# Patient Record
Sex: Female | Born: 1945
Health system: Southern US, Community
[De-identification: ages and names within clinical notes are randomized; demographics above are authoritative.]

## PROBLEM LIST (undated history)

## (undated) DIAGNOSIS — E559 Vitamin D deficiency, unspecified: Secondary | ICD-10-CM

## (undated) DIAGNOSIS — E119 Type 2 diabetes mellitus without complications: Secondary | ICD-10-CM

## (undated) DIAGNOSIS — I2510 Atherosclerotic heart disease of native coronary artery without angina pectoris: Secondary | ICD-10-CM

## (undated) DIAGNOSIS — G4733 Obstructive sleep apnea (adult) (pediatric): Secondary | ICD-10-CM

## (undated) DIAGNOSIS — E278 Other specified disorders of adrenal gland: Secondary | ICD-10-CM

## (undated) DIAGNOSIS — F329 Major depressive disorder, single episode, unspecified: Secondary | ICD-10-CM

## (undated) DIAGNOSIS — R0982 Postnasal drip: Secondary | ICD-10-CM

## (undated) DIAGNOSIS — J209 Acute bronchitis, unspecified: Secondary | ICD-10-CM

## (undated) DIAGNOSIS — J309 Allergic rhinitis, unspecified: Secondary | ICD-10-CM

## (undated) DIAGNOSIS — F419 Anxiety disorder, unspecified: Secondary | ICD-10-CM

## (undated) DIAGNOSIS — B0229 Other postherpetic nervous system involvement: Secondary | ICD-10-CM

## (undated) DIAGNOSIS — I10 Essential (primary) hypertension: Secondary | ICD-10-CM

## (undated) DIAGNOSIS — L659 Nonscarring hair loss, unspecified: Secondary | ICD-10-CM

## (undated) DIAGNOSIS — J84112 Idiopathic pulmonary fibrosis: Secondary | ICD-10-CM

## (undated) DIAGNOSIS — M1990 Unspecified osteoarthritis, unspecified site: Secondary | ICD-10-CM

## (undated) DIAGNOSIS — T8859XA Other complications of anesthesia, initial encounter: Secondary | ICD-10-CM

## (undated) DIAGNOSIS — R0609 Other forms of dyspnea: Secondary | ICD-10-CM

## (undated) DIAGNOSIS — M129 Arthropathy, unspecified: Secondary | ICD-10-CM

## (undated) DIAGNOSIS — K219 Gastro-esophageal reflux disease without esophagitis: Secondary | ICD-10-CM

## (undated) DIAGNOSIS — E785 Hyperlipidemia, unspecified: Secondary | ICD-10-CM

## (undated) DIAGNOSIS — J849 Interstitial pulmonary disease, unspecified: Secondary | ICD-10-CM

## (undated) DIAGNOSIS — I2721 Secondary pulmonary arterial hypertension: Secondary | ICD-10-CM

## (undated) DIAGNOSIS — I152 Hypertension secondary to endocrine disorders: Secondary | ICD-10-CM

## (undated) DIAGNOSIS — I700 Atherosclerosis of aorta: Secondary | ICD-10-CM

## (undated) DIAGNOSIS — R0902 Hypoxemia: Secondary | ICD-10-CM

## (undated) DIAGNOSIS — F32A Depression, unspecified: Secondary | ICD-10-CM

## (undated) DIAGNOSIS — E1159 Type 2 diabetes mellitus with other circulatory complications: Secondary | ICD-10-CM

## (undated) HISTORY — DX: Anxiety disorder, unspecified: F41.9

## (undated) HISTORY — DX: Other specified disorders of adrenal gland: E27.8

## (undated) HISTORY — DX: Essential (primary) hypertension: I10

## (undated) HISTORY — DX: Hyperlipidemia, unspecified: E78.5

## (undated) HISTORY — DX: Depression, unspecified: F32.A

## (undated) HISTORY — DX: Obstructive sleep apnea (adult) (pediatric): G47.33

## (undated) HISTORY — DX: Acute bronchitis, unspecified: J20.9

## (undated) HISTORY — DX: Allergic rhinitis, unspecified: J30.9

## (undated) HISTORY — DX: Nonscarring hair loss, unspecified: L65.9

## (undated) HISTORY — DX: Major depressive disorder, single episode, unspecified: F32.9

## (undated) HISTORY — DX: Postnasal drip: R09.82

## (undated) HISTORY — DX: Atherosclerotic heart disease of native coronary artery without angina pectoris: I25.10

## (undated) HISTORY — DX: Vitamin D deficiency, unspecified: E55.9

## (undated) HISTORY — PX: REPLACEMENT TOTAL KNEE: SUR1224

## (undated) HISTORY — DX: Arthropathy, unspecified: M12.9

## (undated) HISTORY — DX: Atherosclerosis of aorta: I70.0

## (undated) HISTORY — DX: Secondary pulmonary arterial hypertension: I27.21

---

## 1966-12-25 HISTORY — PX: ABDOMINAL HYSTERECTOMY: SHX81

## 1975-12-26 HISTORY — PX: TYMPANOPLASTY: SHX33

## 2007-06-21 ENCOUNTER — Ambulatory Visit: Payer: Self-pay | Admitting: Specialist

## 2007-06-21 ENCOUNTER — Other Ambulatory Visit: Payer: Self-pay

## 2007-07-03 ENCOUNTER — Inpatient Hospital Stay: Payer: Self-pay | Admitting: Specialist

## 2008-10-05 ENCOUNTER — Ambulatory Visit: Payer: Self-pay | Admitting: Gastroenterology

## 2008-12-10 ENCOUNTER — Encounter: Payer: Self-pay | Admitting: Specialist

## 2008-12-25 ENCOUNTER — Encounter: Payer: Self-pay | Admitting: Specialist

## 2009-01-25 ENCOUNTER — Encounter: Payer: Self-pay | Admitting: Specialist

## 2009-11-11 ENCOUNTER — Ambulatory Visit: Payer: Self-pay | Admitting: Internal Medicine

## 2011-09-05 ENCOUNTER — Ambulatory Visit: Payer: Self-pay | Admitting: Family Medicine

## 2011-10-09 ENCOUNTER — Ambulatory Visit: Payer: Self-pay | Admitting: Family Medicine

## 2012-01-22 DIAGNOSIS — E785 Hyperlipidemia, unspecified: Secondary | ICD-10-CM | POA: Diagnosis not present

## 2012-01-22 DIAGNOSIS — I10 Essential (primary) hypertension: Secondary | ICD-10-CM | POA: Diagnosis not present

## 2012-01-22 DIAGNOSIS — F329 Major depressive disorder, single episode, unspecified: Secondary | ICD-10-CM | POA: Diagnosis not present

## 2012-03-28 DIAGNOSIS — I10 Essential (primary) hypertension: Secondary | ICD-10-CM | POA: Diagnosis not present

## 2012-03-28 DIAGNOSIS — E785 Hyperlipidemia, unspecified: Secondary | ICD-10-CM | POA: Diagnosis not present

## 2012-07-03 DIAGNOSIS — R7301 Impaired fasting glucose: Secondary | ICD-10-CM | POA: Diagnosis not present

## 2012-07-03 DIAGNOSIS — E785 Hyperlipidemia, unspecified: Secondary | ICD-10-CM | POA: Diagnosis not present

## 2012-07-03 DIAGNOSIS — I10 Essential (primary) hypertension: Secondary | ICD-10-CM | POA: Diagnosis not present

## 2012-07-04 DIAGNOSIS — I10 Essential (primary) hypertension: Secondary | ICD-10-CM | POA: Diagnosis not present

## 2012-07-04 DIAGNOSIS — R7301 Impaired fasting glucose: Secondary | ICD-10-CM | POA: Diagnosis not present

## 2012-08-29 DIAGNOSIS — R5381 Other malaise: Secondary | ICD-10-CM | POA: Diagnosis not present

## 2012-08-29 DIAGNOSIS — I10 Essential (primary) hypertension: Secondary | ICD-10-CM | POA: Diagnosis not present

## 2012-10-03 DIAGNOSIS — Z0000 Encounter for general adult medical examination without abnormal findings: Secondary | ICD-10-CM | POA: Diagnosis not present

## 2012-10-03 DIAGNOSIS — F319 Bipolar disorder, unspecified: Secondary | ICD-10-CM | POA: Diagnosis not present

## 2012-10-03 DIAGNOSIS — F988 Other specified behavioral and emotional disorders with onset usually occurring in childhood and adolescence: Secondary | ICD-10-CM | POA: Diagnosis not present

## 2012-10-03 DIAGNOSIS — M1990 Unspecified osteoarthritis, unspecified site: Secondary | ICD-10-CM | POA: Diagnosis not present

## 2012-10-03 DIAGNOSIS — E785 Hyperlipidemia, unspecified: Secondary | ICD-10-CM | POA: Diagnosis not present

## 2012-10-03 DIAGNOSIS — F329 Major depressive disorder, single episode, unspecified: Secondary | ICD-10-CM | POA: Diagnosis not present

## 2012-10-03 DIAGNOSIS — I10 Essential (primary) hypertension: Secondary | ICD-10-CM | POA: Diagnosis not present

## 2012-10-04 ENCOUNTER — Ambulatory Visit: Payer: Self-pay | Admitting: Family Medicine

## 2012-10-04 DIAGNOSIS — Z1231 Encounter for screening mammogram for malignant neoplasm of breast: Secondary | ICD-10-CM | POA: Diagnosis not present

## 2013-01-27 DIAGNOSIS — L820 Inflamed seborrheic keratosis: Secondary | ICD-10-CM | POA: Diagnosis not present

## 2013-01-27 DIAGNOSIS — L259 Unspecified contact dermatitis, unspecified cause: Secondary | ICD-10-CM | POA: Diagnosis not present

## 2013-01-27 DIAGNOSIS — L219 Seborrheic dermatitis, unspecified: Secondary | ICD-10-CM | POA: Diagnosis not present

## 2013-05-07 DIAGNOSIS — D649 Anemia, unspecified: Secondary | ICD-10-CM | POA: Diagnosis not present

## 2013-05-07 DIAGNOSIS — G2581 Restless legs syndrome: Secondary | ICD-10-CM | POA: Diagnosis not present

## 2013-05-07 DIAGNOSIS — E785 Hyperlipidemia, unspecified: Secondary | ICD-10-CM | POA: Diagnosis not present

## 2013-05-07 DIAGNOSIS — I10 Essential (primary) hypertension: Secondary | ICD-10-CM | POA: Diagnosis not present

## 2013-06-09 DIAGNOSIS — G2589 Other specified extrapyramidal and movement disorders: Secondary | ICD-10-CM | POA: Diagnosis not present

## 2013-10-15 ENCOUNTER — Ambulatory Visit: Payer: Self-pay | Admitting: Family Medicine

## 2013-10-15 DIAGNOSIS — Z1231 Encounter for screening mammogram for malignant neoplasm of breast: Secondary | ICD-10-CM | POA: Diagnosis not present

## 2013-10-15 DIAGNOSIS — M259 Joint disorder, unspecified: Secondary | ICD-10-CM | POA: Diagnosis not present

## 2013-12-31 DIAGNOSIS — Z131 Encounter for screening for diabetes mellitus: Secondary | ICD-10-CM | POA: Diagnosis not present

## 2013-12-31 DIAGNOSIS — I10 Essential (primary) hypertension: Secondary | ICD-10-CM | POA: Diagnosis not present

## 2013-12-31 DIAGNOSIS — Z01419 Encounter for gynecological examination (general) (routine) without abnormal findings: Secondary | ICD-10-CM | POA: Diagnosis not present

## 2013-12-31 DIAGNOSIS — E782 Mixed hyperlipidemia: Secondary | ICD-10-CM | POA: Diagnosis not present

## 2013-12-31 DIAGNOSIS — E559 Vitamin D deficiency, unspecified: Secondary | ICD-10-CM | POA: Diagnosis not present

## 2013-12-31 DIAGNOSIS — Z1329 Encounter for screening for other suspected endocrine disorder: Secondary | ICD-10-CM | POA: Diagnosis not present

## 2014-03-12 ENCOUNTER — Ambulatory Visit: Payer: Self-pay | Admitting: Family Medicine

## 2014-03-12 DIAGNOSIS — G4733 Obstructive sleep apnea (adult) (pediatric): Secondary | ICD-10-CM | POA: Diagnosis not present

## 2014-03-17 DIAGNOSIS — G4733 Obstructive sleep apnea (adult) (pediatric): Secondary | ICD-10-CM | POA: Diagnosis not present

## 2014-03-30 ENCOUNTER — Ambulatory Visit: Payer: Self-pay | Admitting: Family Medicine

## 2014-03-30 DIAGNOSIS — M1710 Unilateral primary osteoarthritis, unspecified knee: Secondary | ICD-10-CM | POA: Diagnosis not present

## 2014-03-30 DIAGNOSIS — G4733 Obstructive sleep apnea (adult) (pediatric): Secondary | ICD-10-CM | POA: Diagnosis not present

## 2014-03-31 DIAGNOSIS — G4733 Obstructive sleep apnea (adult) (pediatric): Secondary | ICD-10-CM | POA: Diagnosis not present

## 2014-04-07 DIAGNOSIS — M1710 Unilateral primary osteoarthritis, unspecified knee: Secondary | ICD-10-CM | POA: Diagnosis not present

## 2014-05-21 DIAGNOSIS — M129 Arthropathy, unspecified: Secondary | ICD-10-CM | POA: Diagnosis not present

## 2014-05-21 DIAGNOSIS — F329 Major depressive disorder, single episode, unspecified: Secondary | ICD-10-CM | POA: Diagnosis not present

## 2014-05-21 DIAGNOSIS — F3289 Other specified depressive episodes: Secondary | ICD-10-CM | POA: Diagnosis not present

## 2014-05-21 DIAGNOSIS — I10 Essential (primary) hypertension: Secondary | ICD-10-CM | POA: Diagnosis not present

## 2014-05-21 DIAGNOSIS — G4730 Sleep apnea, unspecified: Secondary | ICD-10-CM | POA: Diagnosis not present

## 2014-07-14 DIAGNOSIS — M1710 Unilateral primary osteoarthritis, unspecified knee: Secondary | ICD-10-CM | POA: Diagnosis not present

## 2014-07-14 DIAGNOSIS — IMO0002 Reserved for concepts with insufficient information to code with codable children: Secondary | ICD-10-CM | POA: Diagnosis not present

## 2014-07-14 DIAGNOSIS — M25569 Pain in unspecified knee: Secondary | ICD-10-CM | POA: Diagnosis not present

## 2014-07-24 DIAGNOSIS — M1710 Unilateral primary osteoarthritis, unspecified knee: Secondary | ICD-10-CM | POA: Insufficient documentation

## 2014-07-24 DIAGNOSIS — IMO0002 Reserved for concepts with insufficient information to code with codable children: Secondary | ICD-10-CM | POA: Diagnosis not present

## 2014-07-24 DIAGNOSIS — M179 Osteoarthritis of knee, unspecified: Secondary | ICD-10-CM | POA: Insufficient documentation

## 2014-07-28 DIAGNOSIS — G4730 Sleep apnea, unspecified: Secondary | ICD-10-CM | POA: Diagnosis not present

## 2014-07-28 DIAGNOSIS — M129 Arthropathy, unspecified: Secondary | ICD-10-CM | POA: Diagnosis not present

## 2014-07-28 DIAGNOSIS — I10 Essential (primary) hypertension: Secondary | ICD-10-CM | POA: Diagnosis not present

## 2014-07-28 DIAGNOSIS — F411 Generalized anxiety disorder: Secondary | ICD-10-CM | POA: Diagnosis not present

## 2014-09-09 ENCOUNTER — Ambulatory Visit: Payer: Self-pay | Admitting: General Practice

## 2014-09-09 DIAGNOSIS — Z9889 Other specified postprocedural states: Secondary | ICD-10-CM | POA: Diagnosis not present

## 2014-09-09 DIAGNOSIS — I10 Essential (primary) hypertension: Secondary | ICD-10-CM | POA: Diagnosis not present

## 2014-09-09 DIAGNOSIS — K449 Diaphragmatic hernia without obstruction or gangrene: Secondary | ICD-10-CM | POA: Diagnosis not present

## 2014-09-09 DIAGNOSIS — Z889 Allergy status to unspecified drugs, medicaments and biological substances status: Secondary | ICD-10-CM | POA: Diagnosis not present

## 2014-09-09 DIAGNOSIS — Z01810 Encounter for preprocedural cardiovascular examination: Secondary | ICD-10-CM | POA: Diagnosis not present

## 2014-09-09 DIAGNOSIS — F3289 Other specified depressive episodes: Secondary | ICD-10-CM | POA: Diagnosis not present

## 2014-09-09 DIAGNOSIS — Z01812 Encounter for preprocedural laboratory examination: Secondary | ICD-10-CM | POA: Diagnosis not present

## 2014-09-09 DIAGNOSIS — E785 Hyperlipidemia, unspecified: Secondary | ICD-10-CM | POA: Diagnosis not present

## 2014-09-09 DIAGNOSIS — Z79899 Other long term (current) drug therapy: Secondary | ICD-10-CM | POA: Diagnosis not present

## 2014-09-09 DIAGNOSIS — E669 Obesity, unspecified: Secondary | ICD-10-CM | POA: Diagnosis not present

## 2014-09-09 DIAGNOSIS — IMO0002 Reserved for concepts with insufficient information to code with codable children: Secondary | ICD-10-CM | POA: Diagnosis not present

## 2014-09-09 DIAGNOSIS — F329 Major depressive disorder, single episode, unspecified: Secondary | ICD-10-CM | POA: Diagnosis not present

## 2014-09-09 LAB — BASIC METABOLIC PANEL
Anion Gap: 8 (ref 7–16)
BUN: 14 mg/dL (ref 7–18)
CHLORIDE: 104 mmol/L (ref 98–107)
CREATININE: 1 mg/dL (ref 0.60–1.30)
Calcium, Total: 9.2 mg/dL (ref 8.5–10.1)
Co2: 26 mmol/L (ref 21–32)
EGFR (Non-African Amer.): 58 — ABNORMAL LOW
GLUCOSE: 106 mg/dL — AB (ref 65–99)
Osmolality: 277 (ref 275–301)
Potassium: 4.1 mmol/L (ref 3.5–5.1)
SODIUM: 138 mmol/L (ref 136–145)

## 2014-09-09 LAB — MRSA PCR SCREENING

## 2014-09-09 LAB — URINALYSIS, COMPLETE
BLOOD: NEGATIVE
Bacteria: NONE SEEN
Bilirubin,UR: NEGATIVE
GLUCOSE, UR: NEGATIVE mg/dL (ref 0–75)
KETONE: NEGATIVE
Leukocyte Esterase: NEGATIVE
Nitrite: NEGATIVE
PROTEIN: NEGATIVE
Ph: 5 (ref 4.5–8.0)
RBC,UR: <1 /HPF (ref 0–5)
Specific Gravity: 1.018 (ref 1.003–1.030)
Squamous Epithelial: NONE SEEN

## 2014-09-09 LAB — PROTIME-INR
INR: 1
Prothrombin Time: 12.8 secs (ref 11.5–14.7)

## 2014-09-09 LAB — CBC
HCT: 40.7 % (ref 35.0–47.0)
HGB: 13.3 g/dL (ref 12.0–16.0)
MCH: 31.3 pg (ref 26.0–34.0)
MCHC: 32.6 g/dL (ref 32.0–36.0)
MCV: 96 fL (ref 80–100)
PLATELETS: 276 10*3/uL (ref 150–440)
RBC: 4.25 10*6/uL (ref 3.80–5.20)
RDW: 13.2 % (ref 11.5–14.5)
WBC: 5.4 10*3/uL (ref 3.6–11.0)

## 2014-09-09 LAB — APTT: Activated PTT: 26 secs (ref 23.6–35.9)

## 2014-09-09 LAB — SEDIMENTATION RATE: ERYTHROCYTE SED RATE: 25 mm/h (ref 0–30)

## 2014-09-10 LAB — URINE CULTURE

## 2014-09-23 ENCOUNTER — Inpatient Hospital Stay: Payer: Self-pay | Admitting: General Practice

## 2014-09-23 DIAGNOSIS — G4733 Obstructive sleep apnea (adult) (pediatric): Secondary | ICD-10-CM | POA: Diagnosis not present

## 2014-09-23 DIAGNOSIS — M1710 Unilateral primary osteoarthritis, unspecified knee: Secondary | ICD-10-CM | POA: Diagnosis not present

## 2014-09-23 DIAGNOSIS — Z96651 Presence of right artificial knee joint: Secondary | ICD-10-CM | POA: Diagnosis present

## 2014-09-23 DIAGNOSIS — E669 Obesity, unspecified: Secondary | ICD-10-CM | POA: Diagnosis not present

## 2014-09-23 DIAGNOSIS — Z471 Aftercare following joint replacement surgery: Secondary | ICD-10-CM | POA: Diagnosis not present

## 2014-09-23 DIAGNOSIS — F329 Major depressive disorder, single episode, unspecified: Secondary | ICD-10-CM | POA: Diagnosis not present

## 2014-09-23 DIAGNOSIS — D62 Acute posthemorrhagic anemia: Secondary | ICD-10-CM | POA: Diagnosis not present

## 2014-09-23 DIAGNOSIS — R2689 Other abnormalities of gait and mobility: Secondary | ICD-10-CM | POA: Diagnosis not present

## 2014-09-23 DIAGNOSIS — M179 Osteoarthritis of knee, unspecified: Secondary | ICD-10-CM | POA: Diagnosis present

## 2014-09-23 DIAGNOSIS — Z6838 Body mass index (BMI) 38.0-38.9, adult: Secondary | ICD-10-CM | POA: Diagnosis not present

## 2014-09-23 DIAGNOSIS — E785 Hyperlipidemia, unspecified: Secondary | ICD-10-CM | POA: Diagnosis not present

## 2014-09-23 DIAGNOSIS — Z96652 Presence of left artificial knee joint: Secondary | ICD-10-CM | POA: Diagnosis not present

## 2014-09-23 DIAGNOSIS — Z96659 Presence of unspecified artificial knee joint: Secondary | ICD-10-CM | POA: Diagnosis not present

## 2014-09-23 DIAGNOSIS — IMO0002 Reserved for concepts with insufficient information to code with codable children: Secondary | ICD-10-CM | POA: Diagnosis not present

## 2014-09-23 DIAGNOSIS — M6281 Muscle weakness (generalized): Secondary | ICD-10-CM | POA: Diagnosis not present

## 2014-09-23 DIAGNOSIS — I10 Essential (primary) hypertension: Secondary | ICD-10-CM | POA: Diagnosis not present

## 2014-09-23 DIAGNOSIS — K449 Diaphragmatic hernia without obstruction or gangrene: Secondary | ICD-10-CM | POA: Diagnosis present

## 2014-09-23 DIAGNOSIS — F419 Anxiety disorder, unspecified: Secondary | ICD-10-CM | POA: Diagnosis not present

## 2014-09-23 DIAGNOSIS — Z9989 Dependence on other enabling machines and devices: Secondary | ICD-10-CM | POA: Diagnosis not present

## 2014-09-23 DIAGNOSIS — G4730 Sleep apnea, unspecified: Secondary | ICD-10-CM | POA: Diagnosis present

## 2014-09-24 LAB — BASIC METABOLIC PANEL
Anion Gap: 6 — ABNORMAL LOW (ref 7–16)
BUN: 14 mg/dL (ref 7–18)
CO2: 28 mmol/L (ref 21–32)
Calcium, Total: 8.5 mg/dL (ref 8.5–10.1)
Chloride: 103 mmol/L (ref 98–107)
Creatinine: 1.27 mg/dL (ref 0.60–1.30)
EGFR (Non-African Amer.): 44 — ABNORMAL LOW
GFR CALC AF AMER: 54 — AB
Glucose: 135 mg/dL — ABNORMAL HIGH (ref 65–99)
Osmolality: 276 (ref 275–301)
Potassium: 4.4 mmol/L (ref 3.5–5.1)
SODIUM: 137 mmol/L (ref 136–145)

## 2014-09-24 LAB — HEMOGLOBIN: HGB: 11.6 g/dL — ABNORMAL LOW (ref 12.0–16.0)

## 2014-09-24 LAB — PLATELET COUNT: Platelet: 248 10*3/uL (ref 150–440)

## 2014-09-25 LAB — BASIC METABOLIC PANEL
ANION GAP: 3 — AB (ref 7–16)
BUN: 8 mg/dL (ref 7–18)
CHLORIDE: 103 mmol/L (ref 98–107)
Calcium, Total: 8.2 mg/dL — ABNORMAL LOW (ref 8.5–10.1)
Co2: 32 mmol/L (ref 21–32)
Creatinine: 1.06 mg/dL (ref 0.60–1.30)
EGFR (African American): >60
GFR CALC NON AF AMER: 55 — AB
GLUCOSE: 124 mg/dL — AB (ref 65–99)
Osmolality: 275 (ref 275–301)
POTASSIUM: 4.3 mmol/L (ref 3.5–5.1)
Sodium: 138 mmol/L (ref 136–145)

## 2014-09-25 LAB — PLATELET COUNT: Platelet: 214 10*3/uL (ref 150–440)

## 2014-09-25 LAB — HEMOGLOBIN: HGB: 10.2 g/dL — ABNORMAL LOW (ref 12.0–16.0)

## 2014-09-26 DIAGNOSIS — E785 Hyperlipidemia, unspecified: Secondary | ICD-10-CM | POA: Diagnosis not present

## 2014-09-26 DIAGNOSIS — R2689 Other abnormalities of gait and mobility: Secondary | ICD-10-CM | POA: Diagnosis not present

## 2014-09-26 DIAGNOSIS — Z96652 Presence of left artificial knee joint: Secondary | ICD-10-CM | POA: Diagnosis not present

## 2014-09-26 DIAGNOSIS — M79662 Pain in left lower leg: Secondary | ICD-10-CM | POA: Diagnosis not present

## 2014-09-26 DIAGNOSIS — M7989 Other specified soft tissue disorders: Secondary | ICD-10-CM | POA: Diagnosis not present

## 2014-09-26 DIAGNOSIS — F419 Anxiety disorder, unspecified: Secondary | ICD-10-CM | POA: Diagnosis not present

## 2014-09-26 DIAGNOSIS — M1990 Unspecified osteoarthritis, unspecified site: Secondary | ICD-10-CM | POA: Diagnosis not present

## 2014-09-26 DIAGNOSIS — Z471 Aftercare following joint replacement surgery: Secondary | ICD-10-CM | POA: Diagnosis not present

## 2014-09-26 DIAGNOSIS — E669 Obesity, unspecified: Secondary | ICD-10-CM | POA: Diagnosis not present

## 2014-09-26 DIAGNOSIS — F329 Major depressive disorder, single episode, unspecified: Secondary | ICD-10-CM | POA: Diagnosis not present

## 2014-09-26 DIAGNOSIS — M6281 Muscle weakness (generalized): Secondary | ICD-10-CM | POA: Diagnosis not present

## 2014-09-26 DIAGNOSIS — M25562 Pain in left knee: Secondary | ICD-10-CM | POA: Diagnosis not present

## 2014-09-26 DIAGNOSIS — I10 Essential (primary) hypertension: Secondary | ICD-10-CM | POA: Diagnosis not present

## 2014-09-26 DIAGNOSIS — G4733 Obstructive sleep apnea (adult) (pediatric): Secondary | ICD-10-CM | POA: Diagnosis not present

## 2014-09-26 DIAGNOSIS — Z9989 Dependence on other enabling machines and devices: Secondary | ICD-10-CM | POA: Diagnosis not present

## 2014-09-27 ENCOUNTER — Encounter: Payer: Self-pay | Admitting: Internal Medicine

## 2014-09-28 DIAGNOSIS — M1990 Unspecified osteoarthritis, unspecified site: Secondary | ICD-10-CM | POA: Diagnosis not present

## 2014-09-28 DIAGNOSIS — I10 Essential (primary) hypertension: Secondary | ICD-10-CM | POA: Diagnosis not present

## 2014-09-28 DIAGNOSIS — G4733 Obstructive sleep apnea (adult) (pediatric): Secondary | ICD-10-CM | POA: Diagnosis not present

## 2014-10-07 ENCOUNTER — Ambulatory Visit: Payer: Self-pay | Admitting: Gerontology

## 2014-10-07 DIAGNOSIS — M7989 Other specified soft tissue disorders: Secondary | ICD-10-CM | POA: Diagnosis not present

## 2014-10-07 DIAGNOSIS — M25562 Pain in left knee: Secondary | ICD-10-CM | POA: Diagnosis not present

## 2014-10-08 DIAGNOSIS — Z96659 Presence of unspecified artificial knee joint: Secondary | ICD-10-CM | POA: Insufficient documentation

## 2014-10-12 DIAGNOSIS — Z96652 Presence of left artificial knee joint: Secondary | ICD-10-CM | POA: Diagnosis not present

## 2014-10-14 DIAGNOSIS — Z96652 Presence of left artificial knee joint: Secondary | ICD-10-CM | POA: Diagnosis not present

## 2014-10-16 DIAGNOSIS — Z96652 Presence of left artificial knee joint: Secondary | ICD-10-CM | POA: Diagnosis not present

## 2014-10-19 DIAGNOSIS — Z96652 Presence of left artificial knee joint: Secondary | ICD-10-CM | POA: Diagnosis not present

## 2014-10-21 DIAGNOSIS — Z96652 Presence of left artificial knee joint: Secondary | ICD-10-CM | POA: Diagnosis not present

## 2014-10-23 DIAGNOSIS — Z96652 Presence of left artificial knee joint: Secondary | ICD-10-CM | POA: Diagnosis not present

## 2014-10-25 ENCOUNTER — Encounter: Payer: Self-pay | Admitting: Internal Medicine

## 2014-10-26 DIAGNOSIS — Z96652 Presence of left artificial knee joint: Secondary | ICD-10-CM | POA: Diagnosis not present

## 2014-10-28 DIAGNOSIS — Z96652 Presence of left artificial knee joint: Secondary | ICD-10-CM | POA: Diagnosis not present

## 2014-10-30 DIAGNOSIS — Z96652 Presence of left artificial knee joint: Secondary | ICD-10-CM | POA: Diagnosis not present

## 2014-11-02 DIAGNOSIS — Z96652 Presence of left artificial knee joint: Secondary | ICD-10-CM | POA: Diagnosis not present

## 2014-11-04 DIAGNOSIS — Z96652 Presence of left artificial knee joint: Secondary | ICD-10-CM | POA: Diagnosis not present

## 2014-11-05 DIAGNOSIS — Z96652 Presence of left artificial knee joint: Secondary | ICD-10-CM | POA: Diagnosis not present

## 2014-11-09 DIAGNOSIS — Z96652 Presence of left artificial knee joint: Secondary | ICD-10-CM | POA: Diagnosis not present

## 2014-11-11 DIAGNOSIS — Z96652 Presence of left artificial knee joint: Secondary | ICD-10-CM | POA: Diagnosis not present

## 2014-11-16 DIAGNOSIS — Z96652 Presence of left artificial knee joint: Secondary | ICD-10-CM | POA: Diagnosis not present

## 2014-11-18 DIAGNOSIS — Z96652 Presence of left artificial knee joint: Secondary | ICD-10-CM | POA: Diagnosis not present

## 2014-11-23 DIAGNOSIS — Z96652 Presence of left artificial knee joint: Secondary | ICD-10-CM | POA: Diagnosis not present

## 2014-11-24 ENCOUNTER — Encounter: Payer: Self-pay | Admitting: Internal Medicine

## 2014-11-27 DIAGNOSIS — Z96652 Presence of left artificial knee joint: Secondary | ICD-10-CM | POA: Diagnosis not present

## 2014-11-30 DIAGNOSIS — Z96652 Presence of left artificial knee joint: Secondary | ICD-10-CM | POA: Diagnosis not present

## 2014-12-07 DIAGNOSIS — Z96652 Presence of left artificial knee joint: Secondary | ICD-10-CM | POA: Diagnosis not present

## 2014-12-14 DIAGNOSIS — Z96652 Presence of left artificial knee joint: Secondary | ICD-10-CM | POA: Diagnosis not present

## 2014-12-16 DIAGNOSIS — Z96652 Presence of left artificial knee joint: Secondary | ICD-10-CM | POA: Diagnosis not present

## 2014-12-25 ENCOUNTER — Encounter: Payer: Self-pay | Admitting: Internal Medicine

## 2015-01-08 ENCOUNTER — Emergency Department: Payer: Self-pay | Admitting: Emergency Medicine

## 2015-01-08 DIAGNOSIS — R0789 Other chest pain: Secondary | ICD-10-CM | POA: Diagnosis not present

## 2015-01-08 DIAGNOSIS — J029 Acute pharyngitis, unspecified: Secondary | ICD-10-CM | POA: Diagnosis not present

## 2015-01-08 DIAGNOSIS — R0602 Shortness of breath: Secondary | ICD-10-CM | POA: Diagnosis not present

## 2015-01-08 DIAGNOSIS — J8410 Pulmonary fibrosis, unspecified: Secondary | ICD-10-CM | POA: Diagnosis not present

## 2015-01-08 DIAGNOSIS — H6691 Otitis media, unspecified, right ear: Secondary | ICD-10-CM | POA: Diagnosis not present

## 2015-01-08 DIAGNOSIS — R079 Chest pain, unspecified: Secondary | ICD-10-CM | POA: Diagnosis not present

## 2015-01-08 DIAGNOSIS — J358 Other chronic diseases of tonsils and adenoids: Secondary | ICD-10-CM | POA: Diagnosis not present

## 2015-01-08 DIAGNOSIS — I10 Essential (primary) hypertension: Secondary | ICD-10-CM | POA: Diagnosis not present

## 2015-01-08 DIAGNOSIS — J9811 Atelectasis: Secondary | ICD-10-CM | POA: Diagnosis not present

## 2015-01-08 LAB — BASIC METABOLIC PANEL
ANION GAP: 8 (ref 7–16)
BUN: 13 mg/dL (ref 7–18)
Calcium, Total: 8.9 mg/dL (ref 8.5–10.1)
Chloride: 103 mmol/L (ref 98–107)
Co2: 26 mmol/L (ref 21–32)
Creatinine: 1.26 mg/dL (ref 0.60–1.30)
EGFR (Non-African Amer.): 45 — ABNORMAL LOW
GFR CALC AF AMER: 54 — AB
Glucose: 121 mg/dL — ABNORMAL HIGH (ref 65–99)
Osmolality: 275 (ref 275–301)
Potassium: 4.2 mmol/L (ref 3.5–5.1)
SODIUM: 137 mmol/L (ref 136–145)

## 2015-01-08 LAB — CBC
HCT: 39.1 % (ref 35.0–47.0)
HGB: 12.5 g/dL (ref 12.0–16.0)
MCH: 29.5 pg (ref 26.0–34.0)
MCHC: 32.1 g/dL (ref 32.0–36.0)
MCV: 92 fL (ref 80–100)
Platelet: 280 10*3/uL (ref 150–440)
RBC: 4.26 10*6/uL (ref 3.80–5.20)
RDW: 13.8 % (ref 11.5–14.5)
WBC: 11.5 10*3/uL — ABNORMAL HIGH (ref 3.6–11.0)

## 2015-01-08 LAB — TROPONIN I

## 2015-01-09 LAB — TROPONIN I

## 2015-02-11 DIAGNOSIS — F419 Anxiety disorder, unspecified: Secondary | ICD-10-CM | POA: Diagnosis not present

## 2015-02-11 DIAGNOSIS — J309 Allergic rhinitis, unspecified: Secondary | ICD-10-CM | POA: Diagnosis not present

## 2015-02-11 DIAGNOSIS — E279 Disorder of adrenal gland, unspecified: Secondary | ICD-10-CM | POA: Diagnosis not present

## 2015-02-11 DIAGNOSIS — F329 Major depressive disorder, single episode, unspecified: Secondary | ICD-10-CM | POA: Diagnosis not present

## 2015-02-11 DIAGNOSIS — R0982 Postnasal drip: Secondary | ICD-10-CM | POA: Diagnosis not present

## 2015-02-11 DIAGNOSIS — L659 Nonscarring hair loss, unspecified: Secondary | ICD-10-CM | POA: Diagnosis not present

## 2015-02-11 DIAGNOSIS — Z1211 Encounter for screening for malignant neoplasm of colon: Secondary | ICD-10-CM | POA: Diagnosis not present

## 2015-02-11 DIAGNOSIS — Z1239 Encounter for other screening for malignant neoplasm of breast: Secondary | ICD-10-CM | POA: Diagnosis not present

## 2015-02-11 DIAGNOSIS — I10 Essential (primary) hypertension: Secondary | ICD-10-CM | POA: Diagnosis not present

## 2015-02-11 DIAGNOSIS — E785 Hyperlipidemia, unspecified: Secondary | ICD-10-CM | POA: Diagnosis not present

## 2015-02-25 ENCOUNTER — Ambulatory Visit: Payer: Self-pay | Admitting: Family Medicine

## 2015-02-25 DIAGNOSIS — Z1231 Encounter for screening mammogram for malignant neoplasm of breast: Secondary | ICD-10-CM | POA: Diagnosis not present

## 2015-03-05 ENCOUNTER — Ambulatory Visit: Payer: Self-pay | Admitting: Gastroenterology

## 2015-03-05 DIAGNOSIS — K5790 Diverticulosis of intestine, part unspecified, without perforation or abscess without bleeding: Secondary | ICD-10-CM | POA: Diagnosis not present

## 2015-03-05 DIAGNOSIS — K648 Other hemorrhoids: Secondary | ICD-10-CM | POA: Diagnosis not present

## 2015-03-05 DIAGNOSIS — F418 Other specified anxiety disorders: Secondary | ICD-10-CM | POA: Diagnosis not present

## 2015-03-05 DIAGNOSIS — E785 Hyperlipidemia, unspecified: Secondary | ICD-10-CM | POA: Diagnosis not present

## 2015-03-05 DIAGNOSIS — Z888 Allergy status to other drugs, medicaments and biological substances status: Secondary | ICD-10-CM | POA: Diagnosis not present

## 2015-03-05 DIAGNOSIS — Z885 Allergy status to narcotic agent status: Secondary | ICD-10-CM | POA: Diagnosis not present

## 2015-03-05 DIAGNOSIS — D126 Benign neoplasm of colon, unspecified: Secondary | ICD-10-CM | POA: Diagnosis not present

## 2015-03-05 DIAGNOSIS — Z1211 Encounter for screening for malignant neoplasm of colon: Secondary | ICD-10-CM | POA: Diagnosis not present

## 2015-03-05 DIAGNOSIS — L659 Nonscarring hair loss, unspecified: Secondary | ICD-10-CM | POA: Diagnosis not present

## 2015-03-05 DIAGNOSIS — I10 Essential (primary) hypertension: Secondary | ICD-10-CM | POA: Diagnosis not present

## 2015-03-05 DIAGNOSIS — J309 Allergic rhinitis, unspecified: Secondary | ICD-10-CM | POA: Diagnosis not present

## 2015-03-05 DIAGNOSIS — Z86010 Personal history of colonic polyps: Secondary | ICD-10-CM | POA: Diagnosis not present

## 2015-03-05 DIAGNOSIS — D122 Benign neoplasm of ascending colon: Secondary | ICD-10-CM | POA: Diagnosis not present

## 2015-04-06 ENCOUNTER — Ambulatory Visit
Admit: 2015-04-06 | Disposition: A | Discharge status: Home / Self Care | Payer: Self-pay | Attending: Family Medicine | Admitting: Family Medicine

## 2015-04-06 DIAGNOSIS — J209 Acute bronchitis, unspecified: Secondary | ICD-10-CM | POA: Diagnosis not present

## 2015-04-06 DIAGNOSIS — R05 Cough: Secondary | ICD-10-CM | POA: Diagnosis not present

## 2015-04-17 NOTE — Discharge Summary (Signed)
PATIENT NAME:  Taylor Barry, BRENNER MR#:  [id number] DATE OF BIRTH:  07-Sep-1946  DATE OF ADMISSION:  09/23/2014 DATE OF DISCHARGE:  09/26/2014  ADMITTING DIAGNOSIS: Degenerative arthrosis of the left knee.   DISCHARGE DIAGNOSIS: Degenerative arthrosis of the right knee.   OPERATION: On 09/23/2014, the patient had a left total knee arthroplasty using computer-aided navigation.   SURGEON: Laurice Record. Holley Bouche, MD.   ASSISTANT: Vance Peper, PA.   ANESTHESIA: General.   ESTIMATED BLOOD LOSS: 50 mL.   IMPLANTS USED: DePuy PFC Sigma size 3 posterior stabilized femoral component that was cemented, size 3 MBT tibial component (cemented), 35-mm 3-peg oval dome patella that was cemented, and a 10-mm stabilized rotating platform polyethylene insert.   The patient was stabilized, brought to the recovery room, and then brought down to the orthopedic floor.   HISTORY: The patient is a 69 year old female who presented for an upcoming right total knee replacement. The patient has been refractory to conservative treatment with activity modification, cortisone injections with no relief.   PHYSICAL EXAMINATION:  GENERAL: Alert female with an antalgic gait and varus thrust to the left knee.  CARDIOVASCULAR: Regular rate and rhythm.  LUNGS: Clear to auscultation.  MUSCULOSKELETAL: In regard to the left knee the patient has a mild swelling. The patient has a varus abnormality. The patient has full extension to 113 degrees of flexion.  NEUROLOGIC: Essentially intact.   HOSPITAL COURSE: After initial admission on 09/23/2014, the patient had labs drawn with a hemoglobin of 11.6 on postoperative day 1 and down to 10.2 on postoperative day 2 with no transfusion given. The patient did receive over 400 mL of Autovac transfusion. The patient worked with physical therapy, initially bed to chair and progressed around the room. The patient was ready to go rehabilitation on 09/26/2014.   CONDITION AT DISCHARGE: Stable.    DISPOSITION: The patient was sent to rehabilitation.  DISCHARGE INSTRUCTIONS:  The patient will follow up at [facility] in 2 weeks. The patient will work with physical therapy and occupational therapy per protocol. The patient will do weight-bear as tolerated and elevate her leg with 1-2 pillows using thigh-high TED hose on both legs. The patient will do incentive spirometer and be encouraged to do cough and deep breathing.  The patient's diet is regular. The patient will do physical therapy and occupational therapy per protocol. The rehabilitation center will call if there is any bleeding or bowel or bladder difficulty.  The patient will use Polar Care to decrease swelling. The patient's diet is regular. The patient will have dressing changes on a p.r.n. basis and if it gets wet or dirty will have it changed, as well.   DISCHARGE MEDICATIONS:  Hydrochlorothiazide 1 tablet daily, simvastatin 10 mg 1 tablet daily, citalopram 10 mg 1 tablet daily, Ambien 10 mg 1 tablet at bedtime as needed, Tylenol 500, 1 tablet q. 4 hours as needed for pain or fever, Tramadol 50 mg 1 tablet q. 4 hours as needed for moderate pain, Nucynta 50 mg 1 tablet q. 4 hours as needed for moderate to severe pain, milk of magnesia 30 mL b.i.d. Lovenox 40 mg subcutaneous once a day, discontinue and begin aspirin 81 mg once a day. Senokot-S 1 tablet p.o. b.i.d.    ____________________________ J. Reche Dixon, [place] DICTATED FOR:  Laurice Record. Holley Bouche., MD jtm:lt D: 09/25/2014 08:58:00 ET T: 09/25/2014 09:26:16 ET JOB#: [id number] J Brailynn Breth PA ELECTRONICALLY SIGNED 10/05/2014 8:27

## 2015-04-17 NOTE — Op Note (Signed)
PATIENT NAME:  Taylor Barry, Taylor Barry MR#:  [id number] DATE OF BIRTH:  13-Nov-1946  DATE OF PROCEDURE:  09/23/2014  PREOPERATIVE DIAGNOSIS: Degenerative arthrosis of the left knee.   POSTOPERATIVE DIAGNOSIS: Degenerative arthrosis of the left knee.   PROCEDURE PERFORMED: Left total knee arthroplasty using computer-assisted navigation.   SURGEON: Laurice Record. Holley Bouche., MD.   ASSISTANTMarland Kitchen Vance Peper, PA (required to maintain retraction throughout the procedure).   ANESTHESIA: General.   ESTIMATED BLOOD LOSS: 50 mL.   FLUIDS REPLACED: 1400 mL of crystalloid.   TOURNIQUET TIME: 70 minutes.   DRAINS: Two medium drains to reinfusion system.   SOFT TISSUE RELEASES: Anterior cruciate ligament, posterior cruciate ligament, deep medial collateral ligament and patellofemoral ligament.   IMPLANTS UTILIZED: DePuy PFC Sigma size 3 posterior stabilized femoral component (cemented), size 3 MBT tibial component (cemented), 35 mm 3 peg oval dome patella (cemented) and a 10 mm stabilized rotating platform polyethylene insert.   INDICATIONS FOR SURGERY: The patient is a 69 year old female who has been seen for complaints of progressive left knee pain. X-rays demonstrated degenerative changes in a tricompartmental fashion with relative varus deformity. After a discussion of the risks and benefits of surgical intervention, the patient expressed understanding of the risks and benefits and agreed with plans for surgical intervention.   PROCEDURE IN DETAIL: The patient was brought into the operating room and after adequate general anesthesia was achieved after a failed attempt at spinal anesthesia, a tourniquet was placed on the patient's upper left thigh. The patient's left knee and leg were cleaned and prepped with alcohol and DuraPrep and draped in the usual sterile fashion. A "timeout" was performed as per usual protocol. The left lower extremity was exsanguinated using an Esmarch and the tourniquet was inflated to 300  mmHg. An anterior longitudinal incision was made followed by a standard mid vastus approach. A moderate effusion was evacuated. The deep fibers of the medial collateral ligament were elevated in a subperiosteal fashion off the medial flare of the tibia so as to maintain a continuous soft tissue sleeve. The patella was subluxed laterally and the patellofemoral ligament was incised.   Inspection of the knee demonstrated severe degenerative changes in tricompartmental fashion with prominent osteophytes and significant full-thickness loss of articular cartilage to the medial compartment. Prominent osteophytes were debrided. Anterior and posterior cruciate ligaments were excised. Two 4.0 mm Schanz pins were inserted into the femur and into the tibia for attachment of the array of trackers used for computer-assisted navigation. Hip center was identified using a circumduction technique. Distal landmarks were mapped using the computer. The distal femur and proximal tibia were mapped using the computer. Distal femoral cutting guide was positioned using computer-assisted navigation so as to achieve a 5 degree distal valgus cut. Cut was performed and verified using the computer. Distal femur was sized and it was felt that a size 3 femoral component was appropriate.   A size 3 cutting guide was positioned and an anterior cut was performed and verified using the computer. This was followed by completion of the posterior and chamfer cuts. Femoral cutting guide for the central box was then positioned and the central box cut was performed. Attention was then directed to the proximal tibia. Medial and lateral menisci were excised. The extramedullary tibial cutting guide was positioned using computer-assisted navigation so as to achieve 0 degree varus valgus alignment and 0 degrees posterior slope. Cut was performed and verified using the computer. The proximal tibia was sized and it was felt that  a size 3 tibial tray was  appropriate. Tibial and femoral trials were inserted, followed by insertion of a 10 mm polyethylene trial. Good medial and lateral soft tissue balancing was appreciated both in full extension and in flexion.   Finally, the patella was cut and prepared so as to accommodate a 35 mm 3 peg oval dome patella. Patellar trial was placed and the knee was placed through a range of motion with excellent patellar tracking noted. The femoral trial was removed. Central post hole for the tibial component was reamed followed by insertion of a keel punch. Tibial trials were then removed. The cut surfaces of bone were irrigated with copious amounts of normal saline with antibiotic solution using pulsatile lavage and then suctioned dry. Polymethylmethacrylate cement was prepared in usual fashion using a vacuum mixer. Cement was applied to the cut surface of the proximal tibia as well as along the undersurface of a size 3 MBT tibial component. The tibial component was positioned and impacted into place. Excess cement was removed using [employer] fast streamer.   Cement was then applied to the cut surface of the femur as well as on the posterior flanges of the size 3 posterior stabilized femoral component. The femoral component was positioned and impacted into place. Excess cement was removed using [employer] fast streamer. A 10 mm polyethylene trial was inserted and the knee was brought into full extension with steady axial compression applied. Finally, cement was applied to the backside of a 35 mm 3 peg oval dome patella and the patella component was positioned and patellar clamp applied. Excess cement was removed using [employer] fast streamer. After adequate curing of cement, the tourniquet was deflated after a total tourniquet time of 70 minutes. Hemostasis was achieved using electrocautery. The knee was irrigated with copious amounts of normal saline with antibiotic solution using pulsatile lavage and then suctioned dry.   The knee was inspected for  any residual cement debris and 20 mL of 1.3% Exparel in 40 mL of normal saline was injected along the posterior capsule, medial and lateral recesses and along the arthrotomy site. A 10 mm stabilized rotating platform polyethylene insert was inserted and the knee was placed through a range of motion with excellent mediolateral soft tissue balancing both in flexion and in extension. Excellent patellar tracking was appreciated. Two medium drains were placed in the wound bed and brought out through a separate stab incision to be attached to a reinfusion system. The medial parapatellar portion of the incision was reapproximated using interrupted sutures of #1 Vicryl and 30 mL of 0.25% Marcaine with epinephrine was injected into the subcutaneous tissue in line with the surgical incision. Subcutaneous tissue was then approximated in layers using first #0 Vicryl followed by 2-0 Vicryl. The skin was closed with skin staples. Sterile dressing was applied. The patient tolerated the procedure well. She was transported to the recovery room in stable condition.     ____________________________ Laurice Record. Holley Bouche., MD jph:TT D: 09/23/2014 18:48:13 ET T: 09/23/2014 19:34:42 ET JOB#: [id number]  cc: Laurice Record. Holley Bouche., MD, <Dictator> JAMES P Holley Bouche MD ELECTRONICALLY SIGNED 09/24/2014 23:14

## 2015-05-25 DIAGNOSIS — Z96652 Presence of left artificial knee joint: Secondary | ICD-10-CM | POA: Diagnosis not present

## 2015-07-28 ENCOUNTER — Ambulatory Visit (INDEPENDENT_AMBULATORY_CARE_PROVIDER_SITE_OTHER): Payer: Medicare Other | Admitting: Family Medicine

## 2015-07-28 ENCOUNTER — Encounter: Payer: Self-pay | Admitting: Family Medicine

## 2015-07-28 ENCOUNTER — Other Ambulatory Visit: Payer: Self-pay | Admitting: Family Medicine

## 2015-07-28 VITALS — BP 116/70 | HR 92 | Temp 98.1°F | Resp 16 | Ht 66.0 in | Wt 248.2 lb

## 2015-07-28 DIAGNOSIS — R0982 Postnasal drip: Secondary | ICD-10-CM | POA: Diagnosis not present

## 2015-07-28 DIAGNOSIS — R7309 Other abnormal glucose: Secondary | ICD-10-CM

## 2015-07-28 DIAGNOSIS — E785 Hyperlipidemia, unspecified: Secondary | ICD-10-CM | POA: Insufficient documentation

## 2015-07-28 DIAGNOSIS — J309 Allergic rhinitis, unspecified: Secondary | ICD-10-CM | POA: Insufficient documentation

## 2015-07-28 DIAGNOSIS — R7303 Prediabetes: Secondary | ICD-10-CM | POA: Insufficient documentation

## 2015-07-28 DIAGNOSIS — F418 Other specified anxiety disorders: Secondary | ICD-10-CM | POA: Diagnosis not present

## 2015-07-28 DIAGNOSIS — F419 Anxiety disorder, unspecified: Secondary | ICD-10-CM | POA: Insufficient documentation

## 2015-07-28 DIAGNOSIS — I10 Essential (primary) hypertension: Secondary | ICD-10-CM | POA: Diagnosis not present

## 2015-07-28 DIAGNOSIS — E559 Vitamin D deficiency, unspecified: Secondary | ICD-10-CM

## 2015-07-28 DIAGNOSIS — J45909 Unspecified asthma, uncomplicated: Secondary | ICD-10-CM | POA: Diagnosis not present

## 2015-07-28 DIAGNOSIS — D126 Benign neoplasm of colon, unspecified: Secondary | ICD-10-CM | POA: Insufficient documentation

## 2015-07-28 DIAGNOSIS — F329 Major depressive disorder, single episode, unspecified: Secondary | ICD-10-CM | POA: Insufficient documentation

## 2015-07-28 DIAGNOSIS — R5383 Other fatigue: Secondary | ICD-10-CM | POA: Diagnosis not present

## 2015-07-28 DIAGNOSIS — B349 Viral infection, unspecified: Secondary | ICD-10-CM | POA: Diagnosis not present

## 2015-07-28 DIAGNOSIS — L659 Nonscarring hair loss, unspecified: Secondary | ICD-10-CM | POA: Insufficient documentation

## 2015-07-28 DIAGNOSIS — E1159 Type 2 diabetes mellitus with other circulatory complications: Secondary | ICD-10-CM | POA: Insufficient documentation

## 2015-07-28 DIAGNOSIS — M159 Polyosteoarthritis, unspecified: Secondary | ICD-10-CM | POA: Insufficient documentation

## 2015-07-28 DIAGNOSIS — E278 Other specified disorders of adrenal gland: Secondary | ICD-10-CM | POA: Insufficient documentation

## 2015-07-28 DIAGNOSIS — E782 Mixed hyperlipidemia: Secondary | ICD-10-CM | POA: Insufficient documentation

## 2015-07-28 DIAGNOSIS — G4733 Obstructive sleep apnea (adult) (pediatric): Secondary | ICD-10-CM | POA: Insufficient documentation

## 2015-07-28 MED ORDER — LOSARTAN POTASSIUM-HCTZ 100-12.5 MG PO TABS: 1.0000 | ORAL_TABLET | Freq: Every day | ORAL | Status: DC

## 2015-07-28 MED ORDER — SIMVASTATIN 10 MG PO TABS: 10.0000 mg | ORAL_TABLET | Freq: Every day | ORAL | Status: DC

## 2015-07-28 MED ORDER — CITALOPRAM HYDROBROMIDE 40 MG PO TABS: 40.0000 mg | ORAL_TABLET | Freq: Every day | ORAL | Status: DC

## 2015-07-28 NOTE — Patient Instructions (Signed)
Sinus Headache °A sinus headache is when your sinuses become clogged or swollen. Sinus headaches can range from mild to severe.  °CAUSES °A sinus headache can have different causes, such as: °· Colds. °· Sinus infections. °· Allergies. °SYMPTOMS  °Symptoms of a sinus headache may vary and can include: °· Headache. °· Pain or pressure in the face. °· Congested or runny nose. °· Fever. °· Inability to smell. °· Pain in upper teeth. °Weather changes can make symptoms worse. °TREATMENT  °The treatment of a sinus headache depends on the cause. °· Sinus pain caused by a sinus infection may be treated with antibiotic medicine. °· Sinus pain caused by allergies may be helped by allergy medicines (antihistamines) and medicated nasal sprays. °· Sinus pain caused by congestion may be helped by flushing the nose and sinuses with saline solution. °HOME CARE INSTRUCTIONS  °· If antibiotics are prescribed, take them as directed. Finish them even if you start to feel better. °· Only take over-the-counter or prescription medicines for pain, discomfort, or fever as directed by your caregiver. °· If you have congestion, use a nasal spray to help reduce pressure. °SEEK IMMEDIATE MEDICAL CARE IF: °· You have a fever. °· You have headaches more than once a week. °· You have sensitivity to light or sound. °· You have repeated nausea and vomiting. °· You have vision problems. °· You have sudden, severe pain in your face or head. °· You have a seizure. °· You are confused. °· Your sinus headaches do not get better after treatment. Many people think they have a sinus headache when they actually have migraines or tension headaches. °MAKE SURE YOU:  °· Understand these instructions. °· Will watch your condition. °· Will get help right away if you are not doing well or get worse. °Document Released: 01/18/2005 Document Revised: 03/04/2012 Document Reviewed: 03/11/2011 °ExitCare® Patient Information ©2015 [employer]. This information is not  intended to replace advice given to you by your health care provider. Make sure you discuss any questions you have with your health care provider. ° °

## 2015-07-28 NOTE — Addendum Note (Signed)
Addended by: Bobetta Lime on: 07/28/2015 10:58 AM   Modules accepted: Orders, Medications, SmartSet

## 2015-07-28 NOTE — Progress Notes (Addendum)
Name: Taylor Barry   MRN: [id number]    DOB: [birth year]   Date:07/28/2015       Progress Note  Subjective  Chief Complaint  Chief Complaint  Patient presents with  . Headache    patient states this has been going on for about 2 months or so. She thought it was her new glasses but it was not.  . Fatigue    patient has been dragging for about 2 weeks now  . URI    patient has been coughing up mucus since the weekend.  . Medication Refill  . Labs Only    patient is unsure, but thinks she is suppose to have labs drawn today. Patient has only had black coffee.    HPI  Taylor Barry is a pleasant 69 year old female who is here today with concerns regarding the following symptoms sore throat, productive cough and achiness that started weeks ago.  Sputum is scant and clear. Associated with headache, fatigue, malaise, light headed with foggy concentration and anorexia. Not associated with shortness of breath, chest pain, rash, nausea vomiting, changes in bowel movements, recent travel or sick contacts. Has tried some OTC cold therapies with minimal relief. She is concerned about her weight and is interested in going to a [employer].    Active Ambulatory Problems    Diagnosis Date Noted  . Adrenal mass 07/28/2015  . Allergic rhinitis with postnasal drip 07/28/2015  . Anxiety and depression 07/28/2015  . Osteoarthritis of multiple joints 07/28/2015  . Alopecia 07/28/2015  . Hyperlipidemia LDL goal <100 07/28/2015  . Hypertension goal BP (blood pressure) < 150/90 07/28/2015  . Obstructive sleep apnea of adult 07/28/2015  . Borderline diabetes 07/28/2015  . Avitaminosis D 07/28/2015  . Adenomatous colon polyp 07/28/2015  . Arthritis of knee, degenerative 07/24/2014  . H/O total knee replacement 10/08/2014  . Other fatigue 07/28/2015  . Viral syndrome 07/28/2015   Resolved Ambulatory Problems    Diagnosis Date Noted  . No Resolved Ambulatory Problems   Past Medical History   Diagnosis Date  . Right adrenal mass   . HTN, goal below 150/90   . Hair loss   . Vitamin D deficiency   . Obstructive sleep apnea, adult   . Bronchitis, acute, with bronchospasm   . Arthritis, multiple joint involvement      History  Substance Use Topics  . Smoking status: Former Research scientist (life sciences)  . Smokeless tobacco: Not on file  . Alcohol Use: No     Current outpatient prescriptions:  .  aspirin 81 MG chewable tablet, Chew 1 tablet by mouth daily., Disp: , Rfl:  .  citalopram (CELEXA) 20 MG tablet, Take 2 tablets by mouth daily., Disp: , Rfl:  .  diazepam (VALIUM) 10 MG tablet, Take 1 tablet by mouth 2 (two) times daily as needed., Disp: , Rfl:  .  fluticasone (FLONASE) 50 MCG/ACT nasal spray, Place 2 sprays into the nose daily., Disp: , Rfl:  .  loratadine (CLARITIN) 10 MG tablet, Take 1 tablet by mouth daily., Disp: , Rfl:  .  losartan-hydrochlorothiazide (HYZAAR) 100-12.5 MG per tablet, Take 1 tablet by mouth daily., Disp: , Rfl:  .  simvastatin (ZOCOR) 10 MG tablet, Take 1 tablet by mouth daily., Disp: , Rfl:   Allergies  Allergen Reactions  . Codeine Nausea Only  . Oxycodone-Acetaminophen     Hallucination  . Prednisolone     Hallucination    ROS  CONSTITUTIONAL: No significant weight changes, fever,  chills. Yes weakness and fatigue.  HEENT:  - Eyes: No visual changes.  - Ears: No auditory changes. No pain.  - Nose: No sneezing, congestion, runny nose. - Throat: No sore throat. No changes in swallowing. SKIN: No rash or itching.  CARDIOVASCULAR: No chest pain, chest pressure or chest discomfort. No palpitations or edema.  RESPIRATORY: No shortness of breath. Yes cough and sputum.  GASTROINTESTINAL: Yes anorexia. No nausea, vomiting. No changes in bowel habits. No abdominal pain or blood.  GENITOURINARY: No dysuria. No frequency. No discharge. NEUROLOGICAL: Yes headache. No dizziness, syncope, paralysis, ataxia, numbness or tingling in the extremities. Yes memory  changes. No change in bowel or bladder control.  MUSCULOSKELETAL: No joint pain. No muscle pain. HEMATOLOGIC: No anemia, bleeding or bruising.  LYMPHATICS: No enlarged lymph nodes.  PSYCHIATRIC: Yes change in mood. No change in sleep pattern.  ENDOCRINOLOGIC: No reports of sweating, cold or heat intolerance. No polyuria or polydipsia.     Objective  Filed Vitals:   07/28/15 1014  BP: 116/70  Pulse: 92  Temp: 98.1 F (36.7 C)  TempSrc: Oral  Resp: 16  Height: 5\' 6"  (1.676 m)  Weight: 248 lb 3.2 oz (112.583 kg)  SpO2: 96%   Body mass index is 40.08 kg/(m^2).   Physical Exam  Constitutional: Patient is obese and well-nourished. In no acute distress but does appear to be fatigued from acute illness. HEENT:  - Head: Normocephalic and atraumatic.  - Ears: RIGHT TM status post surgery in 1970s grey and flat, LEFT TM bulging with minimal clear exudate.  - Nose: Nasal mucosa boggy and congested.  - Mouth/Throat: Oropharynx is moist with slight erythema of bilateral tonsils without hypertrophy or exudates. Post nasal drainage present.  - Eyes: Conjunctivae clear, EOM movements normal. PERRLA. No scleral icterus.  Neck: Normal range of motion. Neck supple. No JVD present. No thyromegaly present. No local lymphadenopathy. Cardiovascular: Regular rate, regular rhythm with no murmurs heard.  Pulmonary/Chest: Effort normal and breath sounds clear in all lung fields.  Musculoskeletal: Normal range of motion bilateral UE and LE, no joint effusions. Skin: Skin is warm and dry. No rash noted. Psychiatric: Patient has a normal mood and affect. Behavior is normal in office today. Judgment and thought content normal in office today.   Assessment & Plan 1. Viral syndrome Etiologies unclear as her symptoms are vague and clinically she is stable. Etiologies considered include allergic rhinitis with viral infection. Instructed patient on increasing hydration, nasal saline spray, steam inhalation,  NSAID if tolerated and not contraindicated. If not already doing so start taking daily anti-histamine and use a steroid nasal spray. If symptoms persist/worsen may consider antibiotic therapy but will await blood work results.   - CBC with Differential/Platelet - Comprehensive metabolic panel  2. Borderline diabetes Needs repeat testing as her vague symptoms may be due to hyperglycemia.  - Hemoglobin A1c  3. Hypertension goal BP (blood pressure) < 150/90 Well controled.  - CBC with Differential/Platelet - Comprehensive metabolic panel - TSH - Losartan-Hctz 100-12.5mg  one a day, #90, 3 refills  4. Hyperlipidemia LDL goal <100 Recheck.  - Lipid panel - Simvastatin 10mg  one at bedtime, #90, refill 3  5. Avitaminosis D Insurance would not pay for VitD blood work.  6. Other fatigue Likely due to combination of acute illness along with chronic medical issues.  - CBC with Differential/Platelet - Comprehensive metabolic panel - Hemoglobin A1c - TSH  7. Allergic rhinitis with postnasal drip Encouraged patient to restart her Zyrtec and  Flonase she has at home.   8. Anxiety and Depression  Refilled current medicatin  - Citalopram 40mg  one a day, #90, 3 refills

## 2015-07-29 ENCOUNTER — Encounter: Payer: Self-pay | Admitting: Family Medicine

## 2015-07-29 ENCOUNTER — Other Ambulatory Visit: Payer: Self-pay | Admitting: Family Medicine

## 2015-07-29 DIAGNOSIS — I129 Hypertensive chronic kidney disease with stage 1 through stage 4 chronic kidney disease, or unspecified chronic kidney disease: Secondary | ICD-10-CM | POA: Insufficient documentation

## 2015-07-29 DIAGNOSIS — E785 Hyperlipidemia, unspecified: Secondary | ICD-10-CM

## 2015-07-29 DIAGNOSIS — N182 Chronic kidney disease, stage 2 (mild): Secondary | ICD-10-CM

## 2015-07-29 LAB — LIPID PANEL
CHOL/HDL RATIO: 4.5 ratio — AB (ref 0.0–4.4)
Cholesterol, Total: 207 mg/dL — ABNORMAL HIGH (ref 100–199)
HDL: 46 mg/dL (ref >39)
LDL Calculated: 131 mg/dL — ABNORMAL HIGH (ref 0–99)
Triglycerides: 150 mg/dL — ABNORMAL HIGH (ref 0–149)
VLDL Cholesterol Cal: 30 mg/dL (ref 5–40)

## 2015-07-29 LAB — COMPREHENSIVE METABOLIC PANEL
ALK PHOS: 95 IU/L (ref 39–117)
ALT: 19 IU/L (ref 0–32)
AST: 21 IU/L (ref 0–40)
Albumin/Globulin Ratio: 2 (ref 1.1–2.5)
Albumin: 4.9 g/dL — ABNORMAL HIGH (ref 3.6–4.8)
BUN / CREAT RATIO: 15 (ref 11–26)
BUN: 15 mg/dL (ref 8–27)
Bilirubin Total: 0.4 mg/dL (ref 0.0–1.2)
CO2: 24 mmol/L (ref 18–29)
Calcium: 10.1 mg/dL (ref 8.7–10.3)
Chloride: 101 mmol/L (ref 97–108)
Creatinine, Ser: 1.03 mg/dL — ABNORMAL HIGH (ref 0.57–1.00)
GFR calc Af Amer: 65 mL/min/{1.73_m2} (ref >59)
GFR calc non Af Amer: 56 mL/min/{1.73_m2} — ABNORMAL LOW (ref >59)
GLOBULIN, TOTAL: 2.5 g/dL (ref 1.5–4.5)
GLUCOSE: 114 mg/dL — AB (ref 65–99)
Potassium: 4.9 mmol/L (ref 3.5–5.2)
Sodium: 142 mmol/L (ref 134–144)
Total Protein: 7.4 g/dL (ref 6.0–8.5)

## 2015-07-29 LAB — HEMOGLOBIN A1C
Est. average glucose Bld gHb Est-mCnc: 137 mg/dL
Hgb A1c MFr Bld: 6.4 % — ABNORMAL HIGH (ref 4.8–5.6)

## 2015-07-29 LAB — CBC WITH DIFFERENTIAL/PLATELET
BASOS ABS: 0.1 10*3/uL (ref 0.0–0.2)
Basos: 1 %
EOS (ABSOLUTE): 0.2 10*3/uL (ref 0.0–0.4)
EOS: 3 %
HEMOGLOBIN: 13.6 g/dL (ref 11.1–15.9)
Hematocrit: 40 % (ref 34.0–46.6)
IMMATURE GRANS (ABS): 0 10*3/uL (ref 0.0–0.1)
Immature Granulocytes: 0 %
LYMPHS ABS: 2.3 10*3/uL (ref 0.7–3.1)
Lymphs: 42 %
MCH: 30.6 pg (ref 26.6–33.0)
MCHC: 34 g/dL (ref 31.5–35.7)
MCV: 90 fL (ref 79–97)
Monocytes Absolute: 0.4 10*3/uL (ref 0.1–0.9)
Monocytes: 8 %
NEUTROS PCT: 46 %
Neutrophils Absolute: 2.5 10*3/uL (ref 1.4–7.0)
PLATELETS: 300 10*3/uL (ref 150–379)
RBC: 4.44 x10E6/uL (ref 3.77–5.28)
RDW: 13.8 % (ref 12.3–15.4)
WBC: 5.3 10*3/uL (ref 3.4–10.8)

## 2015-07-29 LAB — TSH: TSH: 1.37 u[IU]/mL (ref 0.450–4.500)

## 2015-07-29 MED ORDER — SIMVASTATIN 20 MG PO TABS: 20.0000 mg | ORAL_TABLET | Freq: Every day | ORAL | Status: DC

## 2015-07-29 NOTE — Progress Notes (Signed)
Simvastatin 20mg  dose sent to pharmacy.

## 2015-08-04 DIAGNOSIS — J0191 Acute recurrent sinusitis, unspecified: Secondary | ICD-10-CM | POA: Diagnosis not present

## 2015-08-04 DIAGNOSIS — G4733 Obstructive sleep apnea (adult) (pediatric): Secondary | ICD-10-CM | POA: Diagnosis not present

## 2015-08-04 DIAGNOSIS — J301 Allergic rhinitis due to pollen: Secondary | ICD-10-CM | POA: Diagnosis not present

## 2015-09-16 ENCOUNTER — Telehealth: Payer: Self-pay

## 2015-09-16 ENCOUNTER — Other Ambulatory Visit: Payer: Self-pay | Admitting: Family Medicine

## 2015-09-16 MED ORDER — DIAZEPAM 10 MG PO TABS: 10.0000 mg | ORAL_TABLET | Freq: Every day | ORAL | Status: DC | PRN

## 2015-09-16 NOTE — Telephone Encounter (Signed)
Refill request was sent to Dr. Ashany Sundaram for approval and submission.  

## 2015-09-16 NOTE — Telephone Encounter (Signed)
Tried to contact patient to inform her that Dr. Nadine Counts will not be able to excuse her from [employer] due to her not having a medical illness nor condition that physical or mentally prohibits her from serving on [employer].

## 2015-09-16 NOTE — Telephone Encounter (Signed)
Pt needs refill on Diazapam. To be sent to [facility] in [place].

## 2015-09-22 ENCOUNTER — Other Ambulatory Visit: Payer: Self-pay | Admitting: Family Medicine

## 2015-09-22 ENCOUNTER — Ambulatory Visit (INDEPENDENT_AMBULATORY_CARE_PROVIDER_SITE_OTHER): Payer: Medicare Other | Admitting: Family Medicine

## 2015-09-22 ENCOUNTER — Telehealth: Payer: Self-pay | Admitting: Family Medicine

## 2015-09-22 ENCOUNTER — Encounter: Payer: Self-pay | Admitting: Family Medicine

## 2015-09-22 VITALS — BP 114/72 | HR 79 | Temp 98.8°F | Resp 16 | Wt 247.5 lb

## 2015-09-22 DIAGNOSIS — Z0000 Encounter for general adult medical examination without abnormal findings: Secondary | ICD-10-CM | POA: Insufficient documentation

## 2015-09-22 DIAGNOSIS — F418 Other specified anxiety disorders: Secondary | ICD-10-CM

## 2015-09-22 DIAGNOSIS — Z23 Encounter for immunization: Secondary | ICD-10-CM | POA: Diagnosis not present

## 2015-09-22 DIAGNOSIS — F3341 Major depressive disorder, recurrent, in partial remission: Secondary | ICD-10-CM

## 2015-09-22 DIAGNOSIS — G4700 Insomnia, unspecified: Secondary | ICD-10-CM

## 2015-09-22 DIAGNOSIS — R5383 Other fatigue: Secondary | ICD-10-CM

## 2015-09-22 MED ORDER — BUPROPION HCL ER (XL) 150 MG PO TB24: 150.0000 mg | ORAL_TABLET | Freq: Every day | ORAL | Status: DC

## 2015-09-22 NOTE — Telephone Encounter (Signed)
Pt called and was notified about jury duty letter.

## 2015-09-22 NOTE — Progress Notes (Signed)
Name: Taylor Barry   MRN: [id number]    DOB: 1946-03-11   Date:09/22/2015       Progress Note  Subjective  Chief Complaint  Chief Complaint  Patient presents with  . Medicare Wellness    HPI  Patient is here today for a Female Medicare Wellness Visit:  The patient has been in otherwise good general health and voices concerns regarding her moods. She reports feeling low motivation, snapping at people, poor memory, poor sleep, fatigue. Onset about 1 year ago. She is taking Celexa 40 mg a day for many years. Happy family life and good support system. Denies suicidal or homicidal thoughts or crying excessively. Previous history of depression onset after issues with partner. No recent triggers. She reports significant weight gain since her left knee replacement last year as she is not motivated to maintain her diet and exercise. She is also limited by bilateral hip pain.  Initially refused flu shot but now willing to get regular dose (not high) flu shot. Has a card at home with all other immunization records that she will bring in.   Past Medical History  Diagnosis Date  . Right adrenal mass   . Hyperlipidemia LDL goal <100   . Anxiety and depression   . HTN, goal below 150/90   . Allergic rhinitis with postnasal drip   . Hair loss   . Vitamin D deficiency   . Obstructive sleep apnea, adult   . Bronchitis, acute, with bronchospasm   . Arthritis, multiple joint involvement     Past Surgical History  Procedure Laterality Date  . Tympanoplasty Right 1977  . Abdominal hysterectomy  1968    total  . Replacement total knee Bilateral     Right knee first, Left knee more recent    Family History  Problem Relation Age of Onset  . Heart disease Mother   . Alcohol abuse Father   . Diabetes Daughter     Social History   Social History  . Marital Status: Divorced    Spouse Name: N/A  . Number of Children: N/A  . Years of Education: N/A   Occupational History  . Not on file.    Social History Main Topics  . Smoking status: Former Research scientist (life sciences)  . Smokeless tobacco: Not on file  . Alcohol Use: No  . Drug Use: No  . Sexual Activity: No   Other Topics Concern  . Not on file   Social History Narrative     Current outpatient prescriptions:  .  aspirin 81 MG chewable tablet, Chew 1 tablet by mouth daily., Disp: , Rfl:  .  citalopram (CELEXA) 40 MG tablet, Take 1 tablet (40 mg total) by mouth daily., Disp: 90 tablet, Rfl: 3 .  diazepam (VALIUM) 10 MG tablet, Take 1 tablet (10 mg total) by mouth daily as needed., Disp: 90 tablet, Rfl: 1 .  fluticasone (FLONASE) 50 MCG/ACT nasal spray, Place 2 sprays into the nose daily., Disp: , Rfl:  .  losartan-hydrochlorothiazide (HYZAAR) 100-12.5 MG per tablet, Take 1 tablet by mouth daily., Disp: 90 tablet, Rfl: 3 .  simvastatin (ZOCOR) 20 MG tablet, Take 1 tablet (20 mg total) by mouth at bedtime., Disp: 90 tablet, Rfl: 3 .  buPROPion (WELLBUTRIN XL) 150 MG 24 hr tablet, Take 1 tablet (150 mg total) by mouth daily., Disp: 30 tablet, Rfl: 1  Allergies  Allergen Reactions  . Codeine Nausea Only  . Oxycodone-Acetaminophen     Hallucination  . Prednisolone  Hallucination    Fall Risk: Fall Risk  09/22/2015  Falls in the past year? No    Depression screen [facility] 2/9 09/22/2015 07/28/2015  Decreased Interest 0 0  Down, Depressed, Hopeless 1 0  PHQ - 2 Score 1 0    Functional Status Survey: Is the patient deaf or have difficulty hearing?: No Does the patient have difficulty seeing, even when wearing glasses/contacts?: No Does the patient have difficulty concentrating, remembering, or making decisions?: Yes (recently some memory issues) Does the patient have difficulty walking or climbing stairs?: No Does the patient have difficulty dressing or bathing?: No Does the patient have difficulty doing errands alone such as visiting a doctor's office or shopping?: No  ROS  CONSTITUTIONAL: No significant weight changes, fever,  chills, weakness or fatigue.  HEENT:  - Eyes: No visual changes.  - Ears: No auditory changes. No pain.  - Nose: No sneezing, congestion, runny nose. - Throat: No sore throat. No changes in swallowing. SKIN: No rash or itching.  CARDIOVASCULAR: No chest pain, chest pressure or chest discomfort. No palpitations or edema.  RESPIRATORY: No shortness of breath, cough or sputum.  GASTROINTESTINAL: No anorexia, nausea, vomiting. No changes in bowel habits. No abdominal pain or blood.  GENITOURINARY: No dysuria. No frequency. No discharge.  NEUROLOGICAL: No headache, dizziness, syncope, paralysis, ataxia, numbness or tingling in the extremities. No memory changes. No change in bowel or bladder control.  MUSCULOSKELETAL: Yes hip joint pain. No muscle pain. HEMATOLOGIC: No anemia, bleeding or bruising.  LYMPHATICS: No enlarged lymph nodes.  PSYCHIATRIC: Yes change in mood. Yes change in sleep pattern.  ENDOCRINOLOGIC: No reports of sweating, cold or heat intolerance. No polyuria or polydipsia.   Objective  Filed Vitals:   09/22/15 0830  BP: 114/72  Pulse: 79  Temp: 98.8 F (37.1 C)  TempSrc: Oral  Resp: 16  Weight: 247 lb 8 oz (112.265 kg)  SpO2: 96%   Body mass index is 39.97 kg/(m^2).  Physical Exam  Constitutional: Patient is obese and well-nourished. In no distress.  Neck: Normal range of motion. Neck supple. No JVD present. No thyromegaly present.  Cardiovascular: Normal rate, regular rhythm and normal heart sounds.  No murmur heard.  Pulmonary/Chest: Effort normal and breath sounds normal. No respiratory distress. Musculoskeletal: Normal range of motion bilateral UE and LE, no joint effusions. Bilateral knee scars well healed from replacement surgeries.  Peripheral vascular: Bilateral LE no edema. Some varicose and spider veins. Neurological: CN II-XII grossly intact with no focal deficits. Alert and oriented to person, place, and time. Coordination, balance, strength, speech  and gait are normal.  Skin: Skin is warm and dry. No rash noted. No erythema.  Psychiatric: Patient has a sad mood and affect. Behavior is normal in office today. Judgment and thought content normal in office today.   Assessment & Plan 1. Medicare annual wellness visit, subsequent Functional ability/safety issues: No Issues Hearing issues: Addressed  Activities of daily living: Discussed Home safety issues: No Issues  End Of Life Planning: Offered verbal information regarding advanced directives, healthcare power of attorney.  Preventative care, Health maintenance, Preventative health measures discussed.  Preventative screenings discussed today: lab work, colonoscopy, PAP, mammogram, DEXA.  Low Dose CT Chest recommended if Age 37-80 years, 30 pack-year currently smoking OR have quit w/in 15years.   Lifestyle risk factor issued reviewed: Diet, exercise, weight management, advised patient smoking is not healthy, nutrition/diet.  Preventative health measures discussed (5-10 year plan).  Reviewed and recommended vaccinations: - Pneumovax  -  Prevnar  - Annual Influenza - Zostavax - Tdap   Depression screening: Done Fall risk screening: Done Discuss ADLs/IADLs: Done  Current medical providers: See HPI  Other health risk factors identified this visit: No other issues Cognitive impairment issues: None identified  All above discussed with patient. Appropriate education, counseling and referral will be made based upon the above.   2. Major depressive disorder, recurrent episode, in partial remission with anxious distress Not well controlled. Discussed treatment options and decided on adding Wellbutrin to Celexa. The patient has been counseled on the proper use, side effects and potential interactions of the new medication. Patient encouraged to review the side effects and safety profile pamphlet provided with the prescription from the pharmacy as well as request counseling from the  pharmacy team as needed.   - buPROPion (WELLBUTRIN XL) 150 MG 24 hr tablet; Take 1 tablet (150 mg total) by mouth daily.  Dispense: 30 tablet; Refill: 1  3. Other fatigue Likely related to mood disorder. Encouraged patient to continue with her plans to exercise and seek nutrition counseling.  4. Insomnia Likely related to mood disorder and is using. Diazepam refill printed out last week and it was handed to her today.

## 2015-09-22 NOTE — Telephone Encounter (Signed)
Per Dr. Nadine Counts, there is nothing in her file (actual diagnosis) that prevents her from serving.

## 2015-09-22 NOTE — Telephone Encounter (Signed)
Was just seen and left the office before she remembered about jury duty. Checking status on her paperwork to release her from jury duty. She has to have this turned in as soon as possible. I checked up front but did not see anything.

## 2015-09-22 NOTE — Patient Instructions (Signed)
PLEASE PROVIDE A COPY OF YOUR PREVIOUS IMMUNIZATIONS  PNEUMONIA SHOT SHINGLES SHOT TETANUS SHOT

## 2015-11-23 ENCOUNTER — Other Ambulatory Visit: Payer: Self-pay | Admitting: Family Medicine

## 2015-12-07 ENCOUNTER — Encounter: Payer: Medicare Other | Admitting: Family Medicine

## 2016-01-19 DIAGNOSIS — I10 Essential (primary) hypertension: Secondary | ICD-10-CM | POA: Diagnosis not present

## 2016-01-19 DIAGNOSIS — E782 Mixed hyperlipidemia: Secondary | ICD-10-CM | POA: Diagnosis not present

## 2016-01-19 DIAGNOSIS — I739 Peripheral vascular disease, unspecified: Secondary | ICD-10-CM | POA: Diagnosis not present

## 2016-01-19 DIAGNOSIS — K219 Gastro-esophageal reflux disease without esophagitis: Secondary | ICD-10-CM | POA: Diagnosis not present

## 2016-01-19 DIAGNOSIS — R002 Palpitations: Secondary | ICD-10-CM | POA: Diagnosis not present

## 2016-01-19 DIAGNOSIS — R079 Chest pain, unspecified: Secondary | ICD-10-CM | POA: Diagnosis not present

## 2016-01-24 DIAGNOSIS — R079 Chest pain, unspecified: Secondary | ICD-10-CM | POA: Diagnosis not present

## 2016-01-28 DIAGNOSIS — I340 Nonrheumatic mitral (valve) insufficiency: Secondary | ICD-10-CM | POA: Diagnosis not present

## 2016-01-28 DIAGNOSIS — I739 Peripheral vascular disease, unspecified: Secondary | ICD-10-CM | POA: Diagnosis not present

## 2016-01-28 DIAGNOSIS — R079 Chest pain, unspecified: Secondary | ICD-10-CM | POA: Diagnosis not present

## 2016-02-14 DIAGNOSIS — K219 Gastro-esophageal reflux disease without esophagitis: Secondary | ICD-10-CM | POA: Diagnosis not present

## 2016-02-14 DIAGNOSIS — R079 Chest pain, unspecified: Secondary | ICD-10-CM | POA: Diagnosis not present

## 2016-02-16 ENCOUNTER — Telehealth: Payer: Self-pay | Admitting: Family Medicine

## 2016-02-16 DIAGNOSIS — F329 Major depressive disorder, single episode, unspecified: Secondary | ICD-10-CM

## 2016-02-16 DIAGNOSIS — F419 Anxiety disorder, unspecified: Principal | ICD-10-CM

## 2016-02-16 DIAGNOSIS — E785 Hyperlipidemia, unspecified: Secondary | ICD-10-CM

## 2016-02-16 DIAGNOSIS — R0982 Postnasal drip: Secondary | ICD-10-CM

## 2016-02-16 DIAGNOSIS — J309 Allergic rhinitis, unspecified: Secondary | ICD-10-CM

## 2016-02-16 DIAGNOSIS — I10 Essential (primary) hypertension: Secondary | ICD-10-CM

## 2016-02-16 MED ORDER — CITALOPRAM HYDROBROMIDE 40 MG PO TABS: 40.0000 mg | ORAL_TABLET | Freq: Every day | ORAL | Status: DC

## 2016-02-16 MED ORDER — BUPROPION HCL ER (XL) 150 MG PO TB24: 150.0000 mg | ORAL_TABLET | Freq: Every day | ORAL | Status: DC

## 2016-02-16 MED ORDER — FLUTICASONE PROPIONATE 50 MCG/ACT NA SUSP: 2.0000 | Freq: Every day | NASAL | Status: DC

## 2016-02-16 MED ORDER — SIMVASTATIN 20 MG PO TABS: 20.0000 mg | ORAL_TABLET | Freq: Every day | ORAL | Status: DC

## 2016-02-16 MED ORDER — LOSARTAN POTASSIUM-HCTZ 100-12.5 MG PO TABS: 1.0000 | ORAL_TABLET | Freq: Every day | ORAL | Status: DC

## 2016-02-16 NOTE — Telephone Encounter (Signed)
Patient is requesting refill on all her medications. Please send to [email or web address]

## 2016-02-16 NOTE — Telephone Encounter (Signed)
Refilled everything for 3 months, except Valium, she will need to f/u with new provider to discuss further refills.

## 2016-03-21 ENCOUNTER — Ambulatory Visit: Payer: Self-pay | Admitting: Family Medicine

## 2016-06-08 DIAGNOSIS — E559 Vitamin D deficiency, unspecified: Secondary | ICD-10-CM | POA: Diagnosis not present

## 2016-06-08 DIAGNOSIS — E538 Deficiency of other specified B group vitamins: Secondary | ICD-10-CM | POA: Diagnosis not present

## 2016-06-08 DIAGNOSIS — G4730 Sleep apnea, unspecified: Secondary | ICD-10-CM | POA: Diagnosis not present

## 2016-06-08 DIAGNOSIS — D509 Iron deficiency anemia, unspecified: Secondary | ICD-10-CM | POA: Diagnosis not present

## 2016-06-08 DIAGNOSIS — I10 Essential (primary) hypertension: Secondary | ICD-10-CM | POA: Diagnosis not present

## 2016-06-08 DIAGNOSIS — D519 Vitamin B12 deficiency anemia, unspecified: Secondary | ICD-10-CM | POA: Diagnosis not present

## 2016-06-08 DIAGNOSIS — F331 Major depressive disorder, recurrent, moderate: Secondary | ICD-10-CM | POA: Diagnosis not present

## 2016-06-08 DIAGNOSIS — Z0001 Encounter for general adult medical examination with abnormal findings: Secondary | ICD-10-CM | POA: Diagnosis not present

## 2016-06-08 DIAGNOSIS — K219 Gastro-esophageal reflux disease without esophagitis: Secondary | ICD-10-CM | POA: Diagnosis not present

## 2016-06-08 DIAGNOSIS — R7301 Impaired fasting glucose: Secondary | ICD-10-CM | POA: Diagnosis not present

## 2016-06-08 DIAGNOSIS — D529 Folate deficiency anemia, unspecified: Secondary | ICD-10-CM | POA: Diagnosis not present

## 2016-06-08 DIAGNOSIS — F411 Generalized anxiety disorder: Secondary | ICD-10-CM | POA: Diagnosis not present

## 2016-06-08 DIAGNOSIS — E782 Mixed hyperlipidemia: Secondary | ICD-10-CM | POA: Diagnosis not present

## 2016-07-03 DIAGNOSIS — G4710 Hypersomnia, unspecified: Secondary | ICD-10-CM | POA: Diagnosis not present

## 2016-07-03 DIAGNOSIS — K209 Esophagitis, unspecified: Secondary | ICD-10-CM | POA: Diagnosis not present

## 2016-07-03 DIAGNOSIS — G4733 Obstructive sleep apnea (adult) (pediatric): Secondary | ICD-10-CM | POA: Diagnosis not present

## 2016-07-12 DIAGNOSIS — G4733 Obstructive sleep apnea (adult) (pediatric): Secondary | ICD-10-CM | POA: Diagnosis not present

## 2016-07-19 DIAGNOSIS — R0602 Shortness of breath: Secondary | ICD-10-CM | POA: Diagnosis not present

## 2016-07-27 DIAGNOSIS — F331 Major depressive disorder, recurrent, moderate: Secondary | ICD-10-CM | POA: Diagnosis not present

## 2016-07-27 DIAGNOSIS — R0602 Shortness of breath: Secondary | ICD-10-CM | POA: Diagnosis not present

## 2016-07-27 DIAGNOSIS — G4710 Hypersomnia, unspecified: Secondary | ICD-10-CM | POA: Diagnosis not present

## 2016-07-27 DIAGNOSIS — G4733 Obstructive sleep apnea (adult) (pediatric): Secondary | ICD-10-CM | POA: Diagnosis not present

## 2016-08-29 ENCOUNTER — Other Ambulatory Visit: Payer: Self-pay | Admitting: Nurse Practitioner

## 2016-08-29 ENCOUNTER — Ambulatory Visit
Admission: RE | Admit: 2016-08-29 | Discharge: 2016-08-29 | Disposition: A | Discharge status: Home / Self Care | Payer: Medicare Other | Source: Ambulatory Visit | Attending: Nurse Practitioner | Admitting: Nurse Practitioner

## 2016-08-29 DIAGNOSIS — R52 Pain, unspecified: Secondary | ICD-10-CM

## 2016-08-29 DIAGNOSIS — M25511 Pain in right shoulder: Secondary | ICD-10-CM | POA: Insufficient documentation

## 2016-08-29 DIAGNOSIS — S4991XA Unspecified injury of right shoulder and upper arm, initial encounter: Secondary | ICD-10-CM | POA: Diagnosis not present

## 2016-08-29 DIAGNOSIS — I10 Essential (primary) hypertension: Secondary | ICD-10-CM | POA: Diagnosis not present

## 2016-08-29 DIAGNOSIS — G4733 Obstructive sleep apnea (adult) (pediatric): Secondary | ICD-10-CM | POA: Diagnosis not present

## 2016-08-29 DIAGNOSIS — F411 Generalized anxiety disorder: Secondary | ICD-10-CM | POA: Diagnosis not present

## 2016-08-29 DIAGNOSIS — B354 Tinea corporis: Secondary | ICD-10-CM | POA: Diagnosis not present

## 2016-08-29 DIAGNOSIS — Z0001 Encounter for general adult medical examination with abnormal findings: Secondary | ICD-10-CM | POA: Diagnosis not present

## 2016-08-29 DIAGNOSIS — Z124 Encounter for screening for malignant neoplasm of cervix: Secondary | ICD-10-CM | POA: Diagnosis not present

## 2016-08-29 DIAGNOSIS — K219 Gastro-esophageal reflux disease without esophagitis: Secondary | ICD-10-CM | POA: Diagnosis not present

## 2016-08-29 DIAGNOSIS — D519 Vitamin B12 deficiency anemia, unspecified: Secondary | ICD-10-CM | POA: Diagnosis not present

## 2016-08-29 DIAGNOSIS — F331 Major depressive disorder, recurrent, moderate: Secondary | ICD-10-CM | POA: Diagnosis not present

## 2017-01-16 ENCOUNTER — Other Ambulatory Visit: Payer: Self-pay | Admitting: Internal Medicine

## 2017-01-16 DIAGNOSIS — G4733 Obstructive sleep apnea (adult) (pediatric): Secondary | ICD-10-CM | POA: Diagnosis not present

## 2017-01-16 DIAGNOSIS — Z1231 Encounter for screening mammogram for malignant neoplasm of breast: Secondary | ICD-10-CM

## 2017-01-16 DIAGNOSIS — E2839 Other primary ovarian failure: Secondary | ICD-10-CM | POA: Diagnosis not present

## 2017-01-16 DIAGNOSIS — Z13820 Encounter for screening for osteoporosis: Secondary | ICD-10-CM

## 2017-01-16 DIAGNOSIS — I10 Essential (primary) hypertension: Secondary | ICD-10-CM | POA: Diagnosis not present

## 2017-01-16 DIAGNOSIS — F331 Major depressive disorder, recurrent, moderate: Secondary | ICD-10-CM | POA: Diagnosis not present

## 2017-01-23 ENCOUNTER — Other Ambulatory Visit: Payer: Self-pay

## 2017-01-23 ENCOUNTER — Ambulatory Visit: Admission: RE | Admit: 2017-01-23 | Payer: Medicare Other | Source: Ambulatory Visit

## 2017-01-25 ENCOUNTER — Other Ambulatory Visit: Payer: Self-pay

## 2017-01-25 ENCOUNTER — Ambulatory Visit
Admission: RE | Admit: 2017-01-25 | Discharge: 2017-01-25 | Disposition: A | Discharge status: Home / Self Care | Payer: Medicare Other | Source: Ambulatory Visit | Attending: Internal Medicine | Admitting: Internal Medicine

## 2017-01-25 ENCOUNTER — Ambulatory Visit: Payer: Medicare Other

## 2017-01-25 DIAGNOSIS — Z1231 Encounter for screening mammogram for malignant neoplasm of breast: Secondary | ICD-10-CM | POA: Insufficient documentation

## 2017-01-25 DIAGNOSIS — M8580 Other specified disorders of bone density and structure, unspecified site: Secondary | ICD-10-CM | POA: Insufficient documentation

## 2017-01-25 DIAGNOSIS — Z13820 Encounter for screening for osteoporosis: Secondary | ICD-10-CM

## 2017-01-25 DIAGNOSIS — M85851 Other specified disorders of bone density and structure, right thigh: Secondary | ICD-10-CM | POA: Diagnosis not present

## 2017-01-25 DIAGNOSIS — Z780 Asymptomatic menopausal state: Secondary | ICD-10-CM | POA: Insufficient documentation

## 2017-02-22 DIAGNOSIS — K219 Gastro-esophageal reflux disease without esophagitis: Secondary | ICD-10-CM | POA: Diagnosis not present

## 2017-02-22 DIAGNOSIS — R0602 Shortness of breath: Secondary | ICD-10-CM | POA: Diagnosis not present

## 2017-02-22 DIAGNOSIS — G4733 Obstructive sleep apnea (adult) (pediatric): Secondary | ICD-10-CM | POA: Diagnosis not present

## 2017-03-09 DIAGNOSIS — G8929 Other chronic pain: Secondary | ICD-10-CM | POA: Diagnosis not present

## 2017-03-09 DIAGNOSIS — M5440 Lumbago with sciatica, unspecified side: Secondary | ICD-10-CM | POA: Diagnosis not present

## 2017-03-09 DIAGNOSIS — M25562 Pain in left knee: Secondary | ICD-10-CM | POA: Diagnosis not present

## 2017-03-09 DIAGNOSIS — M25561 Pain in right knee: Secondary | ICD-10-CM | POA: Diagnosis not present

## 2017-03-15 DIAGNOSIS — F331 Major depressive disorder, recurrent, moderate: Secondary | ICD-10-CM | POA: Diagnosis not present

## 2017-03-15 DIAGNOSIS — I10 Essential (primary) hypertension: Secondary | ICD-10-CM | POA: Diagnosis not present

## 2017-03-15 DIAGNOSIS — R0602 Shortness of breath: Secondary | ICD-10-CM | POA: Diagnosis not present

## 2017-03-15 DIAGNOSIS — G4710 Hypersomnia, unspecified: Secondary | ICD-10-CM | POA: Diagnosis not present

## 2017-03-15 DIAGNOSIS — F411 Generalized anxiety disorder: Secondary | ICD-10-CM | POA: Diagnosis not present

## 2017-03-15 DIAGNOSIS — K219 Gastro-esophageal reflux disease without esophagitis: Secondary | ICD-10-CM | POA: Diagnosis not present

## 2017-04-26 DIAGNOSIS — R0982 Postnasal drip: Secondary | ICD-10-CM | POA: Diagnosis not present

## 2017-04-26 DIAGNOSIS — I10 Essential (primary) hypertension: Secondary | ICD-10-CM | POA: Diagnosis not present

## 2017-04-26 DIAGNOSIS — F331 Major depressive disorder, recurrent, moderate: Secondary | ICD-10-CM | POA: Diagnosis not present

## 2017-04-26 DIAGNOSIS — D519 Vitamin B12 deficiency anemia, unspecified: Secondary | ICD-10-CM | POA: Diagnosis not present

## 2017-08-06 DIAGNOSIS — J3089 Other allergic rhinitis: Secondary | ICD-10-CM | POA: Diagnosis not present

## 2017-08-06 DIAGNOSIS — J019 Acute sinusitis, unspecified: Secondary | ICD-10-CM | POA: Diagnosis not present

## 2017-08-17 DIAGNOSIS — I739 Peripheral vascular disease, unspecified: Secondary | ICD-10-CM | POA: Diagnosis not present

## 2017-08-17 DIAGNOSIS — R0602 Shortness of breath: Secondary | ICD-10-CM | POA: Diagnosis not present

## 2017-08-22 DIAGNOSIS — R55 Syncope and collapse: Secondary | ICD-10-CM | POA: Diagnosis not present

## 2017-08-22 DIAGNOSIS — I739 Peripheral vascular disease, unspecified: Secondary | ICD-10-CM | POA: Diagnosis not present

## 2017-08-22 DIAGNOSIS — R0602 Shortness of breath: Secondary | ICD-10-CM | POA: Diagnosis not present

## 2017-08-28 DIAGNOSIS — R42 Dizziness and giddiness: Secondary | ICD-10-CM | POA: Diagnosis not present

## 2017-08-28 DIAGNOSIS — E782 Mixed hyperlipidemia: Secondary | ICD-10-CM | POA: Diagnosis not present

## 2017-08-28 DIAGNOSIS — D510 Vitamin B12 deficiency anemia due to intrinsic factor deficiency: Secondary | ICD-10-CM | POA: Diagnosis not present

## 2017-08-28 DIAGNOSIS — R0602 Shortness of breath: Secondary | ICD-10-CM | POA: Diagnosis not present

## 2017-08-28 DIAGNOSIS — R601 Generalized edema: Secondary | ICD-10-CM | POA: Diagnosis not present

## 2017-08-28 DIAGNOSIS — I10 Essential (primary) hypertension: Secondary | ICD-10-CM | POA: Diagnosis not present

## 2017-08-28 DIAGNOSIS — E559 Vitamin D deficiency, unspecified: Secondary | ICD-10-CM | POA: Diagnosis not present

## 2017-08-28 DIAGNOSIS — E669 Obesity, unspecified: Secondary | ICD-10-CM | POA: Diagnosis not present

## 2017-08-28 DIAGNOSIS — R5383 Other fatigue: Secondary | ICD-10-CM | POA: Diagnosis not present

## 2017-08-28 DIAGNOSIS — G4733 Obstructive sleep apnea (adult) (pediatric): Secondary | ICD-10-CM | POA: Diagnosis not present

## 2017-08-28 DIAGNOSIS — E785 Hyperlipidemia, unspecified: Secondary | ICD-10-CM | POA: Diagnosis not present

## 2017-09-10 DIAGNOSIS — I10 Essential (primary) hypertension: Secondary | ICD-10-CM | POA: Diagnosis not present

## 2017-09-10 DIAGNOSIS — R42 Dizziness and giddiness: Secondary | ICD-10-CM | POA: Diagnosis not present

## 2017-09-10 DIAGNOSIS — F411 Generalized anxiety disorder: Secondary | ICD-10-CM | POA: Diagnosis not present

## 2017-09-10 DIAGNOSIS — E668 Other obesity: Secondary | ICD-10-CM | POA: Diagnosis not present

## 2017-09-10 DIAGNOSIS — G4733 Obstructive sleep apnea (adult) (pediatric): Secondary | ICD-10-CM | POA: Diagnosis not present

## 2017-09-10 DIAGNOSIS — E782 Mixed hyperlipidemia: Secondary | ICD-10-CM | POA: Diagnosis not present

## 2017-09-11 DIAGNOSIS — Z8249 Family history of ischemic heart disease and other diseases of the circulatory system: Secondary | ICD-10-CM | POA: Diagnosis not present

## 2017-09-11 DIAGNOSIS — G4733 Obstructive sleep apnea (adult) (pediatric): Secondary | ICD-10-CM | POA: Diagnosis not present

## 2017-09-11 DIAGNOSIS — R42 Dizziness and giddiness: Secondary | ICD-10-CM | POA: Diagnosis not present

## 2017-09-11 DIAGNOSIS — K219 Gastro-esophageal reflux disease without esophagitis: Secondary | ICD-10-CM | POA: Diagnosis not present

## 2017-09-11 DIAGNOSIS — R079 Chest pain, unspecified: Secondary | ICD-10-CM | POA: Diagnosis not present

## 2017-09-20 DIAGNOSIS — Z23 Encounter for immunization: Secondary | ICD-10-CM | POA: Diagnosis not present

## 2017-09-20 DIAGNOSIS — E782 Mixed hyperlipidemia: Secondary | ICD-10-CM | POA: Diagnosis not present

## 2017-09-20 DIAGNOSIS — I10 Essential (primary) hypertension: Secondary | ICD-10-CM | POA: Diagnosis not present

## 2017-09-20 DIAGNOSIS — E663 Overweight: Secondary | ICD-10-CM | POA: Diagnosis not present

## 2017-09-20 DIAGNOSIS — G4733 Obstructive sleep apnea (adult) (pediatric): Secondary | ICD-10-CM | POA: Diagnosis not present

## 2017-09-20 DIAGNOSIS — F411 Generalized anxiety disorder: Secondary | ICD-10-CM | POA: Diagnosis not present

## 2017-10-15 DIAGNOSIS — G4733 Obstructive sleep apnea (adult) (pediatric): Secondary | ICD-10-CM | POA: Diagnosis not present

## 2017-10-19 DIAGNOSIS — I10 Essential (primary) hypertension: Secondary | ICD-10-CM | POA: Diagnosis not present

## 2017-10-19 DIAGNOSIS — F411 Generalized anxiety disorder: Secondary | ICD-10-CM | POA: Diagnosis not present

## 2017-10-19 DIAGNOSIS — E782 Mixed hyperlipidemia: Secondary | ICD-10-CM | POA: Diagnosis not present

## 2017-10-19 DIAGNOSIS — R079 Chest pain, unspecified: Secondary | ICD-10-CM | POA: Diagnosis not present

## 2017-10-19 DIAGNOSIS — G4733 Obstructive sleep apnea (adult) (pediatric): Secondary | ICD-10-CM | POA: Diagnosis not present

## 2017-10-19 DIAGNOSIS — E668 Other obesity: Secondary | ICD-10-CM | POA: Diagnosis not present

## 2017-12-10 DIAGNOSIS — I10 Essential (primary) hypertension: Secondary | ICD-10-CM | POA: Diagnosis not present

## 2017-12-10 DIAGNOSIS — E668 Other obesity: Secondary | ICD-10-CM | POA: Diagnosis not present

## 2017-12-10 DIAGNOSIS — E782 Mixed hyperlipidemia: Secondary | ICD-10-CM | POA: Diagnosis not present

## 2017-12-10 DIAGNOSIS — M25571 Pain in right ankle and joints of right foot: Secondary | ICD-10-CM | POA: Diagnosis not present

## 2017-12-26 ENCOUNTER — Other Ambulatory Visit: Payer: Self-pay | Admitting: Internal Medicine

## 2017-12-26 DIAGNOSIS — Z1231 Encounter for screening mammogram for malignant neoplasm of breast: Secondary | ICD-10-CM

## 2018-01-08 DIAGNOSIS — R7302 Impaired glucose tolerance (oral): Secondary | ICD-10-CM | POA: Diagnosis not present

## 2018-01-08 DIAGNOSIS — E782 Mixed hyperlipidemia: Secondary | ICD-10-CM | POA: Diagnosis not present

## 2018-01-08 DIAGNOSIS — I10 Essential (primary) hypertension: Secondary | ICD-10-CM | POA: Diagnosis not present

## 2018-01-11 DIAGNOSIS — E782 Mixed hyperlipidemia: Secondary | ICD-10-CM | POA: Diagnosis not present

## 2018-01-11 DIAGNOSIS — M25571 Pain in right ankle and joints of right foot: Secondary | ICD-10-CM | POA: Diagnosis not present

## 2018-01-11 DIAGNOSIS — R740 Nonspecific elevation of levels of transaminase and lactic acid dehydrogenase [LDH]: Secondary | ICD-10-CM | POA: Diagnosis not present

## 2018-01-11 DIAGNOSIS — E669 Obesity, unspecified: Secondary | ICD-10-CM | POA: Diagnosis not present

## 2018-01-11 DIAGNOSIS — R7302 Impaired glucose tolerance (oral): Secondary | ICD-10-CM | POA: Diagnosis not present

## 2018-01-11 DIAGNOSIS — I10 Essential (primary) hypertension: Secondary | ICD-10-CM | POA: Diagnosis not present

## 2018-01-15 DIAGNOSIS — M79671 Pain in right foot: Secondary | ICD-10-CM | POA: Diagnosis not present

## 2018-01-15 DIAGNOSIS — M25571 Pain in right ankle and joints of right foot: Secondary | ICD-10-CM | POA: Diagnosis not present

## 2018-01-16 DIAGNOSIS — M25571 Pain in right ankle and joints of right foot: Secondary | ICD-10-CM | POA: Diagnosis not present

## 2018-01-16 DIAGNOSIS — R109 Unspecified abdominal pain: Secondary | ICD-10-CM | POA: Diagnosis not present

## 2018-01-16 DIAGNOSIS — R945 Abnormal results of liver function studies: Secondary | ICD-10-CM | POA: Diagnosis not present

## 2018-01-25 DIAGNOSIS — E782 Mixed hyperlipidemia: Secondary | ICD-10-CM | POA: Diagnosis not present

## 2018-01-25 DIAGNOSIS — I10 Essential (primary) hypertension: Secondary | ICD-10-CM | POA: Diagnosis not present

## 2018-01-25 DIAGNOSIS — F411 Generalized anxiety disorder: Secondary | ICD-10-CM | POA: Diagnosis not present

## 2018-01-25 DIAGNOSIS — R109 Unspecified abdominal pain: Secondary | ICD-10-CM | POA: Diagnosis not present

## 2018-01-25 DIAGNOSIS — E668 Other obesity: Secondary | ICD-10-CM | POA: Diagnosis not present

## 2018-01-29 ENCOUNTER — Inpatient Hospital Stay: Admission: RE | Admit: 2018-01-29 | Payer: Self-pay | Source: Ambulatory Visit

## 2018-01-29 DIAGNOSIS — K5730 Diverticulosis of large intestine without perforation or abscess without bleeding: Secondary | ICD-10-CM | POA: Insufficient documentation

## 2018-01-29 DIAGNOSIS — IMO0002 Reserved for concepts with insufficient information to code with codable children: Secondary | ICD-10-CM | POA: Insufficient documentation

## 2018-01-29 DIAGNOSIS — Z794 Long term (current) use of insulin: Secondary | ICD-10-CM | POA: Insufficient documentation

## 2018-01-29 DIAGNOSIS — E279 Disorder of adrenal gland, unspecified: Secondary | ICD-10-CM | POA: Insufficient documentation

## 2018-01-29 DIAGNOSIS — K429 Umbilical hernia without obstruction or gangrene: Secondary | ICD-10-CM | POA: Diagnosis not present

## 2018-01-29 DIAGNOSIS — E1165 Type 2 diabetes mellitus with hyperglycemia: Secondary | ICD-10-CM | POA: Insufficient documentation

## 2018-01-29 DIAGNOSIS — E1142 Type 2 diabetes mellitus with diabetic polyneuropathy: Secondary | ICD-10-CM | POA: Insufficient documentation

## 2018-01-29 DIAGNOSIS — E663 Overweight: Secondary | ICD-10-CM | POA: Diagnosis not present

## 2018-02-07 ENCOUNTER — Other Ambulatory Visit: Payer: Self-pay | Admitting: Hematology and Oncology

## 2018-02-07 ENCOUNTER — Inpatient Hospital Stay: Payer: Medicare Other

## 2018-02-07 ENCOUNTER — Ambulatory Visit
Admission: RE | Admit: 2018-02-07 | Discharge: 2018-02-07 | Disposition: A | Discharge status: Home / Self Care | Payer: Self-pay | Source: Ambulatory Visit | Attending: Hematology and Oncology | Admitting: Hematology and Oncology

## 2018-02-07 ENCOUNTER — Other Ambulatory Visit: Payer: Self-pay

## 2018-02-07 ENCOUNTER — Encounter: Payer: Self-pay | Admitting: Hematology and Oncology

## 2018-02-07 ENCOUNTER — Inpatient Hospital Stay: Payer: Medicare Other | Attending: Hematology and Oncology | Admitting: Hematology and Oncology

## 2018-02-07 VITALS — BP 134/80 | HR 71 | Temp 97.9°F | Resp 20 | Wt 257.5 lb

## 2018-02-07 DIAGNOSIS — E785 Hyperlipidemia, unspecified: Secondary | ICD-10-CM | POA: Diagnosis not present

## 2018-02-07 DIAGNOSIS — I2510 Atherosclerotic heart disease of native coronary artery without angina pectoris: Secondary | ICD-10-CM | POA: Insufficient documentation

## 2018-02-07 DIAGNOSIS — I10 Essential (primary) hypertension: Secondary | ICD-10-CM | POA: Insufficient documentation

## 2018-02-07 DIAGNOSIS — Z9989 Dependence on other enabling machines and devices: Secondary | ICD-10-CM | POA: Insufficient documentation

## 2018-02-07 DIAGNOSIS — E559 Vitamin D deficiency, unspecified: Secondary | ICD-10-CM | POA: Diagnosis not present

## 2018-02-07 DIAGNOSIS — E278 Other specified disorders of adrenal gland: Secondary | ICD-10-CM

## 2018-02-07 DIAGNOSIS — Z885 Allergy status to narcotic agent status: Secondary | ICD-10-CM | POA: Insufficient documentation

## 2018-02-07 DIAGNOSIS — Z800 Family history of malignant neoplasm of digestive organs: Secondary | ICD-10-CM | POA: Insufficient documentation

## 2018-02-07 DIAGNOSIS — F418 Other specified anxiety disorders: Secondary | ICD-10-CM | POA: Insufficient documentation

## 2018-02-07 DIAGNOSIS — R635 Abnormal weight gain: Secondary | ICD-10-CM | POA: Insufficient documentation

## 2018-02-07 DIAGNOSIS — R109 Unspecified abdominal pain: Secondary | ICD-10-CM

## 2018-02-07 DIAGNOSIS — Z87891 Personal history of nicotine dependence: Secondary | ICD-10-CM | POA: Insufficient documentation

## 2018-02-07 DIAGNOSIS — Z79899 Other long term (current) drug therapy: Secondary | ICD-10-CM | POA: Diagnosis not present

## 2018-02-07 DIAGNOSIS — Z7982 Long term (current) use of aspirin: Secondary | ICD-10-CM | POA: Diagnosis not present

## 2018-02-07 DIAGNOSIS — R1032 Left lower quadrant pain: Secondary | ICD-10-CM | POA: Diagnosis not present

## 2018-02-07 DIAGNOSIS — R197 Diarrhea, unspecified: Secondary | ICD-10-CM | POA: Diagnosis not present

## 2018-02-07 DIAGNOSIS — Z803 Family history of malignant neoplasm of breast: Secondary | ICD-10-CM | POA: Diagnosis not present

## 2018-02-07 DIAGNOSIS — G4733 Obstructive sleep apnea (adult) (pediatric): Secondary | ICD-10-CM | POA: Diagnosis not present

## 2018-02-07 DIAGNOSIS — K5790 Diverticulosis of intestine, part unspecified, without perforation or abscess without bleeding: Secondary | ICD-10-CM | POA: Diagnosis not present

## 2018-02-07 DIAGNOSIS — E279 Disorder of adrenal gland, unspecified: Secondary | ICD-10-CM | POA: Diagnosis not present

## 2018-02-07 DIAGNOSIS — K760 Fatty (change of) liver, not elsewhere classified: Secondary | ICD-10-CM | POA: Insufficient documentation

## 2018-02-07 DIAGNOSIS — K449 Diaphragmatic hernia without obstruction or gangrene: Secondary | ICD-10-CM | POA: Insufficient documentation

## 2018-02-07 DIAGNOSIS — K439 Ventral hernia without obstruction or gangrene: Secondary | ICD-10-CM | POA: Diagnosis not present

## 2018-02-07 LAB — CBC WITH DIFFERENTIAL/PLATELET
Basophils Absolute: 0 10*3/uL (ref 0–0.1)
Basophils Relative: 1 %
Eosinophils Absolute: 0.1 10*3/uL (ref 0–0.7)
Eosinophils Relative: 2 %
HCT: 39.1 % (ref 35.0–47.0)
Hemoglobin: 13.1 g/dL (ref 12.0–16.0)
Lymphocytes Relative: 41 %
Lymphs Abs: 1.7 10*3/uL (ref 1.0–3.6)
MCH: 31.4 pg (ref 26.0–34.0)
MCHC: 33.6 g/dL (ref 32.0–36.0)
MCV: 93.7 fL (ref 80.0–100.0)
Monocytes Absolute: 0.3 10*3/uL (ref 0.2–0.9)
Monocytes Relative: 7 %
Neutro Abs: 2 10*3/uL (ref 1.4–6.5)
Neutrophils Relative %: 49 %
Platelets: 307 10*3/uL (ref 150–440)
RBC: 4.18 MIL/uL (ref 3.80–5.20)
RDW: 14 % (ref 11.5–14.5)
WBC: 4.1 10*3/uL (ref 3.6–11.0)

## 2018-02-07 LAB — BASIC METABOLIC PANEL
Anion gap: 10 (ref 5–15)
BUN: 20 mg/dL (ref 6–20)
CO2: 25 mmol/L (ref 22–32)
Calcium: 9.6 mg/dL (ref 8.9–10.3)
Chloride: 102 mmol/L (ref 101–111)
Creatinine, Ser: 1.11 mg/dL — ABNORMAL HIGH (ref 0.44–1.00)
GFR calc Af Amer: 57 mL/min — ABNORMAL LOW (ref >60)
GFR calc non Af Amer: 49 mL/min — ABNORMAL LOW (ref >60)
Glucose, Bld: 121 mg/dL — ABNORMAL HIGH (ref 65–99)
Potassium: 4.3 mmol/L (ref 3.5–5.1)
Sodium: 137 mmol/L (ref 135–145)

## 2018-02-07 LAB — CORTISOL: Cortisol, Plasma: 6.2 ug/dL

## 2018-02-07 NOTE — Progress Notes (Signed)
[facility] day:  02/07/2018  Chief Complaint: Taylor Barry is a 72 y.o. female with an adrenal mass who is referred in consultation by Dr. Tami Ribas for assessment and management.  HPI:  The patient presented to PCP for evaluation of lower left quadrant abdominal pain. She has known diverticulosis. Routine labs were done.  Imaging was performed secondary to a history of diverticulitis.  Abdomen and pelvic CT on 01/25/2018 revealed a 3 cm right adrenal mass which did not appear to contain fat and thus did not meet criteria for an adenoma, although statistically likely represents an adenoma.  An MRI would be optimal.  There was diverticulosis.  There were no liver lesions or adenopathy.    There were no lytic or blastic lesions.  Labs on 01/08/2018 revealed a sodium of 141, potassium 4.9, creatinine 1.03, calcium 9.9, albumen 4.4, and glucose 128.  LFTs revealed an AST of 72, ALT 66, alkaline phosphatase of 84, and bilirubin 0.5.  Hemoglobin A1C was 6.6%. Patient was started on Metformin, which she only took for 2 days before discontinuing due to significant vertiginous symptoms.   She has been referred to endocrinology.  Her appointment is on 05/06/2018.  Patient has known OSAH syndrome. Patient uses nocturnal PAP therapy. Patient denies orthopnea and having to sleep upright with the use of her CPAP therapy.   Symptomatically, patient experiences exertional dyspnea. She states, "I can walk to the bathroom and be out of breath". Patient has continues lower LEFT quadrant abdominal pain and diarrhea. She states, "I have weird and strange feelings in my lower abdomen". Patient currently on antibiotics for diverticulitis. Patient has known hiatal and ventral hernias.   She notes hair loss x 1 year.  She comments that her sister also has thin hair.  She has well controlled hypertension x 5 years. She has gained around 100 pounds over the last 5 years. Patient  admits that she over eats and consumes a lot of sweets.   She denies fevers. She has experienced sweats for "quite sometime".  She notes a "spot on the lung" for 30-40 years.  Patient has a family history that is significant for various cancer diagnoses (breast cancer in a maternal aunt, colon cancer in a maternal nephew age 74, paternal grandfather with colon cancer, maternal grandmother with unknown cancer).  No family history of thyroid cancer, pituitary tumor, parathyroid tumor, or pancreatic cancer.   Past Medical History:  Diagnosis Date  . Allergic rhinitis with postnasal drip   . Anxiety and depression   . Arthritis, multiple joint involvement   . Bronchitis, acute, with bronchospasm   . Hair loss   . HTN, goal below 150/90   . Hyperlipidemia LDL goal <100   . Obstructive sleep apnea, adult   . Right adrenal mass ([place])   . Vitamin D deficiency     Past Surgical History:  Procedure Laterality Date  . ABDOMINAL HYSTERECTOMY  1968   total  . REPLACEMENT TOTAL KNEE Bilateral    Right knee first, Left knee more recent  . TYMPANOPLASTY Right 1977    Family History  Problem Relation Age of Onset  . Heart disease Mother   . Alcohol abuse Father   . Diabetes Daughter   . Breast cancer Maternal Aunt   . Cancer Brother   . Cancer Maternal Grandmother   . Cancer Maternal Grandfather     Social History:  reports that she quit smoking about 23 years ago.  She smoked 0.25 packs per day. She does not have any smokeless tobacco history on file. She reports that she does not drink alcohol or use drugs.  Patient is a former 1/4-1/2 pack per day smoker. She stopped smoking in 1996.  Patient lives alone in [place]. 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 Patient denies known exposures to radiation on toxins. The patient is accompanied by her daughters, Ivin Booty and Jeannene Patella, today.  Allergies:  Allergies  Allergen Reactions  . Codeine Nausea Only  . Oxycodone-Acetaminophen      Hallucination  . Prednisolone     Hallucination    Current Medications: Current Outpatient Medications  Medication Sig Dispense Refill  . aspirin 81 MG chewable tablet Chew 1 tablet by mouth daily.    Marland Kitchen buPROPion (WELLBUTRIN XL) 150 MG 24 hr tablet Take 1 tablet (150 mg total) by mouth daily. 90 tablet 0  . Cholecalciferol (VITAMIN D-3) 5000 units TABS Take by mouth.    . ciprofloxacin (CIPRO) 500 MG tablet Take 500 mg by mouth daily.  0  . clonazePAM (KLONOPIN) 1 MG tablet Take 1 mg by mouth at bedtime.  0  . losartan-hydrochlorothiazide (HYZAAR) 100-12.5 MG tablet Take 1 tablet by mouth daily. 90 tablet 0  . metroNIDAZOLE (FLAGYL) 500 MG tablet Take 500 mg by mouth daily.  0  . Multiple Vitamins-Minerals (MULTIVITAMIN WITH MINERALS) tablet Take 1 tablet by mouth daily.    . pantoprazole (PROTONIX) 40 MG tablet Take 40 mg by mouth daily.    . Pitavastatin Calcium (LIVALO) 2 MG TABS Take 2 mg by mouth daily.    . Vitamin D, Ergocalciferol, (DRISDOL) 50000 units CAPS capsule Take 50,000 Units by mouth every 7 (seven) days.     No current facility-administered medications for this visit.     Review of Systems:  GENERAL:  Feels "ok".  No fevers or sweats.  Weight gain of 100 pounds in past 5 years. PERFORMANCE STATUS (ECOG):  1 HEENT:  Recent new eyeglass prescription.  No visual changes, runny nose, sore throat, mouth sores or tenderness. Lungs: Shortness of breath on exertion.  No cough.  No hemoptysis. Cardiac:  No chest pain, palpitations, orthopnea, or PND. GI:  Discomfort in LLQ.   Spells of diarrhea.  Hiatal hernia.  No nausea, vomiting, constipation, melena or hematochezia. GU:  No urgency, frequency, dysuria, or hematuria. Musculoskeletal:  No back pain.  No joint pain.  No muscle tenderness. Extremities:  Right leg "trouble" with pain and swelling. Skin:  No rashes or skin changes. Neuro:  Right side of head tender s/p surgery.  No headache, numbness or weakness, balance or  coordination issues. Endocrine:  No diabetes, thyroid issues.  Mild occasional hot flashes and night sweats. Psych:  No mood changes, depression or anxiety. Insomnia, long standing. Pain:  No focal pain. Review of systems:  All other systems reviewed and found to be negative.  Physical Exam: Blood pressure 134/80, pulse 71, temperature 97.9 F (36.6 C), temperature source Tympanic, resp. rate 20, weight 257 lb 8 oz (116.8 kg), SpO2 94 %. GENERAL:  Well developed, well nourished, woman sitting comfortably in the exam room in no acute distress. MENTAL STATUS:  Alert and oriented to person, place and time. HEAD:  Grey hair.  Normocephalic, atraumatic, face symmetric, no Cushingoid features. EYES:  Glasses.  Green eyes.  Pupils equal round and reactive to light and accomodation.  No conjunctivitis or scleral icterus. ENT:  Oropharynx clear without lesion.  Tongue normal. Mucous membranes moist.  RESPIRATORY:  Clear to auscultation without rales, wheezes or rhonchi. CARDIOVASCULAR:  Regular rate and rhythm without murmur, rub or gallop. ABDOMEN:  Diffuse mild tenderness, most pronounced in the LLQ and epigastric region. Soft with active bowel sounds, and no appreciable hepatosplenomegaly.  No masses. BACK:  Prominent SQ tissue upper back. SKIN:  No rashes, ulcers or lesions. EXTREMITIES: Venous venous varicosities to BLE; R>L. Diffuse tenderness. No edema, no skin discoloration. No palpable cords. LYMPH NODES: No palpable cervical, supraclavicular, axillary or inguinal adenopathy  NEUROLOGICAL: Unremarkable. PSYCH:  Appropriate.   No visits with results within 3 Day(s) from this visit.  Latest known visit with results is:  Office Visit on 07/28/2015  Component Date Value Ref Range Status  . WBC 07/28/2015 5.3  3.4 - 10.8 x10E3/uL Final  . RBC 07/28/2015 4.44  3.77 - 5.28 x10E6/uL Final  . Hemoglobin 07/28/2015 13.6  11.1 - 15.9 g/dL Final  . Hematocrit 07/28/2015 40.0  34.0 - 46.6 % Final   . MCV 07/28/2015 90  79 - 97 fL Final  . MCH 07/28/2015 30.6  26.6 - 33.0 pg Final  . MCHC 07/28/2015 34.0  31.5 - 35.7 g/dL Final  . RDW 07/28/2015 13.8  12.3 - 15.4 % Final  . Platelets 07/28/2015 300  150 - 379 x10E3/uL Final  . Neutrophils 07/28/2015 46  % Final  . Lymphs 07/28/2015 42  % Final  . Monocytes 07/28/2015 8  % Final  . Eos 07/28/2015 3  % Final  . Basos 07/28/2015 1  % Final  . Neutrophils Absolute 07/28/2015 2.5  1.4 - 7.0 x10E3/uL Final  . Lymphocytes Absolute 07/28/2015 2.3  0.7 - 3.1 x10E3/uL Final  . Monocytes Absolute 07/28/2015 0.4  0.1 - 0.9 x10E3/uL Final  . EOS (ABSOLUTE) 07/28/2015 0.2  0.0 - 0.4 x10E3/uL Final  . Basophils Absolute 07/28/2015 0.1  0.0 - 0.2 x10E3/uL Final  . Immature Granulocytes 07/28/2015 0  % Final  . Immature Grans (Abs) 07/28/2015 0.0  0.0 - 0.1 x10E3/uL Final  . Glucose 07/28/2015 114* 65 - 99 mg/dL Final  . BUN 07/28/2015 15  8 - 27 mg/dL Final  . Creatinine, Ser 07/28/2015 1.03* 0.57 - 1.00 mg/dL Final  . GFR calc non Af Amer 07/28/2015 56* >59 mL/min/1.73 Final  . GFR calc Af Amer 07/28/2015 65  >59 mL/min/1.73 Final  . BUN/Creatinine Ratio 07/28/2015 15  11 - 26 Final  . Sodium 07/28/2015 142  134 - 144 mmol/L Final  . Potassium 07/28/2015 4.9  3.5 - 5.2 mmol/L Final  . Chloride 07/28/2015 101  97 - 108 mmol/L Final  . CO2 07/28/2015 24  18 - 29 mmol/L Final  . Calcium 07/28/2015 10.1  8.7 - 10.3 mg/dL Final  . Total Protein 07/28/2015 7.4  6.0 - 8.5 g/dL Final  . Albumin 07/28/2015 4.9* 3.6 - 4.8 g/dL Final  . Globulin, Total 07/28/2015 2.5  1.5 - 4.5 g/dL Final  . Albumin/Globulin Ratio 07/28/2015 2.0  1.1 - 2.5 Final  . Bilirubin Total 07/28/2015 0.4  0.0 - 1.2 mg/dL Final  . Alkaline Phosphatase 07/28/2015 95  39 - 117 IU/L Final  . AST 07/28/2015 21  0 - 40 IU/L Final  . ALT 07/28/2015 19  0 - 32 IU/L Final  . Hgb A1c MFr Bld 07/28/2015 6.4* 4.8 - 5.6 % Final   Comment:          Pre-diabetes: 5.7 - 6.4           Diabetes: >6.4  Glycemic control for adults with diabetes: <7.0   . Est. average glucose Bld gHb Est-m* 07/28/2015 137  mg/dL Final  . Cholesterol, Total 07/28/2015 207* 100 - 199 mg/dL Final  . Triglycerides 07/28/2015 150* 0 - 149 mg/dL Final  . HDL 07/28/2015 46  >39 mg/dL Final   Comment: According to ATP-III Guidelines, HDL-C >59 mg/dL is considered a negative risk factor for CHD.   Marland Kitchen VLDL Cholesterol Cal 07/28/2015 30  5 - 40 mg/dL Final  . LDL Calculated 07/28/2015 131* 0 - 99 mg/dL Final  . Chol/HDL Ratio 07/28/2015 4.5* 0.0 - 4.4 ratio units Final   Comment:                                   T. Chol/HDL Ratio                                             Men  Women                               1/2 Avg.Risk  3.4    3.3                                   Avg.Risk  5.0    4.4                                2X Avg.Risk  9.6    7.1                                3X Avg.Risk 23.4   11.0   . TSH 07/28/2015 1.370  0.450 - 4.500 uIU/mL Final    Assessment:  Taylor Barry is a 72 y.o. female with a right adrenal mass noted incidentally on an abdominal and pelvic CT scan performed for LLQ pain.  Abdomen and pelvic CT at [facility] on 01/25/2018 revealed a 3 cm right adrenal mass which did not appear to contain fat and thus did not meet criteria for an adenoma, although statistically likely represents an adenoma.  An MRI would be optimal.  There was diverticulosis.  There were no liver lesions or adenopathy.    There were no lytic or blastic lesions.  She has a < 10 pack year smoking history.  She describes a history of "spot on the lung" years ago.  Symptomatically, she has gained 100 pounds in the past 5 years.  She has well controlled hypertension for 5 years.  There is no family history of MEN.  Plan: 1.  Discuss adrenal incidentaloma. Review likely benign adenomatous findings versus early malignant lesion.  2.  Labs today: CBC, CMP, aldosterone, renin, 24  hour urine for metanephrines, estradiol, ACTH, metanephrine (plasma). 3.  Schedule non-contrast chest CT. 4.  Discuss imaging at tumor board on 02/07/2018.  Review outside images with radiology.  5.  RTC in 1 week for MD assessment and review of workup.   Addendum:  Old imaging available for comparison.  Abdominal ultrasound on 01/16/2017 revealed hepatomegaly with evidence of fatty  liver disease.  There was a small left hepatic lobe cyst with internal debris.  There was no evidence of cholelithiasis or evidence of biliary tract obstruction.  [facility] on [facility]08/2015 revealed no pulmonary embolus.  There was scattered atelectasis in both lungs. Punctate granuloma in the right upper lobe. No acute intrathoracic process. There was a normal variant aberrant right subclavian artery arising from the aortic arch. There was a 3.2 cm right adrenal nodule.    Honor Loh, NP  02/07/2018, 9:13 AM   I saw and evaluated the patient, participating in the key portions of the service and reviewing pertinent diagnostic studies and records.  I reviewed the nurse practitioner's note and agree with the findings and the plan.  The assessment and plan were discussed with the patient.  Additional diagnostic study of a chest CT are needed to clarify her history of "spot on the lung" given her smoking history and adrenal lesion and would change the clinical management.  Multiple questions were asked by the patient and answered.   Lequita Asal, MD  02/07/2018, 9:13 AM

## 2018-02-07 NOTE — Progress Notes (Signed)
Patient here today as new evaluation regarding adrenal mass and liver involvement.  Referred by Dr. Tami Ribas.  Patient of Dr. Humphrey Rolls.

## 2018-02-08 LAB — ACTH: C206 ACTH: 8.7 pg/mL (ref 7.2–63.3)

## 2018-02-08 LAB — ESTRADIOL: Estradiol: 13.2 pg/mL

## 2018-02-10 LAB — METANEPHRINES, PLASMA
Metanephrine, Free: <10 pg/mL (ref 0–62)
Normetanephrine, Free: 50 pg/mL (ref 0–145)

## 2018-02-11 ENCOUNTER — Ambulatory Visit
Admission: RE | Admit: 2018-02-11 | Discharge: 2018-02-11 | Disposition: A | Discharge status: Home / Self Care | Payer: Medicare Other | Source: Ambulatory Visit | Attending: Urgent Care | Admitting: Urgent Care

## 2018-02-11 DIAGNOSIS — R918 Other nonspecific abnormal finding of lung field: Secondary | ICD-10-CM | POA: Diagnosis not present

## 2018-02-11 DIAGNOSIS — E279 Disorder of adrenal gland, unspecified: Secondary | ICD-10-CM | POA: Diagnosis present

## 2018-02-11 DIAGNOSIS — D3501 Benign neoplasm of right adrenal gland: Secondary | ICD-10-CM | POA: Diagnosis not present

## 2018-02-11 DIAGNOSIS — E278 Other specified disorders of adrenal gland: Secondary | ICD-10-CM

## 2018-02-11 DIAGNOSIS — I2510 Atherosclerotic heart disease of native coronary artery without angina pectoris: Secondary | ICD-10-CM | POA: Diagnosis not present

## 2018-02-11 DIAGNOSIS — I700 Atherosclerosis of aorta: Secondary | ICD-10-CM | POA: Insufficient documentation

## 2018-02-11 DIAGNOSIS — R911 Solitary pulmonary nodule: Secondary | ICD-10-CM | POA: Diagnosis not present

## 2018-02-11 LAB — ALDOSTERONE + RENIN ACTIVITY W/ RATIO
ALDO / PRA Ratio: 0.6 (ref 0.0–30.0)
Aldosterone: 5.1 ng/dL (ref 0.0–30.0)
PRA LC/MS/MS: 8.939 ng/mL/hr — ABNORMAL HIGH (ref 0.167–5.380)

## 2018-02-14 ENCOUNTER — Other Ambulatory Visit: Payer: Self-pay | Admitting: *Deleted

## 2018-02-14 DIAGNOSIS — R1032 Left lower quadrant pain: Secondary | ICD-10-CM | POA: Diagnosis not present

## 2018-02-14 DIAGNOSIS — R197 Diarrhea, unspecified: Secondary | ICD-10-CM | POA: Diagnosis not present

## 2018-02-14 DIAGNOSIS — K439 Ventral hernia without obstruction or gangrene: Secondary | ICD-10-CM | POA: Diagnosis not present

## 2018-02-14 DIAGNOSIS — E278 Other specified disorders of adrenal gland: Secondary | ICD-10-CM

## 2018-02-14 DIAGNOSIS — K5790 Diverticulosis of intestine, part unspecified, without perforation or abscess without bleeding: Secondary | ICD-10-CM | POA: Diagnosis not present

## 2018-02-14 DIAGNOSIS — R635 Abnormal weight gain: Secondary | ICD-10-CM | POA: Diagnosis not present

## 2018-02-14 DIAGNOSIS — E279 Disorder of adrenal gland, unspecified: Secondary | ICD-10-CM | POA: Diagnosis not present

## 2018-02-15 ENCOUNTER — Inpatient Hospital Stay (HOSPITAL_BASED_OUTPATIENT_CLINIC_OR_DEPARTMENT_OTHER): Payer: Medicare Other | Admitting: Hematology and Oncology

## 2018-02-15 ENCOUNTER — Other Ambulatory Visit: Payer: Self-pay

## 2018-02-15 VITALS — BP 138/70 | HR 80 | Temp 97.8°F | Resp 18 | Wt 259.7 lb

## 2018-02-15 DIAGNOSIS — R1032 Left lower quadrant pain: Secondary | ICD-10-CM | POA: Diagnosis not present

## 2018-02-15 DIAGNOSIS — K760 Fatty (change of) liver, not elsewhere classified: Secondary | ICD-10-CM

## 2018-02-15 DIAGNOSIS — R635 Abnormal weight gain: Secondary | ICD-10-CM | POA: Diagnosis not present

## 2018-02-15 DIAGNOSIS — F418 Other specified anxiety disorders: Secondary | ICD-10-CM

## 2018-02-15 DIAGNOSIS — Z87891 Personal history of nicotine dependence: Secondary | ICD-10-CM

## 2018-02-15 DIAGNOSIS — Z885 Allergy status to narcotic agent status: Secondary | ICD-10-CM

## 2018-02-15 DIAGNOSIS — Z79899 Other long term (current) drug therapy: Secondary | ICD-10-CM

## 2018-02-15 DIAGNOSIS — I10 Essential (primary) hypertension: Secondary | ICD-10-CM

## 2018-02-15 DIAGNOSIS — K439 Ventral hernia without obstruction or gangrene: Secondary | ICD-10-CM | POA: Diagnosis not present

## 2018-02-15 DIAGNOSIS — Z803 Family history of malignant neoplasm of breast: Secondary | ICD-10-CM

## 2018-02-15 DIAGNOSIS — E278 Other specified disorders of adrenal gland: Secondary | ICD-10-CM

## 2018-02-15 DIAGNOSIS — I2510 Atherosclerotic heart disease of native coronary artery without angina pectoris: Secondary | ICD-10-CM

## 2018-02-15 DIAGNOSIS — Z9989 Dependence on other enabling machines and devices: Secondary | ICD-10-CM

## 2018-02-15 DIAGNOSIS — E785 Hyperlipidemia, unspecified: Secondary | ICD-10-CM | POA: Diagnosis not present

## 2018-02-15 DIAGNOSIS — K449 Diaphragmatic hernia without obstruction or gangrene: Secondary | ICD-10-CM | POA: Diagnosis not present

## 2018-02-15 DIAGNOSIS — E279 Disorder of adrenal gland, unspecified: Secondary | ICD-10-CM

## 2018-02-15 DIAGNOSIS — E559 Vitamin D deficiency, unspecified: Secondary | ICD-10-CM

## 2018-02-15 DIAGNOSIS — K5790 Diverticulosis of intestine, part unspecified, without perforation or abscess without bleeding: Secondary | ICD-10-CM | POA: Diagnosis not present

## 2018-02-15 DIAGNOSIS — G4733 Obstructive sleep apnea (adult) (pediatric): Secondary | ICD-10-CM

## 2018-02-15 DIAGNOSIS — Z7982 Long term (current) use of aspirin: Secondary | ICD-10-CM

## 2018-02-15 DIAGNOSIS — R197 Diarrhea, unspecified: Secondary | ICD-10-CM | POA: Diagnosis not present

## 2018-02-15 DIAGNOSIS — Z800 Family history of malignant neoplasm of digestive organs: Secondary | ICD-10-CM

## 2018-02-15 NOTE — Progress Notes (Signed)
Here for follow up. Over stated " doing well "

## 2018-02-15 NOTE — Progress Notes (Signed)
[facility] day:  Barry  Chief Complaint: Taylor Barry is a 72 y.o. female with an adrenal mass who is seen for review of work-up and discussion regarding direction of therapy.  HPI:  The patient was last seen in the medical oncology clinic on 02/07/2018 for initial consultation.  Abdomen and pelvic CT at [facility] on 01/25/2018 revealed a 3 cm right adrenal mass which did not appear to contain fat and thus did not meet criteria for an adenoma.  She had a < 10 pack year smoking history.  She had gained 100 pounds in 5 years.  She had no family history of MEN.  Blood pressure was well controlled.  She was scheduled for chest CT and laboratory work-up.  At the end of the visit, it was discovered that she had a chest CT angiogram at [facility] on 01/08/2015. There was a 3.2 cm right adrenal nodule.   Chest CT on 02/11/2018 revealed pulmonary nodules stable since 2016, considered benign. There was no  acute cardiopulmonary disease.  There was a stable right adrenal adenoma.  There was three-vessel coronary atherosclerosis.  There was aberrant ectatic right subclavian artery.  Normal labs on 02/07/2018 included: CBC with diff, estradiol (13.2), ACTH (8.7), cortisol (6.2), aldosterone+ renin activity (per nephrology), and plasma metanephrines.  BMP revealed a creatinine of 1.11.  24 hour urine metanephrines were normal.  Symptomatically, she denies any new complaints.   Past Medical History:  Diagnosis Date  . Allergic rhinitis with postnasal drip   . Anxiety and depression   . Arthritis, multiple joint involvement   . Bronchitis, acute, with bronchospasm   . Hair loss   . HTN, goal below 150/90   . Hyperlipidemia LDL goal <100   . Obstructive sleep apnea, adult   . Right adrenal mass ([place])   . Vitamin D deficiency     Past Surgical History:  Procedure Laterality Date  . ABDOMINAL HYSTERECTOMY  1968   total  . REPLACEMENT  TOTAL KNEE Bilateral    Right knee first, Left knee more recent  . TYMPANOPLASTY Right 1977    Family History  Problem Relation Age of Onset  . Heart disease Mother   . Alcohol abuse Father   . Diabetes Daughter   . Breast cancer Maternal Aunt   . Cancer Brother   . Cancer Maternal Grandmother   . Cancer Maternal Grandfather     Social History:  reports that she quit smoking about 23 years ago. She smoked 0.25 packs per day. she has never used smokeless tobacco. She reports that she does not drink alcohol or use drugs.  Patient is a former 1/4-1/2 pack per day smoker. She stopped smoking in 1996.  Patient lives alone in [place]. 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 Patient denies known exposures to radiation on toxins. The patient is accompanied by her daughters, Taylor Barry and Taylor Barry, today.  Allergies:  Allergies  Allergen Reactions  . Codeine Nausea Only  . Oxycodone-Acetaminophen     Hallucination  . Prednisolone     Hallucination    Current Medications: Current Outpatient Medications  Medication Sig Dispense Refill  . aspirin 81 MG chewable tablet Chew 1 tablet by mouth daily.    . Cholecalciferol (VITAMIN D-3) 5000 units TABS Take by mouth.    . clonazePAM (KLONOPIN) 1 MG tablet Take 1 mg by mouth at bedtime.  0  . losartan-hydrochlorothiazide (HYZAAR) 100-12.5 MG tablet Take 1 tablet by  mouth daily. 90 tablet 0  . Multiple Vitamins-Minerals (MULTIVITAMIN WITH MINERALS) tablet Take 1 tablet by mouth daily.    . pantoprazole (PROTONIX) 40 MG tablet Take 40 mg by mouth daily.    . Pitavastatin Calcium (LIVALO) 2 MG TABS Take 2 mg by mouth daily.    . Vitamin D, Ergocalciferol, (DRISDOL) 50000 units CAPS capsule Take 50,000 Units by mouth every 7 (seven) days.     No current facility-administered medications for this visit.     Review of Systems:  GENERAL:  Feels "ok".  No fevers or sweats.  Weight gain of 100 pounds in past 5 years. PERFORMANCE STATUS (ECOG):   1 HEENT:  Recent new eyeglass prescription.  No visual changes, runny nose, sore throat, mouth sores or tenderness. Lungs: Shortness of breath on exertion.  No cough.  No hemoptysis. Cardiac:  No chest pain, palpitations, orthopnea, or PND. GI:  Discomfort in LLQ.   Spells of diarrhea.  Hiatal hernia.  No nausea, vomiting, constipation, melena or hematochezia. GU:  No urgency, frequency, dysuria, or hematuria. Musculoskeletal:  No back pain.  No joint pain.  No muscle tenderness. Extremities:  Right leg "trouble" with pain and swelling. Skin:  No rashes or skin changes. Neuro:  Right side of head tender s/p surgery.  No headache, numbness or weakness, balance or coordination issues. Endocrine:  No diabetes, thyroid issues.  Mild occasional hot flashes and night sweats. Psych:  No mood changes, depression or anxiety. Insomnia, long standing. Pain:  No focal pain. Review of systems:  All other systems reviewed and found to be negative.  Physical Exam: Blood pressure 138/70, pulse 80, temperature 97.8 F (36.6 C), temperature source Tympanic, resp. rate 18, weight 259 lb 11.2 oz (117.8 kg). GENERAL:  Well developed, well nourished, woman sitting comfortably in the exam room in no acute distress. MENTAL STATUS:  Alert and oriented to person, place and time. HEAD:  Pearline Cables hair.  Normocephalic, atraumatic, face symmetric, no Cushingoid features. EYES:  Glasses.  Green eyes. No conjunctivitis or scleral icterus. NEUROLOGICAL: Unremarkable. PSYCH:  Appropriate.   Orders Only on 02/14/2018  Component Date Value Ref Range Status  . Normetanephrine, Ur 02/14/2018 162  Undefined ug/L Final   Comment: (NOTE) This test was developed and its performance characteristics determined by [facility]. It has not been cleared or approved by the Food and Drug Administration.   . Normetanephrine, 24H Ur 02/14/2018 324  82 - 500 ug/24 hr Final   Comment:      (Hypertensive) >17 years 11 months:    110   1050    . Metaneph Total, Ur 02/14/2018 26  Undefined ug/L Final   Comment: (NOTE) This test was developed and its performance characteristics determined by [facility]. It has not been cleared or approved by the Food and Drug Administration.   . Metanephrines, 24H Ur 02/14/2018 52  45 - 290 ug/24 hr Final   Comment: (NOTE)     (Hypertensive) >17 years 11 months:     55 -  32 Performed At: [facility] [place], [place] [id number] Rush Farmer MD [id number]   . Total Volume 02/14/2018 2,000   Final   Performed at [facility], [address] [place], [place] [address]    Assessment:  LANASIA PORRAS is a 72 y.o. female with a right adrenal mass noted incidentally on an abdominal and pelvic CT scan performed for LLQ pain.  Abdomen and pelvic CT at [facility] on 01/25/2018  revealed a 3 cm right adrenal mass which did not appear to contain fat and thus did not meet criteria for an adenoma, although statistically likely represents an adenoma.  An MRI would be optimal.  There was diverticulosis.  There were no liver lesions or adenopathy.    There were no lytic or blastic lesions.  Chest CT angiogram at [facility] on 01/08/2015 revealed no pulmonary embolus.  There was scattered atelectasis in both lungs. Punctate granuloma in the right upper lobe. No acute intrathoracic process. There was a normal variant aberrant right subclavian artery arising from the aortic arch. There was a 3.2 cm right adrenal nodule.  Work-up on 02/07/2018 included the following normal labs: CBC with diff, BMP (Cr 1.11), estradiol, ACTH, cortisol, aldosterone+ renin activity, plasma metanephrines, and 24 hour urine metanephrines.   She has a < 10 pack year smoking history.  She describes a history of "spot on the lung" years ago.  Chest CT on 02/11/2018 revealed pulmonary nodules stable since 2016, considered benign. There was a stable right adrenal adenoma.  There was three-vessel  coronary atherosclerosis.  There was aberrant ectatic right subclavian artery.  Symptomatically, she has gained 100 pounds in the past 5 years.  She has well controlled hypertension for 5 years.  There is no family history of MEN.  Plan: 1.  Discuss work-up.  Labs and imaging suggest benign adenoma stable since 2016.   2.  Discuss tumor board discussion (before chest CT and work-up).  Suspect benign adenoma.  Could perform MRI for confirmation.  Will review with Dr. Kathlene Cote. 3.  RTC prn.   Lequita Asal, MD  Barry, 4:35 PM

## 2018-02-18 LAB — METANEPHRINES, URINE, 24 HOUR
Metaneph Total, Ur: 26 ug/L
Metanephrines, 24H Ur: 52 ug/24 hr (ref 45–290)
Normetanephrine, 24H Ur: 324 ug/24 hr (ref 82–500)
Normetanephrine, Ur: 162 ug/L
Total Volume: 2000

## 2018-02-20 DIAGNOSIS — E668 Other obesity: Secondary | ICD-10-CM | POA: Diagnosis not present

## 2018-02-20 DIAGNOSIS — R7302 Impaired glucose tolerance (oral): Secondary | ICD-10-CM | POA: Diagnosis not present

## 2018-02-20 DIAGNOSIS — E782 Mixed hyperlipidemia: Secondary | ICD-10-CM | POA: Diagnosis not present

## 2018-02-20 DIAGNOSIS — I10 Essential (primary) hypertension: Secondary | ICD-10-CM | POA: Diagnosis not present

## 2018-02-23 ENCOUNTER — Encounter: Payer: Self-pay | Admitting: Hematology and Oncology

## 2018-02-25 ENCOUNTER — Other Ambulatory Visit: Payer: Self-pay | Admitting: Internal Medicine

## 2018-02-25 ENCOUNTER — Encounter: Payer: Self-pay | Admitting: Hematology and Oncology

## 2018-02-25 ENCOUNTER — Ambulatory Visit
Admission: RE | Admit: 2018-02-25 | Discharge: 2018-02-25 | Disposition: A | Discharge status: Home / Self Care | Payer: Medicare Other | Source: Ambulatory Visit | Attending: Internal Medicine | Admitting: Internal Medicine

## 2018-02-25 DIAGNOSIS — Z1231 Encounter for screening mammogram for malignant neoplasm of breast: Secondary | ICD-10-CM | POA: Insufficient documentation

## 2018-02-28 DIAGNOSIS — M5441 Lumbago with sciatica, right side: Secondary | ICD-10-CM | POA: Diagnosis not present

## 2018-02-28 DIAGNOSIS — M5416 Radiculopathy, lumbar region: Secondary | ICD-10-CM | POA: Diagnosis not present

## 2018-03-08 DIAGNOSIS — M5416 Radiculopathy, lumbar region: Secondary | ICD-10-CM | POA: Diagnosis not present

## 2018-03-08 DIAGNOSIS — M47816 Spondylosis without myelopathy or radiculopathy, lumbar region: Secondary | ICD-10-CM | POA: Diagnosis not present

## 2018-03-08 DIAGNOSIS — M5136 Other intervertebral disc degeneration, lumbar region: Secondary | ICD-10-CM | POA: Diagnosis not present

## 2018-03-11 DIAGNOSIS — R109 Unspecified abdominal pain: Secondary | ICD-10-CM | POA: Diagnosis not present

## 2018-03-11 DIAGNOSIS — I10 Essential (primary) hypertension: Secondary | ICD-10-CM | POA: Diagnosis not present

## 2018-03-11 DIAGNOSIS — E661 Drug-induced obesity: Secondary | ICD-10-CM | POA: Diagnosis not present

## 2018-03-18 DIAGNOSIS — I10 Essential (primary) hypertension: Secondary | ICD-10-CM | POA: Diagnosis not present

## 2018-03-18 DIAGNOSIS — E782 Mixed hyperlipidemia: Secondary | ICD-10-CM | POA: Diagnosis not present

## 2018-03-18 DIAGNOSIS — E668 Other obesity: Secondary | ICD-10-CM | POA: Diagnosis not present

## 2018-03-18 DIAGNOSIS — K5730 Diverticulosis of large intestine without perforation or abscess without bleeding: Secondary | ICD-10-CM | POA: Diagnosis not present

## 2018-04-23 DIAGNOSIS — M5136 Other intervertebral disc degeneration, lumbar region: Secondary | ICD-10-CM | POA: Diagnosis not present

## 2018-04-23 DIAGNOSIS — G4733 Obstructive sleep apnea (adult) (pediatric): Secondary | ICD-10-CM | POA: Diagnosis not present

## 2018-04-23 DIAGNOSIS — R7302 Impaired glucose tolerance (oral): Secondary | ICD-10-CM | POA: Diagnosis not present

## 2018-04-23 DIAGNOSIS — E668 Other obesity: Secondary | ICD-10-CM | POA: Diagnosis not present

## 2018-04-23 DIAGNOSIS — E782 Mixed hyperlipidemia: Secondary | ICD-10-CM | POA: Diagnosis not present

## 2018-04-23 DIAGNOSIS — I10 Essential (primary) hypertension: Secondary | ICD-10-CM | POA: Diagnosis not present

## 2018-06-05 DIAGNOSIS — E782 Mixed hyperlipidemia: Secondary | ICD-10-CM | POA: Diagnosis not present

## 2018-06-05 DIAGNOSIS — I10 Essential (primary) hypertension: Secondary | ICD-10-CM | POA: Diagnosis not present

## 2018-06-05 DIAGNOSIS — M5489 Other dorsalgia: Secondary | ICD-10-CM | POA: Diagnosis not present

## 2018-06-05 DIAGNOSIS — M4187 Other forms of scoliosis, lumbosacral region: Secondary | ICD-10-CM | POA: Diagnosis not present

## 2018-06-06 ENCOUNTER — Other Ambulatory Visit: Payer: Self-pay | Admitting: Internal Medicine

## 2018-06-06 DIAGNOSIS — M25551 Pain in right hip: Secondary | ICD-10-CM

## 2018-06-11 DIAGNOSIS — R079 Chest pain, unspecified: Secondary | ICD-10-CM | POA: Diagnosis not present

## 2018-06-13 ENCOUNTER — Ambulatory Visit: Payer: Medicare Other | Admitting: Physical Therapy

## 2018-06-18 ENCOUNTER — Ambulatory Visit
Admission: RE | Admit: 2018-06-18 | Discharge: 2018-06-18 | Disposition: A | Discharge status: Home / Self Care | Payer: Medicare Other | Source: Ambulatory Visit | Attending: Internal Medicine | Admitting: Internal Medicine

## 2018-06-18 DIAGNOSIS — M4805 Spinal stenosis, thoracolumbar region: Secondary | ICD-10-CM | POA: Diagnosis not present

## 2018-06-18 DIAGNOSIS — M8938 Hypertrophy of bone, other site: Secondary | ICD-10-CM | POA: Diagnosis not present

## 2018-06-18 DIAGNOSIS — M25551 Pain in right hip: Secondary | ICD-10-CM | POA: Diagnosis not present

## 2018-06-18 DIAGNOSIS — M48061 Spinal stenosis, lumbar region without neurogenic claudication: Secondary | ICD-10-CM | POA: Insufficient documentation

## 2018-06-18 DIAGNOSIS — M4807 Spinal stenosis, lumbosacral region: Secondary | ICD-10-CM | POA: Insufficient documentation

## 2018-06-18 DIAGNOSIS — M545 Low back pain: Secondary | ICD-10-CM | POA: Diagnosis not present

## 2018-06-19 DIAGNOSIS — I10 Essential (primary) hypertension: Secondary | ICD-10-CM | POA: Diagnosis not present

## 2018-06-19 DIAGNOSIS — E782 Mixed hyperlipidemia: Secondary | ICD-10-CM | POA: Diagnosis not present

## 2018-06-19 DIAGNOSIS — M5135 Other intervertebral disc degeneration, thoracolumbar region: Secondary | ICD-10-CM | POA: Diagnosis not present

## 2018-06-20 DIAGNOSIS — Z9181 History of falling: Secondary | ICD-10-CM | POA: Diagnosis not present

## 2018-06-20 DIAGNOSIS — M4187 Other forms of scoliosis, lumbosacral region: Secondary | ICD-10-CM | POA: Diagnosis not present

## 2018-06-20 DIAGNOSIS — I10 Essential (primary) hypertension: Secondary | ICD-10-CM | POA: Diagnosis not present

## 2018-06-20 DIAGNOSIS — Z79891 Long term (current) use of opiate analgesic: Secondary | ICD-10-CM | POA: Diagnosis not present

## 2018-06-20 DIAGNOSIS — E119 Type 2 diabetes mellitus without complications: Secondary | ICD-10-CM | POA: Diagnosis not present

## 2018-06-24 DIAGNOSIS — I10 Essential (primary) hypertension: Secondary | ICD-10-CM | POA: Diagnosis not present

## 2018-06-24 DIAGNOSIS — M4187 Other forms of scoliosis, lumbosacral region: Secondary | ICD-10-CM | POA: Diagnosis not present

## 2018-06-24 DIAGNOSIS — Z9181 History of falling: Secondary | ICD-10-CM | POA: Diagnosis not present

## 2018-06-24 DIAGNOSIS — E119 Type 2 diabetes mellitus without complications: Secondary | ICD-10-CM | POA: Diagnosis not present

## 2018-06-24 DIAGNOSIS — Z79891 Long term (current) use of opiate analgesic: Secondary | ICD-10-CM | POA: Diagnosis not present

## 2018-07-10 ENCOUNTER — Encounter: Payer: Self-pay | Admitting: Emergency Medicine

## 2018-07-10 ENCOUNTER — Observation Stay
Admission: EM | Admit: 2018-07-10 | Discharge: 2018-07-11 | Disposition: A | Discharge status: Home / Self Care | Payer: Medicare Other | Attending: Internal Medicine | Admitting: Internal Medicine

## 2018-07-10 ENCOUNTER — Emergency Department: Payer: Medicare Other

## 2018-07-10 ENCOUNTER — Other Ambulatory Visit: Payer: Self-pay | Admitting: Neurosurgery

## 2018-07-10 ENCOUNTER — Other Ambulatory Visit: Payer: Self-pay

## 2018-07-10 DIAGNOSIS — Z79899 Other long term (current) drug therapy: Secondary | ICD-10-CM | POA: Diagnosis not present

## 2018-07-10 DIAGNOSIS — G4733 Obstructive sleep apnea (adult) (pediatric): Secondary | ICD-10-CM | POA: Insufficient documentation

## 2018-07-10 DIAGNOSIS — I10 Essential (primary) hypertension: Secondary | ICD-10-CM | POA: Insufficient documentation

## 2018-07-10 DIAGNOSIS — M4714 Other spondylosis with myelopathy, thoracic region: Secondary | ICD-10-CM

## 2018-07-10 DIAGNOSIS — F419 Anxiety disorder, unspecified: Secondary | ICD-10-CM | POA: Diagnosis not present

## 2018-07-10 DIAGNOSIS — E785 Hyperlipidemia, unspecified: Secondary | ICD-10-CM | POA: Diagnosis not present

## 2018-07-10 DIAGNOSIS — M4800 Spinal stenosis, site unspecified: Secondary | ICD-10-CM | POA: Diagnosis not present

## 2018-07-10 DIAGNOSIS — R079 Chest pain, unspecified: Principal | ICD-10-CM | POA: Diagnosis present

## 2018-07-10 DIAGNOSIS — R0789 Other chest pain: Secondary | ICD-10-CM | POA: Diagnosis not present

## 2018-07-10 DIAGNOSIS — R0602 Shortness of breath: Secondary | ICD-10-CM | POA: Diagnosis not present

## 2018-07-10 DIAGNOSIS — Z87891 Personal history of nicotine dependence: Secondary | ICD-10-CM | POA: Diagnosis not present

## 2018-07-10 DIAGNOSIS — Z888 Allergy status to other drugs, medicaments and biological substances status: Secondary | ICD-10-CM | POA: Diagnosis not present

## 2018-07-10 DIAGNOSIS — Z6841 Body Mass Index (BMI) 40.0 and over, adult: Secondary | ICD-10-CM | POA: Diagnosis not present

## 2018-07-10 DIAGNOSIS — F329 Major depressive disorder, single episode, unspecified: Secondary | ICD-10-CM | POA: Insufficient documentation

## 2018-07-10 DIAGNOSIS — Z885 Allergy status to narcotic agent status: Secondary | ICD-10-CM | POA: Diagnosis not present

## 2018-07-10 DIAGNOSIS — M1990 Unspecified osteoarthritis, unspecified site: Secondary | ICD-10-CM | POA: Insufficient documentation

## 2018-07-10 DIAGNOSIS — M549 Dorsalgia, unspecified: Secondary | ICD-10-CM | POA: Diagnosis not present

## 2018-07-10 LAB — CBC
HEMATOCRIT: 37.7 % (ref 35.0–47.0)
Hemoglobin: 13.2 g/dL (ref 12.0–16.0)
MCH: 33 pg (ref 26.0–34.0)
MCHC: 35.1 g/dL (ref 32.0–36.0)
MCV: 93.9 fL (ref 80.0–100.0)
Platelets: 266 10*3/uL (ref 150–440)
RBC: 4.01 MIL/uL (ref 3.80–5.20)
RDW: 13 % (ref 11.5–14.5)
WBC: 6.6 10*3/uL (ref 3.6–11.0)

## 2018-07-10 LAB — FIBRIN DERIVATIVES D-DIMER (ARMC ONLY): FIBRIN DERIVATIVES D-DIMER (ARMC): 1037.21 ng{FEU}/mL — AB (ref 0.00–499.00)

## 2018-07-10 LAB — BASIC METABOLIC PANEL
Anion gap: 8 (ref 5–15)
BUN: 15 mg/dL (ref 8–23)
CALCIUM: 9.5 mg/dL (ref 8.9–10.3)
CO2: 27 mmol/L (ref 22–32)
Chloride: 104 mmol/L (ref 98–111)
Creatinine, Ser: 1.01 mg/dL — ABNORMAL HIGH (ref 0.44–1.00)
GFR calc Af Amer: >60 mL/min (ref >60)
GFR, EST NON AFRICAN AMERICAN: 55 mL/min — AB (ref >60)
GLUCOSE: 170 mg/dL — AB (ref 70–99)
POTASSIUM: 4.1 mmol/L (ref 3.5–5.1)
SODIUM: 139 mmol/L (ref 135–145)

## 2018-07-10 LAB — BRAIN NATRIURETIC PEPTIDE: B NATRIURETIC PEPTIDE 5: 52 pg/mL (ref 0.0–100.0)

## 2018-07-10 LAB — TROPONIN I

## 2018-07-10 MED ORDER — HEPARIN SODIUM (PORCINE) 5000 UNIT/ML IJ SOLN
5000.0000 [IU] | Freq: Three times a day (TID) | INTRAMUSCULAR | Status: DC
  Administered 2018-07-11 (×3): 5000 [IU] via SUBCUTANEOUS
  Filled 2018-07-10 (×3): qty 1

## 2018-07-10 MED ORDER — LOSARTAN POTASSIUM-HCTZ 100-12.5 MG PO TABS: 1.0000 | ORAL_TABLET | Freq: Every day | ORAL | Status: DC

## 2018-07-10 MED ORDER — DOCUSATE SODIUM 100 MG PO CAPS: 100.0000 mg | ORAL_CAPSULE | Freq: Two times a day (BID) | ORAL | Status: DC

## 2018-07-10 MED ORDER — BISACODYL 5 MG PO TBEC: 5.0000 mg | DELAYED_RELEASE_TABLET | Freq: Every day | ORAL | Status: DC | PRN

## 2018-07-10 MED ORDER — ADULT MULTIVITAMIN W/MINERALS CH
1.0000 | ORAL_TABLET | Freq: Every day | ORAL | Status: DC
  Administered 2018-07-11: 1 via ORAL

## 2018-07-10 MED ORDER — KETOROLAC TROMETHAMINE 30 MG/ML IJ SOLN
30.0000 mg | Freq: Three times a day (TID) | INTRAMUSCULAR | Status: DC | PRN
  Administered 2018-07-11 (×2): 30 mg via INTRAVENOUS
  Filled 2018-07-10 (×2): qty 1

## 2018-07-10 MED ORDER — HYDROCHLOROTHIAZIDE 12.5 MG PO CAPS
12.5000 mg | ORAL_CAPSULE | Freq: Every day | ORAL | Status: DC
  Administered 2018-07-11: 12.5 mg via ORAL
  Filled 2018-07-10: qty 1

## 2018-07-10 MED ORDER — TRAZODONE HCL 50 MG PO TABS: 25.0000 mg | ORAL_TABLET | Freq: Every evening | ORAL | Status: DC | PRN

## 2018-07-10 MED ORDER — ONDANSETRON HCL 4 MG PO TABS: 4.0000 mg | ORAL_TABLET | Freq: Four times a day (QID) | ORAL | Status: DC | PRN

## 2018-07-10 MED ORDER — LOSARTAN POTASSIUM 50 MG PO TABS
100.0000 mg | ORAL_TABLET | Freq: Every day | ORAL | Status: DC
  Administered 2018-07-11: 100 mg via ORAL
  Filled 2018-07-10: qty 2

## 2018-07-10 MED ORDER — ONDANSETRON HCL 4 MG/2ML IJ SOLN
4.0000 mg | Freq: Four times a day (QID) | INTRAMUSCULAR | Status: DC | PRN
  Administered 2018-07-11: 4 mg via INTRAVENOUS
  Filled 2018-07-10: qty 2

## 2018-07-10 MED ORDER — IOPAMIDOL (ISOVUE-370) INJECTION 76%
75.0000 mL | Freq: Once | INTRAVENOUS | Status: AC | PRN
  Administered 2018-07-10: 75 mL via INTRAVENOUS

## 2018-07-10 MED ORDER — HYDROCODONE-ACETAMINOPHEN 5-325 MG PO TABS
1.0000 | ORAL_TABLET | ORAL | Status: DC | PRN
  Filled 2018-07-10: qty 1

## 2018-07-10 MED ORDER — PRAVASTATIN SODIUM 20 MG PO TABS: 20.0000 mg | ORAL_TABLET | Freq: Every day | ORAL | Status: DC

## 2018-07-10 MED ORDER — PREGABALIN 50 MG PO CAPS: 50.0000 mg | ORAL_CAPSULE | Freq: Two times a day (BID) | ORAL | Status: DC

## 2018-07-10 MED ORDER — METHYLPREDNISOLONE SODIUM SUCC 125 MG IJ SOLR
80.0000 mg | Freq: Two times a day (BID) | INTRAMUSCULAR | Status: DC
  Administered 2018-07-11 (×2): 80 mg via INTRAVENOUS
  Filled 2018-07-10 (×2): qty 2

## 2018-07-10 NOTE — ED Notes (Signed)
Report called to RN on floor and pt updated with plan of care. VSS.

## 2018-07-10 NOTE — H&P (Addendum)
Taylor Barry at [facility] NAME: Taylor Barry    MR#:  [id number]  DATE OF BIRTH:  1946-09-12  DATE OF ADMISSION:  07/10/2018  PRIMARY CARE PHYSICIAN: Perrin Maltese, MD   REQUESTING/REFERRING PHYSICIAN:   CHIEF COMPLAINT:   Chief Complaint  Patient presents with  . Shortness of Breath  . Chest Pain    HISTORY OF PRESENT ILLNESS: Taylor Barry  is a 72 y.o. female with a known history of sleep apnea, hypertension, anxiety/depression disorder. Patient presented to emergency room for acute onset of retrosternal chest pain, associated with shortness of breath.  Patient had 2 episodes earlier today.  First episode started in the morning, when she woke up and the second episode happened while she was at a doctor's appointment, for spinal stenosis.  The chest pain is described as sharp, 9 out of 10 in severity, radiating to the back along the rib line.  Per patient, the chest pain seemed to exacerbate with deep inspiration.  The chest pain resolved in the emergency room, after receiving aspirin and nitroglycerin. Blood test done emergency room are notable for creatinine level at 1.01.  CBC is within normal limits.  Troponin level is less than 0.03.  CT angio of the chest, reviewed by myself is negative for PE, infiltrates or fluid. EKG shows normal sinus rhythm with a rate of 85, normal axis, no acute ischemic changes. Patient is admitted for further evaluation and treatment.  PAST MEDICAL HISTORY:   Past Medical History:  Diagnosis Date  . Allergic rhinitis with postnasal drip   . Anxiety and depression   . Arthritis, multiple joint involvement   . Bronchitis, acute, with bronchospasm   . Hair loss   . HTN, goal below 150/90   . Hyperlipidemia LDL goal <100   . Obstructive sleep apnea, adult   . Right adrenal mass ([place])   . Vitamin D deficiency     PAST SURGICAL HISTORY:  Past Surgical History:  Procedure Laterality Date  . ABDOMINAL  HYSTERECTOMY  1968   total  . REPLACEMENT TOTAL KNEE Bilateral    Right knee first, Left knee more recent  . TYMPANOPLASTY Right 1977    SOCIAL HISTORY:  Social History   Tobacco Use  . Smoking status: Former Smoker    Packs/day: 0.25    Last attempt to quit: 01/25/1995    Years since quitting: 23.4  . Smokeless tobacco: Never Used  Substance Use Topics  . Alcohol use: No    Alcohol/week: 0.0 oz    FAMILY HISTORY:  Family History  Problem Relation Age of Onset  . Heart disease Mother   . Alcohol abuse Father   . Diabetes Daughter   . Breast cancer Maternal Aunt   . Cancer Brother   . Cancer Maternal Grandmother   . Cancer Maternal Grandfather     DRUG ALLERGIES:  Allergies  Allergen Reactions  . Codeine Nausea Only  . Hydrocodone-Acetaminophen Other (See Comments)    "Puts me up the wall"  . Oxycodone-Acetaminophen     Hallucination  . Prednisolone     Hallucination    REVIEW OF SYSTEMS:   CONSTITUTIONAL: No fever, fatigue or weakness.  EYES: No blurred or double vision.  EARS, NOSE, AND THROAT: No tinnitus or ear pain.  RESPIRATORY: No cough, shortness of breath, wheezing or hemoptysis.  CARDIOVASCULAR: Positive for chest pain, no orthopnea, edema.  GASTROINTESTINAL: No nausea, vomiting, diarrhea or abdominal pain.  GENITOURINARY: No dysuria,  hematuria.  ENDOCRINE: No polyuria, nocturia,  HEMATOLOGY: No anemia, easy bruising or bleeding SKIN: No rash or lesion. MUSCULOSKELETAL: Positive for chronic back pain secondary to spinal stenosis.   NEUROLOGIC: No focal weakness.  PSYCHIATRY: Positive history of anxiety and depression.   MEDICATIONS AT HOME:  Prior to Admission medications   Medication Sig Start Date End Date Taking? Authorizing Provider  celecoxib (CELEBREX) 200 MG capsule Take 200 mg by mouth daily.   Yes [provider]  losartan-hydrochlorothiazide (HYZAAR) 100-12.5 MG tablet Take 1 tablet by mouth daily. 02/16/16  Yes Bobetta Lime, MD  [place] 50 MG capsule Take 50 mg by mouth 2 (two) times daily.   Yes [provider]  Multiple Vitamins-Minerals (MULTIVITAMIN WITH MINERALS) tablet Take 1 tablet by mouth daily.   Yes [provider]  Pitavastatin Calcium (LIVALO) 2 MG TABS Take 2 mg by mouth daily.   Yes [provider]      PHYSICAL EXAMINATION:   VITAL SIGNS: Blood pressure (!) 153/56, pulse 69, temperature 98.5 F (36.9 C), temperature source Oral, resp. rate 18, height 5\' 6"  (1.676 m), weight 120.2 kg (265 lb), SpO2 94 %.  GENERAL:  72 y.o.-year-old patient lying in the bed with moderate distress, secondary to upper lower back pain.  EYES: Pupils equal, round, reactive to light and accommodation. No scleral icterus. Extraocular muscles intact.  HEENT: Head atraumatic, normocephalic. Oropharynx and nasopharynx clear.  NECK:  Supple, no jugular venous distention. No thyroid enlargement, no tenderness.  LUNGS: Normal breath sounds bilaterally, no wheezing, rales,rhonchi or crepitation. No use of accessory muscles of respiration.  CARDIOVASCULAR: S1, S2 normal. No S3/S4.  ABDOMEN: Soft, nontender, nondistended. Bowel sounds present. No organomegaly or mass.  EXTREMITIES: No pedal edema, cyanosis, or clubbing.  NEUROLOGIC: Cranial nerves II through XII are intact. Muscle strength 5/5 in all extremities. Sensation intact. MUSCULOSKELETAL: There is reduced range of motion thoracic and lumbar spine levels. PSYCHIATRIC: The patient is alert and oriented x 3.  SKIN: No obvious rash, lesion, or ulcer.   LABORATORY PANEL:   CBC Recent Labs  Lab 07/10/18 1851  WBC 6.6  HGB 13.2  HCT 37.7  PLT 266  MCV 93.9  MCH 33.0  MCHC 35.1  RDW 13.0   ------------------------------------------------------------------------------------------------------------------  Chemistries  Recent Labs  Lab 07/10/18 1851  NA 139  K 4.1  CL 104  CO2 27  GLUCOSE 170*  BUN 15  CREATININE 1.01*   CALCIUM 9.5   ------------------------------------------------------------------------------------------------------------------ estimated creatinine clearance is 67.5 mL/min (A) (by C-G formula based on SCr of 1.01 mg/dL (H)). ------------------------------------------------------------------------------------------------------------------ No results for input(s): TSH, T4TOTAL, T3FREE, THYROIDAB in the last 72 hours.  Invalid input(s): FREET3   Coagulation profile No results for input(s): INR, PROTIME in the last 168 hours. ------------------------------------------------------------------------------------------------------------------- No results for input(s): DDIMER in the last 72 hours. -------------------------------------------------------------------------------------------------------------------  Cardiac Enzymes Recent Labs  Lab 07/10/18 1851  TROPONINI <0.03   ------------------------------------------------------------------------------------------------------------------ Invalid input(s): POCBNP  ---------------------------------------------------------------------------------------------------------------  Urinalysis    Component Value Date/Time   COLORURINE Yellow 09/09/2014 0854   APPEARANCEUR Clear 09/09/2014 0854   LABSPEC 1.018 09/09/2014 0854   PHURINE 5.0 09/09/2014 0854   GLUCOSEU Negative 09/09/2014 0854   HGBUR Negative 09/09/2014 0854   BILIRUBINUR Negative 09/09/2014 0854   KETONESUR Negative 09/09/2014 0854   PROTEINUR Negative 09/09/2014 0854   NITRITE Negative 09/09/2014 0854   LEUKOCYTESUR Negative 09/09/2014 0854     RADIOLOGY: Dg Chest 2 View  Result Date: 07/10/2018 CLINICAL DATA:  Shortness of breath.  Back pain.  Chest pain. EXAM: CHEST - 2 VIEW COMPARISON:  02/11/2018 FINDINGS: Atherosclerotic calcification of the aortic arch. Heart size within normal limits. The lungs appear clear. Mild degenerative AC joint arthropathy  bilaterally. Mild spondylosis. No pleural effusion. IMPRESSION: 1.  No active cardiopulmonary disease is radiographically apparent. 2.  Aortic Atherosclerosis (ICD10-I70.0). Electronically Signed   By: Van Clines M.D.   On: 07/10/2018 18:44   Ct Angio Chest Pe W And/or Wo Contrast  Result Date: 07/10/2018 CLINICAL DATA:  Shortness of breath, chest pain, back pain EXAM: CT ANGIOGRAPHY CHEST WITH CONTRAST TECHNIQUE: Multidetector CT imaging of the chest was performed using the standard protocol during bolus administration of intravenous contrast. Multiplanar CT image reconstructions and MIPs were obtained to evaluate the vascular anatomy. CONTRAST:  81mL ISOVUE-370 IOPAMIDOL (ISOVUE-370) INJECTION 76% COMPARISON:  02/11/2018 FINDINGS: Cardiovascular: Heart size upper limits normal. No pericardial effusion. Satisfactory opacification of pulmonary arteries noted, and there is no evidence of pulmonary emboli. Coronary calcifications. Adequate contrast opacification of the thoracic aorta with no evidence of dissection, aneurysm, or stenosis. There is classic 3-vessel brachiocephalic arch anatomy without proximal stenosis. Coarse atheromatous calcifications in the arch and descending thoracic segment. Mediastinum/Nodes: No hilar or mediastinal adenopathy. Lungs/Pleura: No pleural effusion. No pneumothorax. Stable 8 mm right apical nodule. Scarring or subsegmental atelectasis laterally at the left lung base. Lungs otherwise clear. Upper Abdomen: 3.1 cm benign right adrenal adenoma stable. No acute findings. Musculoskeletal: Sternoclavicular DJD. No fracture or worrisome bone lesion. Review of the MIP images confirms the above findings. IMPRESSION: 1. Negative for acute PE or thoracic aortic dissection. 2. Coronary and Aortic Atherosclerosis (ICD10-170.0). Electronically Signed   By: Lucrezia Europe M.D.   On: 07/10/2018 21:07    EKG: Orders placed or performed during the hospital encounter of 07/10/18  . ED EKG  within 10 minutes  . ED EKG within 10 minutes    IMPRESSION AND PLAN:  1.  Chest pain, likely musculoskeletal in nature, possibly related to thoracic radiculopathy.  Will check thoracic spine MRI. Also, will rule out ACS.  Continue to monitor patient on telemetry and follow troponin levels; will check 2D echo and stress test.  Cardiology is consulted for further evaluation and treatment. Continue pain control with Toradol and Solu-Medrol IV. 2.  Hypertension, stable, restart home medications. 3. OSA, continue CPAP at night.  All the records are reviewed and case discussed with ED provider. Management plans discussed with the patient, family and they are in agreement.  CODE STATUS: Full Advance Directive Documentation     Most Recent Value  Type of Advance Directive  Healthcare Power of Attorney  Pre-existing out of facility DNR order (yellow form or pink MOST form)  -  "MOST" Form in Place?  -       TOTAL TIME TAKING CARE OF THIS PATIENT: 47 minutes.    Amelia Jo M.D on 07/10/2018 at 9:59 PM  Between 7am to 6pm - Pager - [phone]  After 6pm go to [email or web address] - password [email or web address] [facility]  Office  [phone]  CC: Primary care physician; Perrin Maltese, MD

## 2018-07-10 NOTE — ED Provider Notes (Signed)
[facility] Emergency Department Provider Note    First MD Initiated Contact with Patient 07/10/18 1819     (approximate)  I have reviewed the triage vital signs and the nursing notes.   HISTORY  Chief Complaint Shortness of Breath and Chest Pain    HPI Taylor Barry is a 72 y.o. female history of anxiety depression as well as sleep apnea on nightly sleep CPAP but no home O2 requirement as well as hypertension hyperlipidemia and obesity presents to the ER with chief complaint of chest pain or shortness of breath.  States that the pain initially started this morning when she woke up having some pain when taking deep inspiration.  Patient denies any pain radiating through to her back.  Had another episode this afternoon after she was seeing her spine doctor.  Denies any pain radiating up to the shoulder.  No fevers.  No cough.  Does feel like her legs have been more swollen.  Denies any history of congestive heart failure.    Past Medical History:  Diagnosis Date  . Allergic rhinitis with postnasal drip   . Anxiety and depression   . Arthritis, multiple joint involvement   . Bronchitis, acute, with bronchospasm   . Hair loss   . HTN, goal below 150/90   . Hyperlipidemia LDL goal <100   . Obstructive sleep apnea, adult   . Right adrenal mass ([place])   . Vitamin D deficiency    Family History  Problem Relation Age of Onset  . Heart disease Mother   . Alcohol abuse Father   . Diabetes Daughter   . Breast cancer Maternal Aunt   . Cancer Brother   . Cancer Maternal Grandmother   . Cancer Maternal Grandfather    Past Surgical History:  Procedure Laterality Date  . ABDOMINAL HYSTERECTOMY  1968   total  . REPLACEMENT TOTAL KNEE Bilateral    Right knee first, Left knee more recent  . TYMPANOPLASTY Right 1977   Patient Active Problem List   Diagnosis Date Noted  . Medicare annual wellness visit, subsequent 09/22/2015  . Major depressive disorder,  recurrent episode, in partial remission with anxious distress ([place]) 09/22/2015  . Insomnia 09/22/2015  . Need for immunization against influenza 09/22/2015  . Benign hypertension with CKD (chronic kidney disease), stage II 07/29/2015  . Adrenal mass ([place]) 07/28/2015  . Allergic rhinitis with postnasal drip 07/28/2015  . Anxiety and depression 07/28/2015  . Osteoarthritis of multiple joints 07/28/2015  . Alopecia 07/28/2015  . Hyperlipidemia LDL goal <100 07/28/2015  . Hypertension goal BP (blood pressure) < 150/90 07/28/2015  . Obstructive sleep apnea of adult 07/28/2015  . Borderline diabetes 07/28/2015  . Avitaminosis D 07/28/2015  . Adenomatous colon polyp 07/28/2015  . Other fatigue 07/28/2015  . H/O total knee replacement 10/08/2014  . Arthritis of knee, degenerative 07/24/2014      Prior to Admission medications   Medication Sig Start Date End Date Taking? Authorizing Provider  aspirin 81 MG chewable tablet Chew 1 tablet by mouth daily.    [provider]  Cholecalciferol (VITAMIN D-3) 5000 units TABS Take by mouth.    [provider]  clonazePAM (KLONOPIN) 1 MG tablet Take 1 mg by mouth at bedtime. 01/22/18   [provider]  losartan-hydrochlorothiazide (HYZAAR) 100-12.5 MG tablet Take 1 tablet by mouth daily. 02/16/16   Bobetta Lime, MD  Multiple Vitamins-Minerals (MULTIVITAMIN WITH MINERALS) tablet Take 1 tablet by mouth daily.    [provider]  pantoprazole (PROTONIX) 40 MG tablet Take 40 mg by mouth daily. 11/28/16   [provider]  Pitavastatin Calcium (LIVALO) 2 MG TABS Take 2 mg by mouth daily.    [provider]  Vitamin D, Ergocalciferol, (DRISDOL) 50000 units CAPS capsule Take 50,000 Units by mouth every 7 (seven) days.    [provider]    Allergies Codeine; Oxycodone-acetaminophen; and Prednisolone    Social History Social History   Tobacco Use  . Smoking status: Former Smoker     Packs/day: 0.25    Last attempt to quit: 01/25/1995    Years since quitting: 23.4  . Smokeless tobacco: Never Used  Substance Use Topics  . Alcohol use: No    Alcohol/week: 0.0 oz  . Drug use: No    Review of Systems Patient denies headaches, rhinorrhea, blurry vision, numbness, shortness of breath, chest pain, edema, cough, abdominal pain, nausea, vomiting, diarrhea, dysuria, fevers, rashes or hallucinations unless otherwise stated above in HPI. ____________________________________________   PHYSICAL EXAM:  VITAL SIGNS: Vitals:   07/10/18 1811 07/10/18 2000  BP: (!) 162/85 (!) 153/56  Pulse: 77 69  Resp: (!) 22 18  Temp: 98.5 F (36.9 C)   SpO2: 97% 94%    Constitutional: Alert and oriented.  Eyes: Conjunctivae are normal.  Head: Atraumatic. Nose: No congestion/rhinnorhea. Mouth/Throat: Mucous membranes are moist.   Neck: No stridor. Painless ROM.  Cardiovascular: Normal rate, regular rhythm. Grossly normal heart sounds.  Good peripheral circulation. Respiratory: Normal respiratory effort.  No retractions. Lungs CTAB. Gastrointestinal: Soft and nontender. No distention. No abdominal bruits. No CVA tenderness. Genitourinary: deferred Musculoskeletal: No lower extremity tenderness nor edema.  No joint effusions. Neurologic:  Normal speech and language. No gross focal neurologic deficits are appreciated. No facial droop Skin:  Skin is warm, dry and intact. No rash noted. Psychiatric: Mood and affect are normal. Speech and behavior are normal.  ____________________________________________   LABS (all labs ordered are listed, but only abnormal results are displayed)  Results for orders placed or performed during the hospital encounter of 07/10/18 (from the past 24 hour(s))  Basic metabolic panel     Status: Abnormal   Collection Time: 07/10/18  6:51 PM  Result Value Ref Range   Sodium 139 135 - 145 mmol/L   Potassium 4.1 3.5 - 5.1 mmol/L   Chloride 104 98 - 111 mmol/L     CO2 27 22 - 32 mmol/L   Glucose, Bld 170 (H) 70 - 99 mg/dL   BUN 15 8 - 23 mg/dL   Creatinine, Ser 1.01 (H) 0.44 - 1.00 mg/dL   Calcium 9.5 8.9 - 10.3 mg/dL   GFR calc non Af Amer 55 (L) >60 mL/min   GFR calc Af Amer >60 >60 mL/min   Anion gap 8 5 - 15  CBC     Status: None   Collection Time: 07/10/18  6:51 PM  Result Value Ref Range   WBC 6.6 3.6 - 11.0 K/uL   RBC 4.01 3.80 - 5.20 MIL/uL   Hemoglobin 13.2 12.0 - 16.0 g/dL   HCT 37.7 35.0 - 47.0 %   MCV 93.9 80.0 - 100.0 fL   MCH 33.0 26.0 - 34.0 pg   MCHC 35.1 32.0 - 36.0 g/dL   RDW 13.0 11.5 - 14.5 %   Platelets 266 150 - 440 K/uL  Troponin I     Status: None   Collection Time: 07/10/18  6:51 PM  Result Value Ref Range  Troponin I <0.03 <0.03 ng/mL  Brain natriuretic peptide     Status: None   Collection Time: 07/10/18  6:51 PM  Result Value Ref Range   B Natriuretic Peptide 52.0 0.0 - 100.0 pg/mL  Fibrin derivatives D-Dimer ([facility] only)     Status: Abnormal   Collection Time: 07/10/18  6:51 PM  Result Value Ref Range   Fibrin derivatives D-dimer ([facility]) 1,037.21 (H) 0.00 - 499.00 ng/mL (FEU)   ____________________________________________  EKG My review and personal interpretation at Time: 18:06   Indication: chest pain  Rate: 85  Rhythm: sinus Axis: normal Other: normal intervals, no stemi ____________________________________________  RADIOLOGY  I personally reviewed all radiographic images ordered to evaluate for the above acute complaints and reviewed radiology reports and findings.  These findings were personally discussed with the patient.  Please see medical record for radiology report.  ____________________________________________   PROCEDURES  Procedure(s) performed:  Procedures    Critical Care performed: no ____________________________________________   INITIAL IMPRESSION / ASSESSMENT AND PLAN / ED COURSE  Pertinent labs & imaging results that were available during my care of the patient were  reviewed by me and considered in my medical decision making (see chart for details).   DDX: ACS, pericarditis, esophagitis, boerhaaves, pe, dissection, pna, bronchitis, costochondritis   TINZLEY DALIA is a 72 y.o. who presents to the ED with symptoms as described above.  Patient presenting with some typical and atypical chest pain features.  Currently has no evidence of acute ischemia on EKG and her initial troponin is negative however the patient has significant high risk based on her comorbidities age.  Patient also was unable to tolerate outpatient stress testing with no recent cardiac cath.  Does describe some pleuritic chest pain therefore d-dimer was sent to evaluate and further stratify for PE.  D-dimer elevated therefore CT angiogram ordered.  Based on her risk factors and symptoms improving after nitroglycerin do feel patient should be admitted to the hospital for chest pain rule out.  On my review of the CT angiogram I do not see any evidence of large vessel occlusion or saddle PE.  Possible subsegmental.  Patient will be admitted to the hospitalist for further evaluation and management.      As part of my medical decision making, I reviewed the following data within the [facility] notes reviewed and incorporated, Labs reviewed, notes from prior ED visits.   ____________________________________________   FINAL CLINICAL IMPRESSION(S) / ED DIAGNOSES  Final diagnoses:  Chest pain, unspecified type      NEW MEDICATIONS STARTED DURING THIS VISIT:  New Prescriptions   No medications on file     Note:  This document was prepared using Dragon voice recognition software and may include unintentional dictation errors.    Merlyn Lot, MD 07/10/18 2110

## 2018-07-10 NOTE — ED Triage Notes (Signed)
Pt reports that she was at the spine doctor earlier today and developed some shortness of breath. Her family wanted her to go to the ER, she refused, she told them if she had another episode of SOB she would call EMS. EMS brought her to ED. She was complaining of chest pain, back pain and SOB. They gave her 324 of asa and Nitro. She reports that it helped with the chest pain

## 2018-07-10 NOTE — ED Notes (Signed)
Patient transported to X-ray 

## 2018-07-11 ENCOUNTER — Observation Stay
Admit: 2018-07-11 | Discharge: 2018-07-11 | Disposition: A | Discharge status: Home / Self Care | Payer: Medicare Other | Attending: Internal Medicine | Admitting: Internal Medicine

## 2018-07-11 ENCOUNTER — Observation Stay: Admit: 2018-07-11 | Payer: Medicare Other

## 2018-07-11 ENCOUNTER — Observation Stay: Payer: Medicare Other

## 2018-07-11 DIAGNOSIS — R0789 Other chest pain: Secondary | ICD-10-CM | POA: Diagnosis not present

## 2018-07-11 DIAGNOSIS — I10 Essential (primary) hypertension: Secondary | ICD-10-CM | POA: Diagnosis not present

## 2018-07-11 DIAGNOSIS — R9431 Abnormal electrocardiogram [ECG] [EKG]: Secondary | ICD-10-CM | POA: Diagnosis not present

## 2018-07-11 DIAGNOSIS — R079 Chest pain, unspecified: Secondary | ICD-10-CM | POA: Diagnosis not present

## 2018-07-11 DIAGNOSIS — M4800 Spinal stenosis, site unspecified: Secondary | ICD-10-CM | POA: Diagnosis not present

## 2018-07-11 LAB — CBC
HCT: 39.8 % (ref 35.0–47.0)
HEMOGLOBIN: 13.5 g/dL (ref 12.0–16.0)
MCH: 32.1 pg (ref 26.0–34.0)
MCHC: 34 g/dL (ref 32.0–36.0)
MCV: 94.6 fL (ref 80.0–100.0)
Platelets: 269 10*3/uL (ref 150–440)
RBC: 4.2 MIL/uL (ref 3.80–5.20)
RDW: 13.2 % (ref 11.5–14.5)
WBC: 6 10*3/uL (ref 3.6–11.0)

## 2018-07-11 LAB — NM MYOCAR MULTI W/SPECT W/WALL MOTION / EF
CHL CUP NUCLEAR SSS: 1
CSEPEDS: 0 s
CSEPHR: 77 %
CSEPPHR: 115 {beats}/min
Estimated workload: 1 METS
Exercise duration (min): 1 min
LV dias vol: 87 mL (ref 46–106)
LV sys vol: 27 mL
MPHR: 149 {beats}/min
Rest HR: 82 {beats}/min
SDS: 0
SRS: 3
TID: 0.53

## 2018-07-11 LAB — BASIC METABOLIC PANEL
Anion gap: 9 (ref 5–15)
BUN: 17 mg/dL (ref 8–23)
CALCIUM: 9.7 mg/dL (ref 8.9–10.3)
CO2: 26 mmol/L (ref 22–32)
Chloride: 105 mmol/L (ref 98–111)
Creatinine, Ser: 1.06 mg/dL — ABNORMAL HIGH (ref 0.44–1.00)
GFR, EST AFRICAN AMERICAN: 60 mL/min — AB (ref >60)
GFR, EST NON AFRICAN AMERICAN: 52 mL/min — AB (ref >60)
GLUCOSE: 174 mg/dL — AB (ref 70–99)
Potassium: 4.4 mmol/L (ref 3.5–5.1)
SODIUM: 140 mmol/L (ref 135–145)

## 2018-07-11 LAB — TROPONIN I: Troponin I: <0.03 ng/mL (ref <0.03)

## 2018-07-11 LAB — ECHOCARDIOGRAM COMPLETE
HEIGHTINCHES: 66 in
WEIGHTICAEL: 4240 [oz_av]

## 2018-07-11 MED ORDER — TECHNETIUM TC 99M TETROFOSMIN IV KIT
30.9000 | PACK | Freq: Once | INTRAVENOUS | Status: AC | PRN
  Administered 2018-07-11: 30.9 via INTRAVENOUS

## 2018-07-11 MED ORDER — REGADENOSON 0.4 MG/5ML IV SOLN
0.4000 mg | Freq: Once | INTRAVENOUS | Status: AC
  Administered 2018-07-11: 0.4 mg via INTRAVENOUS

## 2018-07-11 MED ORDER — INSULIN ASPART 100 UNIT/ML ~~LOC~~ SOLN: 0.0000 [IU] | Freq: Four times a day (QID) | SUBCUTANEOUS | Status: DC

## 2018-07-11 MED ORDER — TECHNETIUM TC 99M TETROFOSMIN IV KIT
13.5300 | PACK | Freq: Once | INTRAVENOUS | Status: AC | PRN
  Administered 2018-07-11: 13.53 via INTRAVENOUS

## 2018-07-11 NOTE — Progress Notes (Signed)
Patient discharged via wheelchair and private vehicle. IV removed and catheter intact. All discharge instructions given and patient verbalizes understanding. Tele removed and returned. No prescriptions given to patient No distress noted.   

## 2018-07-11 NOTE — Progress Notes (Signed)
Patient has no acute event overnight. On admission patient was A&O X4, ambulatory denied being in pain and accompanied by her daughter. Patient was oriented to the [facility] plan reviewed with patient and daughter , and assisted as needed.

## 2018-07-11 NOTE — Discharge Instructions (Signed)
It was a pleasure taking care of you!  You came into the hospital because you were having chest pain. You had a stress test done that was completely normal. You also had an ECHO (ultrasound of your heart), which was normal too. You should follow-up with your primary care doctor in the next week for a follow-up appointment.   Please make sure you get the MRI of your back done when you leave the hospital.  If you have any more chest pain, please come back to the hospital.

## 2018-07-11 NOTE — Progress Notes (Signed)
Patient elevated blood pressure was called to Dr. Cathlean Sauer. Per Dr. Cathlean Sauer patient's 10:00 AM blood pressure meds were administered earlier than scheduled. Will continue to monitor.   Patient was kept NPO overnight for possible scheduled stress test in AM

## 2018-07-11 NOTE — Discharge Summary (Signed)
[facility] [place] NAME: Taylor Barry    MR#:  [id number]  DATE OF BIRTH:  1946-11-07  DATE OF ADMISSION:  07/10/2018   ADMITTING PHYSICIAN: Amelia Jo, MD  DATE OF DISCHARGE: No discharge date for patient encounter.  PRIMARY CARE PHYSICIAN: Perrin Maltese, MD   ADMISSION DIAGNOSIS:  Chest pain, unspecified type [R07.9] DISCHARGE DIAGNOSIS:  Active Problems:   Chest pain  SECONDARY DIAGNOSIS:   Past Medical History:  Diagnosis Date  . Allergic rhinitis with postnasal drip   . Anxiety and depression   . Arthritis, multiple joint involvement   . Bronchitis, acute, with bronchospasm   . Hair loss   . HTN, goal below 150/90   . Hyperlipidemia LDL goal <100   . Obstructive sleep apnea, adult   . Right adrenal mass ([place])   . Vitamin D deficiency    HOSPITAL COURSE:  Taylor Barry is a 72 year old female with a PMH of HTN, OSA, anxiety/depression presenting to the ED with multiple episodes of acute retrosternal chest pain and associated shortness of breath. CTA chest was negative for PE. Patient was admitted for ACS rule out.  1.  Chest pain, likely musculoskeletal in nature, possibly related to thoracic radiculopathy. - Seen by cards. Stress test 07/11/18 was negative. - ECHO with EF of 55-60%. - troponins negative x 2 - MRI thoracic spine was ordered, but patient felt that the MRI machine was too small and preferred that this be done as an outpatient.  2.  Hypertension - blood pressures elevated this admission, continue to monitor as outpatient - home losartan and hctz were continued  3. OSA - CPAP qhs   DISCHARGE CONDITIONS:  Chest pain, likely musculoskeletal Hypertension OSA CONSULTS OBTAINED:  Treatment Team:  Isaias Cowman, MD DRUG ALLERGIES:   Allergies  Allergen Reactions  . Codeine Nausea Only  . Hydrocodone-Acetaminophen Other (See Comments)    "Puts me up the wall"  . Oxycodone-Acetaminophen    Hallucination  . Prednisolone     Hallucination   DISCHARGE MEDICATIONS:   Allergies as of 07/11/2018      Reactions   Codeine Nausea Only   Hydrocodone-acetaminophen Other (See Comments)   "Puts me up the wall"   Oxycodone-acetaminophen    Hallucination   Prednisolone    Hallucination      Medication List    TAKE these medications   celecoxib 200 MG capsule Commonly known as:  CELEBREX Take 200 mg by mouth daily.   LIVALO 2 MG Tabs Generic drug:  Pitavastatin Calcium Take 2 mg by mouth daily.   losartan-hydrochlorothiazide 100-12.5 MG tablet Commonly known as:  HYZAAR Take 1 tablet by mouth daily.   LYRICA 50 MG capsule Generic drug:  pregabalin Take 50 mg by mouth 2 (two) times daily.   multivitamin with minerals tablet Take 1 tablet by mouth daily.        DISCHARGE INSTRUCTIONS:  1. F/u with PCP in 1-2 weeks 2. F/u with cardiology in 2 weeks 3. Patient preferred to have MRI thoracic spine done as an outpatient 4. F/u blood pressures and adjust medications as needed 5. CBGs mildly elevated to the 170s. Last a1c per our records was in 2016. Would recommend repeating as an outpatient. DIET:  Cardiac diet DISCHARGE CONDITION:  Stable ACTIVITY:  Activity as tolerated OXYGEN:  Home Oxygen: No.  Oxygen Delivery: room air DISCHARGE LOCATION:  home   If you experience worsening of your admission symptoms, develop  shortness of breath, life threatening emergency, suicidal or homicidal thoughts you must seek medical attention immediately by calling 911 or calling your MD immediately  if symptoms less severe.  You Must read complete instructions/literature along with all the possible adverse reactions/side effects for all the Medicines you take and that have been prescribed to you. Take any new Medicines after you have completely understood and accpet all the possible adverse reactions/side effects.   Please note  You were cared for by a hospitalist during your  hospital stay. If you have any questions about your discharge medications or the care you received while you were in the hospital after you are discharged, you can call the unit and asked to speak with the hospitalist on call if the hospitalist that took care of you is not available. Once you are discharged, your primary care physician will handle any further medical issues. Please note that NO REFILLS for any discharge medications will be authorized once you are discharged, as it is imperative that you return to your primary care physician (or establish a relationship with a primary care physician if you do not have one) for your aftercare needs so that they can reassess your need for medications and monitor your lab values.    On the day of Discharge:  VITAL SIGNS:  Blood pressure (!) 166/59, pulse 76, temperature 98.2 F (36.8 C), temperature source Oral, resp. rate 20, height 5\' 6"  (1.676 m), weight 120.2 kg (265 lb), SpO2 96 %. PHYSICAL EXAMINATION:  GENERAL:  72 y.o.-year-old patient lying in the bed with no acute distress.  EYES: Pupils equal, round, reactive to light and accommodation. No scleral icterus. Extraocular muscles intact.  HEENT: Head atraumatic, normocephalic. Oropharynx and nasopharynx clear.  NECK:  Supple, no jugular venous distention. No thyroid enlargement, no tenderness.  LUNGS: Normal breath sounds bilaterally, no wheezing, rales,rhonchi or crepitation. No use of accessory muscles of respiration.  CARDIOVASCULAR: S1, S2 normal. No murmurs, rubs, or gallops.  ABDOMEN: Soft, non-tender, non-distended. Bowel sounds present. No organomegaly or mass.  EXTREMITIES: No pedal edema, cyanosis, or clubbing.  NEUROLOGIC: Cranial nerves II through XII are intact. Muscle strength 5/5 in all extremities. Sensation intact. Gait not checked.  PSYCHIATRIC: The patient is alert and oriented x 3.  SKIN: No obvious rash, lesion, or ulcer.  DATA REVIEW:   CBC Recent Labs  Lab  07/11/18 0439  WBC 6.0  HGB 13.5  HCT 39.8  PLT 269    Chemistries  Recent Labs  Lab 07/11/18 0439  NA 140  K 4.4  CL 105  CO2 26  GLUCOSE 174*  BUN 17  CREATININE 1.06*  CALCIUM 9.7     Microbiology Results  No results found for this or any previous visit.  RADIOLOGY:  Dg Chest 2 View  Result Date: 07/10/2018 CLINICAL DATA:  Shortness of breath.  Back pain.  Chest pain. EXAM: CHEST - 2 VIEW COMPARISON:  02/11/2018 FINDINGS: Atherosclerotic calcification of the aortic arch. Heart size within normal limits. The lungs appear clear. Mild degenerative AC joint arthropathy bilaterally. Mild spondylosis. No pleural effusion. IMPRESSION: 1.  No active cardiopulmonary disease is radiographically apparent. 2.  Aortic Atherosclerosis (ICD10-I70.0). Electronically Signed   By: Van Clines M.D.   On: 07/10/2018 18:44   Ct Angio Chest Pe W And/or Wo Contrast  Result Date: 07/10/2018 CLINICAL DATA:  Shortness of breath, chest pain, back pain EXAM: CT ANGIOGRAPHY CHEST WITH CONTRAST TECHNIQUE: Multidetector CT imaging of the chest was performed using the  standard protocol during bolus administration of intravenous contrast. Multiplanar CT image reconstructions and MIPs were obtained to evaluate the vascular anatomy. CONTRAST:  48mL ISOVUE-370 IOPAMIDOL (ISOVUE-370) INJECTION 76% COMPARISON:  02/11/2018 FINDINGS: Cardiovascular: Heart size upper limits normal. No pericardial effusion. Satisfactory opacification of pulmonary arteries noted, and there is no evidence of pulmonary emboli. Coronary calcifications. Adequate contrast opacification of the thoracic aorta with no evidence of dissection, aneurysm, or stenosis. There is classic 3-vessel brachiocephalic arch anatomy without proximal stenosis. Coarse atheromatous calcifications in the arch and descending thoracic segment. Mediastinum/Nodes: No hilar or mediastinal adenopathy. Lungs/Pleura: No pleural effusion. No pneumothorax. Stable 8 mm  right apical nodule. Scarring or subsegmental atelectasis laterally at the left lung base. Lungs otherwise clear. Upper Abdomen: 3.1 cm benign right adrenal adenoma stable. No acute findings. Musculoskeletal: Sternoclavicular DJD. No fracture or worrisome bone lesion. Review of the MIP images confirms the above findings. IMPRESSION: 1. Negative for acute PE or thoracic aortic dissection. 2. Coronary and Aortic Atherosclerosis (ICD10-170.0). Electronically Signed   By: Lucrezia Europe M.D.   On: 07/10/2018 21:07   Nm Myocar Multi W/spect W/wall Motion / Ef  Result Date: 07/11/2018  Blood pressure demonstrated a normal response to exercise.  The left ventricular ejection fraction is hyperdynamic (>65%).  The study is normal.  This is a low risk study.      Management plans discussed with the patient, family and they are in agreement.  CODE STATUS: Full Code   TOTAL TIME TAKING CARE OF THIS PATIENT: 35 minutes.    Berna Spare Mayo M.D on 07/11/2018 at 5:24 PM  Between 7am to 6pm - Pager - [phone]  After 6pm go to [email or web address] - Proofreader  [employer] [facility]  Office  [phone]  CC: Primary care physician; Perrin Maltese, MD   Note: This dictation was prepared with Dragon dictation along with smaller phrase technology. Any transcriptional errors that result from this process are unintentional.

## 2018-07-11 NOTE — Care Management Note (Signed)
Case Management Note  Patient Details  Name: Taylor Barry MRN: [id number] Date of Birth: February 22, 1946  Subjective/Objective: Admitted to [facility] under observation status with the diagnosis of chest pain. Lives alone. Daughter Jeannene Patella is at the bedside ([phone]),Last seen Dr. Humphrey Rolls a few weeks ago. Prescriptions are filled at [facility] in [place] 3 years ago, daughter doesn't remember name of agency. [facility] x 2 weeks following surgery per Dr. Marry Guan. Takes care of all basic activities of daily living herself. No balance issues per daughter. Good appetite,                     Action/Plan: Will continue to follow for discharge plans. Tried to explain MOON to daughter. Wants someone to come back and explain to her mother    Expected Discharge Date:                  Expected Discharge Plan:     In-House Referral:     Discharge planning Services     Post Acute Care Choice:    Choice offered to:     DME Arranged:    DME Agency:     HH Arranged:    [place] Agency:     Status of Service:     If discussed at [employer], dates discussed:    Additional Comments:  Shelbie Ammons, RN MSN CCM Care Management [phone] 07/11/2018, 1:30 PM

## 2018-07-11 NOTE — Progress Notes (Signed)
Chaplain responded to an OR for an AD that the patient already had. Chaplain examine Pts family dynamics. Pt said she wanted all three of her family children to partciipate in the decision making of health care. Chaplain explain what her current health care POA is her children. Chaplain educated her on the [place]. Pt daughter Jeannene Patella is her POA and administrator her will. Family has discussed mothers desires. Mable Fill will do whatever family desires. Pt decided to not fill our AD.    07/11/18 1000  Clinical Encounter Type  Visited With Patient and family together  Visit Type Initial  Referral From Nurse  Spiritual Encounters  Spiritual Needs Brochure;Prayer

## 2018-07-11 NOTE — Progress Notes (Signed)
*  PRELIMINARY RESULTS* Echocardiogram 2D Echocardiogram has been performed.  Taylor Barry 07/11/2018, 1:32 PM

## 2018-07-11 NOTE — Consult Note (Signed)
[facility]  CARDIOLOGY CONSULT NOTE  Patient ID: Taylor Barry MRN: [id number] DOB/AGE: 72-19-1947 72 y.o.  Admit date: 07/10/2018 Referring Physician [facility]  Primary Physician Lamonte Sakai Primary Cardiologist Humphrey Rolls Reason for Consultation Chest pain  HPI: 72 year old female referred for evaluation of chest pain. The patient has a history of essential hypertension, hyperlipidemia, OSA, obesity, and chronic back pain. The patient reports a several week history of worsening lower back pain with associated chest pain, described as sharp and heavy, occurring at rest, with associated shortness of breath. The patient reports waking up at 3AM yesterday morning with this type of chest pain, which lasted about 30-45 minutes after taking a muscle relaxer and aspirin. She had a second episode of chest pain yesterday evening while sitting at home and called EMS. Her pain eased off after she received nitroglycerin and aspirin. ECG revealed sinus rhythm at a rate of 85 bpm without acute ST-T wave abnormalities. Initial troponin less than 0.03. Admission labs notable for d-dimer 1037.21. Chest CT negative for acute pulmonary embolus or thoracic aortic dissection, but did reveal coronary and aortic atherosclerosis. The patient reports that she had an attempted nuclear stress test 1 year ago per Dr. Humphrey Rolls, but it was discontinued due to difficulty obtaining IV access. She denies a history of prior MI. She had a cardiac catheterization in the distant past at [facility] without stent placement. Currently, she denies chest pain, back pain, or shortness of breath.    Review of systems complete and found to be negative unless listed above     Past Medical History:  Diagnosis Date  . Allergic rhinitis with postnasal drip   . Anxiety and depression   . Arthritis, multiple joint involvement   . Bronchitis, acute, with bronchospasm   . Hair loss   . HTN, goal below 150/90   . Hyperlipidemia LDL goal <100   . Obstructive  sleep apnea, adult   . Right adrenal mass ([place])   . Vitamin D deficiency     Past Surgical History:  Procedure Laterality Date  . ABDOMINAL HYSTERECTOMY  1968   total  . REPLACEMENT TOTAL KNEE Bilateral    Right knee first, Left knee more recent  . TYMPANOPLASTY Right 1977    Medications Prior to Admission  Medication Sig Dispense Refill Last Dose  . celecoxib (CELEBREX) 200 MG capsule Take 200 mg by mouth daily.   07/10/2018 at Unknown time  . losartan-hydrochlorothiazide (HYZAAR) 100-12.5 MG tablet Take 1 tablet by mouth daily. 90 tablet 0 07/10/2018 at Unknown time  . LYRICA 50 MG capsule Take 50 mg by mouth 2 (two) times daily.   07/10/2018 at am  . Multiple Vitamins-Minerals (MULTIVITAMIN WITH MINERALS) tablet Take 1 tablet by mouth daily.   07/10/2018 at Unknown time  . Pitavastatin Calcium (LIVALO) 2 MG TABS Take 2 mg by mouth daily.   07/09/2018 at Unknown time   Social History   Socioeconomic History  . Marital status: Divorced    Spouse name: Not on file  . Number of children: Not on file  . Years of education: Not on file  . Highest education level: Not on file  Occupational History  . Not on file  Social Needs  . Financial resource strain: Not on file  . Food insecurity:    Worry: Not on file    Inability: Not on file  . Transportation needs:    Medical: Not on file    Non-medical: Not on file  Tobacco Use  .  Smoking status: Former Smoker    Packs/day: 0.25    Last attempt to quit: 01/25/1995    Years since quitting: 23.4  . Smokeless tobacco: Never Used  Substance and Sexual Activity  . Alcohol use: No    Alcohol/week: 0.0 oz  . Drug use: No  . Sexual activity: Never  Lifestyle  . Physical activity:    Days per week: Not on file    Minutes per session: Not on file  . Stress: Not on file  Relationships  . Social connections:    Talks on phone: Not on file    Gets together: Not on file    Attends religious service: Not on file    Active member of club or  organization: Not on file    Attends meetings of clubs or organizations: Not on file    Relationship status: Not on file  . Intimate partner violence:    Fear of current or ex partner: Not on file    Emotionally abused: Not on file    Physically abused: Not on file    Forced sexual activity: Not on file  Other Topics Concern  . Not on file  Social History Narrative  . Not on file    Family History  Problem Relation Age of Onset  . Heart disease Mother   . Alcohol abuse Father   . Diabetes Daughter   . Breast cancer Maternal Aunt   . Cancer Brother   . Cancer Maternal Grandmother   . Cancer Maternal Grandfather       Review of systems complete and found to be negative unless listed above      PHYSICAL EXAM  General: Well developed, well nourished, in no acute distress, sitting up in chair HEENT:  Normocephalic and atramatic Neck:  No JVD.  Lungs: Clear bilaterally to auscultation, normal effort of breathing on room air. Heart: HRRR . Normal S1 and S2 without gallops or murmurs.  Abdomen: Bowel sounds are positive, abdomen nondistended Msk:  Strength and tone normal for age. Gait not assessed.  Extremities: 1+ bilateral lower extremity edema. Dorsalis pedis pulses palpable bilaterally Neuro: Alert and oriented X 3. Psych:  Good affect, responds appropriately  Labs:   Lab Results  Component Value Date   WBC 6.0 07/11/2018   HGB 13.5 07/11/2018   HCT 39.8 07/11/2018   MCV 94.6 07/11/2018   PLT 269 07/11/2018    Recent Labs  Lab 07/11/18 0439  NA 140  K 4.4  CL 105  CO2 26  BUN 17  CREATININE 1.06*  CALCIUM 9.7  GLUCOSE 174*   Lab Results  Component Value Date   TROPONINI <0.03 07/10/2018    Lab Results  Component Value Date   CHOL 207 (H) 07/28/2015   Lab Results  Component Value Date   HDL 46 07/28/2015   Lab Results  Component Value Date   LDLCALC 131 (H) 07/28/2015   Lab Results  Component Value Date   TRIG 150 (H) 07/28/2015   Lab  Results  Component Value Date   CHOLHDL 4.5 (H) 07/28/2015   No results found for: LDLDIRECT    Radiology: Dg Chest 2 View  Result Date: 07/10/2018 CLINICAL DATA:  Shortness of breath.  Back pain.  Chest pain. EXAM: CHEST - 2 VIEW COMPARISON:  02/11/2018 FINDINGS: Atherosclerotic calcification of the aortic arch. Heart size within normal limits. The lungs appear clear. Mild degenerative AC joint arthropathy bilaterally. Mild spondylosis. No pleural effusion. IMPRESSION: 1.  No active  cardiopulmonary disease is radiographically apparent. 2.  Aortic Atherosclerosis (ICD10-I70.0). Electronically Signed   By: Van Clines M.D.   On: 07/10/2018 18:44   Ct Angio Chest Pe W And/or Wo Contrast  Result Date: 07/10/2018 CLINICAL DATA:  Shortness of breath, chest pain, back pain EXAM: CT ANGIOGRAPHY CHEST WITH CONTRAST TECHNIQUE: Multidetector CT imaging of the chest was performed using the standard protocol during bolus administration of intravenous contrast. Multiplanar CT image reconstructions and MIPs were obtained to evaluate the vascular anatomy. CONTRAST:  48mL ISOVUE-370 IOPAMIDOL (ISOVUE-370) INJECTION 76% COMPARISON:  02/11/2018 FINDINGS: Cardiovascular: Heart size upper limits normal. No pericardial effusion. Satisfactory opacification of pulmonary arteries noted, and there is no evidence of pulmonary emboli. Coronary calcifications. Adequate contrast opacification of the thoracic aorta with no evidence of dissection, aneurysm, or stenosis. There is classic 3-vessel brachiocephalic arch anatomy without proximal stenosis. Coarse atheromatous calcifications in the arch and descending thoracic segment. Mediastinum/Nodes: No hilar or mediastinal adenopathy. Lungs/Pleura: No pleural effusion. No pneumothorax. Stable 8 mm right apical nodule. Scarring or subsegmental atelectasis laterally at the left lung base. Lungs otherwise clear. Upper Abdomen: 3.1 cm benign right adrenal adenoma stable. No acute  findings. Musculoskeletal: Sternoclavicular DJD. No fracture or worrisome bone lesion. Review of the MIP images confirms the above findings. IMPRESSION: 1. Negative for acute PE or thoracic aortic dissection. 2. Coronary and Aortic Atherosclerosis (ICD10-170.0). Electronically Signed   By: Lucrezia Europe M.D.   On: 07/10/2018 21:07   Mr Lumbar Spine Wo Contrast  Result Date: 06/18/2018 CLINICAL DATA:  Low back pain with right hip pain EXAM: MRI LUMBAR SPINE WITHOUT CONTRAST TECHNIQUE: Multiplanar, multisequence MR imaging of the lumbar spine was performed. No intravenous contrast was administered. COMPARISON:  Lumbar MRI 11/11/2009 FINDINGS: Segmentation:  Normal Alignment:  Normal.  Mild dextroscoliosis. Vertebrae: Negative for fracture or mass. Hemangioma L2 vertebral body on the right unchanged. Conus medullaris and cauda equina: Conus extends to the L1-2 level. Conus and cauda equina appear normal. Paraspinal and other soft tissues: Negative for paraspinous mass or soft tissue thickening Disc levels: T12-L1: Subarticular stenosis due to facet hypertrophy. This has developed since the prior study. L1-2: Mild subarticular stenosis on the left due to asymmetric facet disease L2-3: Bilateral facet hypertrophy. Mild disc degeneration. No significant stenosis. L3-4: Mild spinal stenosis. Mild disc bulging and moderate facet hypertrophy bilaterally. Progression of stenosis since the prior MRI L4-5: Moderate spinal stenosis has progressed. Diffuse disc bulging and endplate spurring. Bilateral facet hypertrophy. Moderate subarticular and moderate foraminal stenosis bilaterally. L5-S1: Disc degeneration with disc bulging. Bilateral facet hypertrophy causing moderate subarticular stenosis bilaterally. IMPRESSION: Moderate to severe subarticular stenosis on the left T12-L1 due to facet hypertrophy. Mild subarticular stenosis on the left L1-2 due to facet disease Mild spinal stenosis at L3-4 has progressed Moderate spinal  stenosis L4-5 has progressed. Moderate subarticular and foraminal stenosis bilaterally Moderate subarticular stenosis bilaterally L5-S1. Electronically Signed   By: Franchot Gallo M.D.   On: 06/18/2018 19:49    EKG: Sinus rhythm, 89 bpm  ASSESSMENT AND PLAN:  1. Chest pain, with typical and atypical features, in a 72 year old female with multiple cardiovascular risk factors, with negative initial troponin and ECG without evidence of ischemia. 2. Hypertension, on HCTZ, losartan,  3. Hyperlipidemia, on pravastatin 4. Spinal stenosis, with chronic lower back pain  Plan: 1. Second troponin results pending 2. If second troponin negative, will proceed with nuclear stress test this morning 3. Review 2D echocardiogram  Signed: Clabe Seal PA-C 07/11/2018, 8:13 AM

## 2018-07-29 ENCOUNTER — Other Ambulatory Visit: Payer: Self-pay

## 2018-07-30 ENCOUNTER — Ambulatory Visit
Admission: RE | Admit: 2018-07-30 | Discharge: 2018-07-30 | Disposition: A | Discharge status: Home / Self Care | Payer: Medicare Other | Source: Ambulatory Visit | Attending: Neurosurgery | Admitting: Neurosurgery

## 2018-07-30 DIAGNOSIS — M5134 Other intervertebral disc degeneration, thoracic region: Secondary | ICD-10-CM | POA: Diagnosis not present

## 2018-07-30 DIAGNOSIS — M4714 Other spondylosis with myelopathy, thoracic region: Secondary | ICD-10-CM | POA: Insufficient documentation

## 2018-07-30 DIAGNOSIS — M546 Pain in thoracic spine: Secondary | ICD-10-CM | POA: Diagnosis not present

## 2018-08-01 DIAGNOSIS — D225 Melanocytic nevi of trunk: Secondary | ICD-10-CM | POA: Diagnosis not present

## 2018-08-01 DIAGNOSIS — D2272 Melanocytic nevi of left lower limb, including hip: Secondary | ICD-10-CM | POA: Diagnosis not present

## 2018-08-01 DIAGNOSIS — L821 Other seborrheic keratosis: Secondary | ICD-10-CM | POA: Diagnosis not present

## 2018-08-01 DIAGNOSIS — L538 Other specified erythematous conditions: Secondary | ICD-10-CM | POA: Diagnosis not present

## 2018-08-01 DIAGNOSIS — D2261 Melanocytic nevi of right upper limb, including shoulder: Secondary | ICD-10-CM | POA: Diagnosis not present

## 2018-08-01 DIAGNOSIS — D2271 Melanocytic nevi of right lower limb, including hip: Secondary | ICD-10-CM | POA: Diagnosis not present

## 2018-08-01 DIAGNOSIS — D2262 Melanocytic nevi of left upper limb, including shoulder: Secondary | ICD-10-CM | POA: Diagnosis not present

## 2018-08-01 DIAGNOSIS — L298 Other pruritus: Secondary | ICD-10-CM | POA: Diagnosis not present

## 2018-08-01 DIAGNOSIS — L820 Inflamed seborrheic keratosis: Secondary | ICD-10-CM | POA: Diagnosis not present

## 2018-08-14 DIAGNOSIS — M4714 Other spondylosis with myelopathy, thoracic region: Secondary | ICD-10-CM | POA: Diagnosis not present

## 2018-08-30 DIAGNOSIS — Z23 Encounter for immunization: Secondary | ICD-10-CM | POA: Diagnosis not present

## 2018-08-30 DIAGNOSIS — I10 Essential (primary) hypertension: Secondary | ICD-10-CM | POA: Diagnosis not present

## 2018-08-30 DIAGNOSIS — G4733 Obstructive sleep apnea (adult) (pediatric): Secondary | ICD-10-CM | POA: Diagnosis not present

## 2018-08-30 DIAGNOSIS — R7302 Impaired glucose tolerance (oral): Secondary | ICD-10-CM | POA: Diagnosis not present

## 2018-08-30 DIAGNOSIS — E782 Mixed hyperlipidemia: Secondary | ICD-10-CM | POA: Diagnosis not present

## 2018-08-30 DIAGNOSIS — E668 Other obesity: Secondary | ICD-10-CM | POA: Diagnosis not present

## 2018-09-17 DIAGNOSIS — M5416 Radiculopathy, lumbar region: Secondary | ICD-10-CM | POA: Diagnosis not present

## 2018-10-21 DIAGNOSIS — I8390 Asymptomatic varicose veins of unspecified lower extremity: Secondary | ICD-10-CM | POA: Insufficient documentation

## 2018-10-21 DIAGNOSIS — I83813 Varicose veins of bilateral lower extremities with pain: Secondary | ICD-10-CM | POA: Diagnosis not present

## 2018-10-21 DIAGNOSIS — M5416 Radiculopathy, lumbar region: Secondary | ICD-10-CM | POA: Diagnosis not present

## 2018-11-04 DIAGNOSIS — Z6841 Body Mass Index (BMI) 40.0 and over, adult: Secondary | ICD-10-CM | POA: Diagnosis not present

## 2018-11-04 DIAGNOSIS — Z1331 Encounter for screening for depression: Secondary | ICD-10-CM | POA: Diagnosis not present

## 2018-11-04 DIAGNOSIS — M5441 Lumbago with sciatica, right side: Secondary | ICD-10-CM | POA: Diagnosis not present

## 2018-11-04 DIAGNOSIS — F329 Major depressive disorder, single episode, unspecified: Secondary | ICD-10-CM | POA: Diagnosis not present

## 2018-11-04 DIAGNOSIS — F411 Generalized anxiety disorder: Secondary | ICD-10-CM | POA: Diagnosis not present

## 2018-11-04 DIAGNOSIS — I10 Essential (primary) hypertension: Secondary | ICD-10-CM | POA: Insufficient documentation

## 2018-11-04 DIAGNOSIS — E119 Type 2 diabetes mellitus without complications: Secondary | ICD-10-CM | POA: Diagnosis not present

## 2018-11-04 DIAGNOSIS — E6609 Other obesity due to excess calories: Secondary | ICD-10-CM | POA: Diagnosis not present

## 2018-11-04 DIAGNOSIS — G4762 Sleep related leg cramps: Secondary | ICD-10-CM | POA: Diagnosis not present

## 2018-11-04 DIAGNOSIS — R252 Cramp and spasm: Secondary | ICD-10-CM | POA: Diagnosis not present

## 2018-11-05 DIAGNOSIS — M5416 Radiculopathy, lumbar region: Secondary | ICD-10-CM | POA: Insufficient documentation

## 2018-11-05 DIAGNOSIS — F322 Major depressive disorder, single episode, severe without psychotic features: Secondary | ICD-10-CM | POA: Insufficient documentation

## 2018-11-12 DIAGNOSIS — M5416 Radiculopathy, lumbar region: Secondary | ICD-10-CM | POA: Diagnosis not present

## 2018-11-12 DIAGNOSIS — I10 Essential (primary) hypertension: Secondary | ICD-10-CM | POA: Diagnosis not present

## 2018-11-12 DIAGNOSIS — Z6841 Body Mass Index (BMI) 40.0 and over, adult: Secondary | ICD-10-CM | POA: Diagnosis not present

## 2018-12-05 DIAGNOSIS — F329 Major depressive disorder, single episode, unspecified: Secondary | ICD-10-CM | POA: Diagnosis not present

## 2018-12-05 DIAGNOSIS — I10 Essential (primary) hypertension: Secondary | ICD-10-CM | POA: Diagnosis not present

## 2018-12-05 DIAGNOSIS — Z1331 Encounter for screening for depression: Secondary | ICD-10-CM | POA: Diagnosis not present

## 2018-12-05 DIAGNOSIS — E782 Mixed hyperlipidemia: Secondary | ICD-10-CM | POA: Diagnosis not present

## 2018-12-05 DIAGNOSIS — R5383 Other fatigue: Secondary | ICD-10-CM | POA: Diagnosis not present

## 2018-12-05 DIAGNOSIS — E119 Type 2 diabetes mellitus without complications: Secondary | ICD-10-CM | POA: Diagnosis not present

## 2018-12-05 DIAGNOSIS — G4733 Obstructive sleep apnea (adult) (pediatric): Secondary | ICD-10-CM | POA: Diagnosis not present

## 2018-12-05 DIAGNOSIS — D510 Vitamin B12 deficiency anemia due to intrinsic factor deficiency: Secondary | ICD-10-CM | POA: Diagnosis not present

## 2018-12-30 IMAGING — CT CT CHEST W/O CM
2 of 4 series · 15 of 36 positions shown, 18 images · non-contrast
Comparison: 01/08/2015 chest CT angiogram. Outside CT abdomen dated
01/25/2018.

CLINICAL DATA: Follow-up pulmonary nodule.  Adrenal mass.

EXAM:
CT CHEST WITHOUT CONTRAST
TECHNIQUE: Multidetector CT imaging of the chest was performed following the
standard protocol without IV contrast.

[Series 2: chest 2.00 br40 s3 · axial · 0.67mm/px · z∈[-1278,-988]mm · 12 of 173 slices shown, 15 images]
[im 14/173  mediastinal]
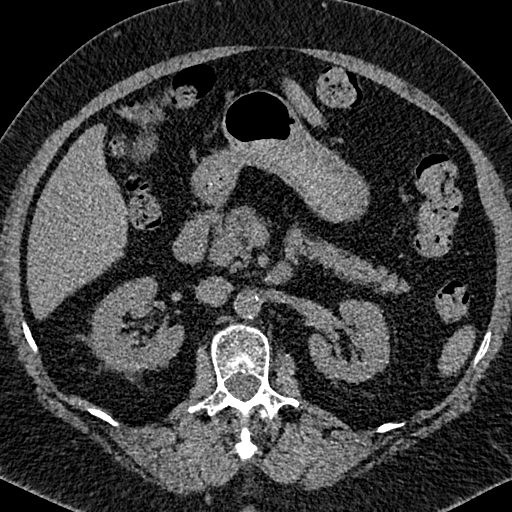
[im 14/173  lung]
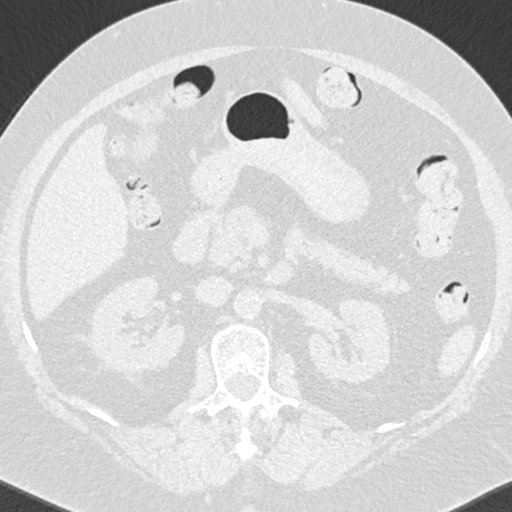
[im 27/173  lung]
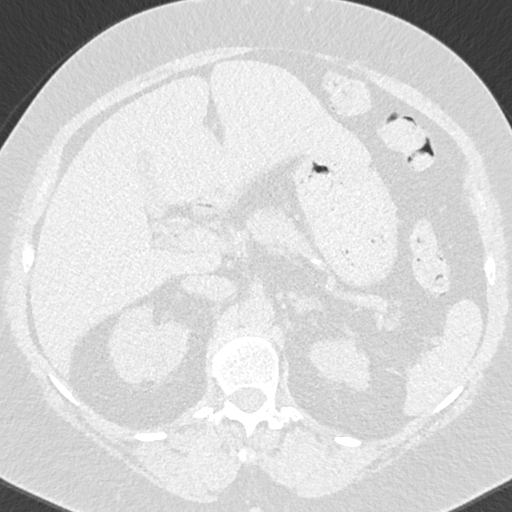
[im 40/173  lung]
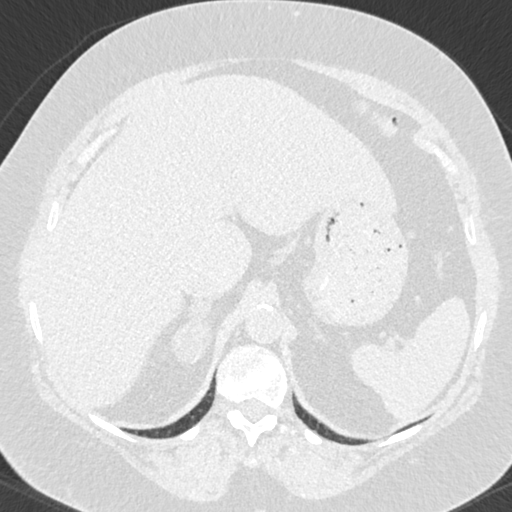
[im 53/173  lung]
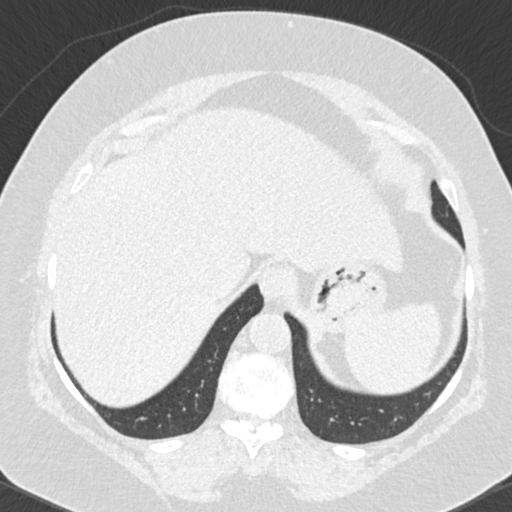
[im 67/173  mediastinal]
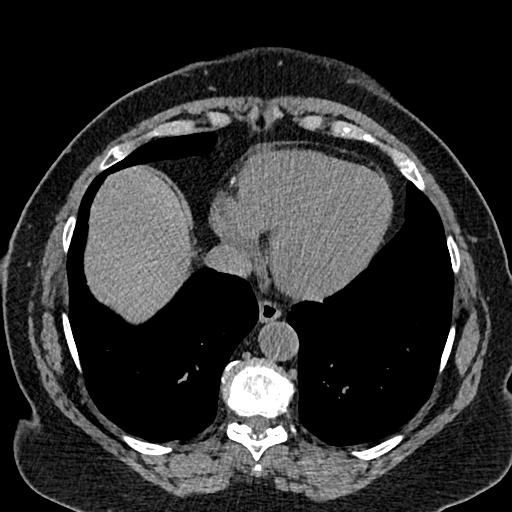
[im 67/173  lung]
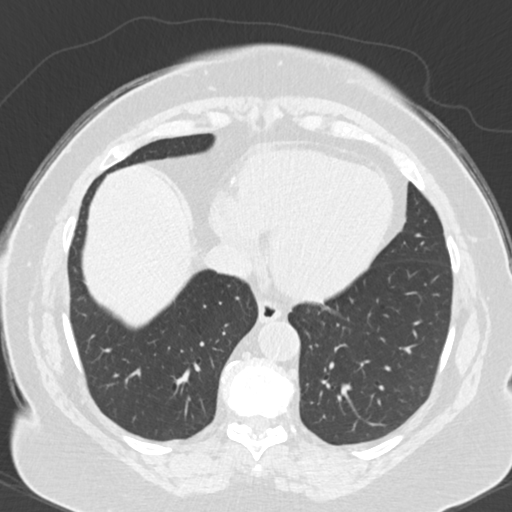
[im 80/173  lung]
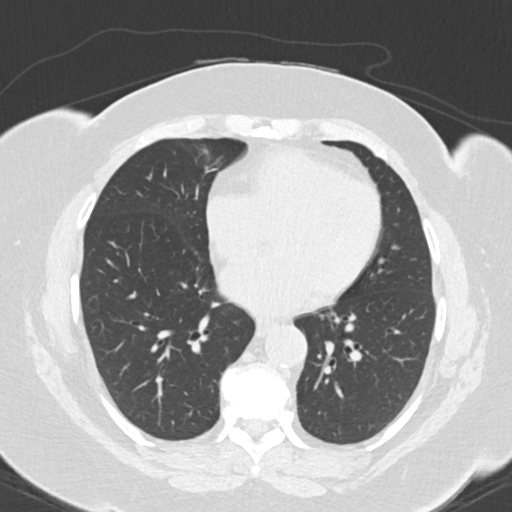
[im 93/173  lung]
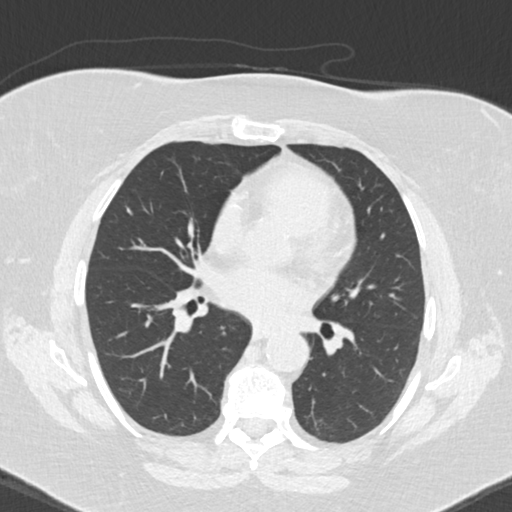
[im 106/173  lung]
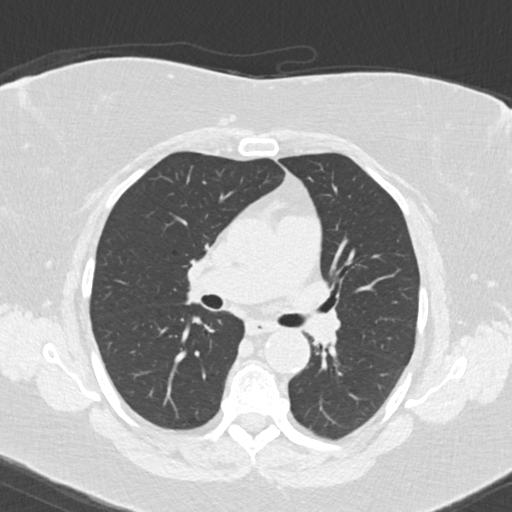
[im 120/173  mediastinal]
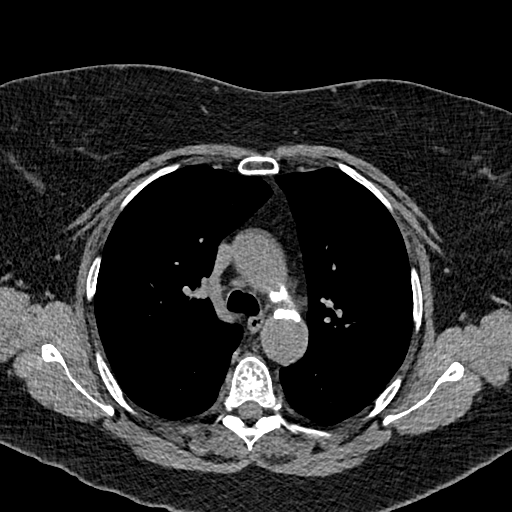
[im 120/173  lung]
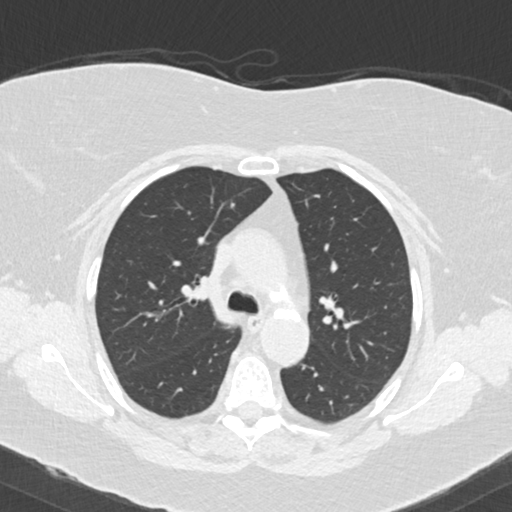
[im 133/173  lung]
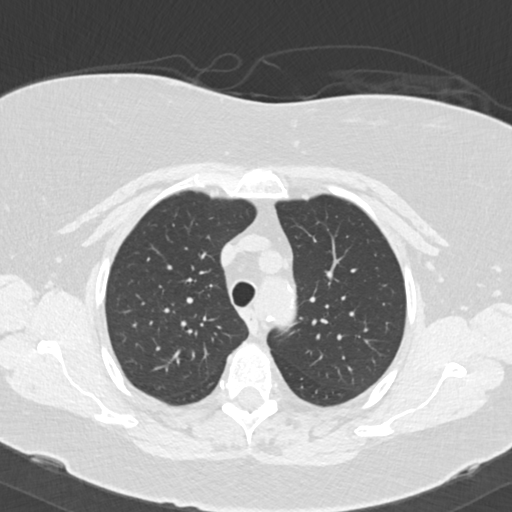
[im 146/173  lung]
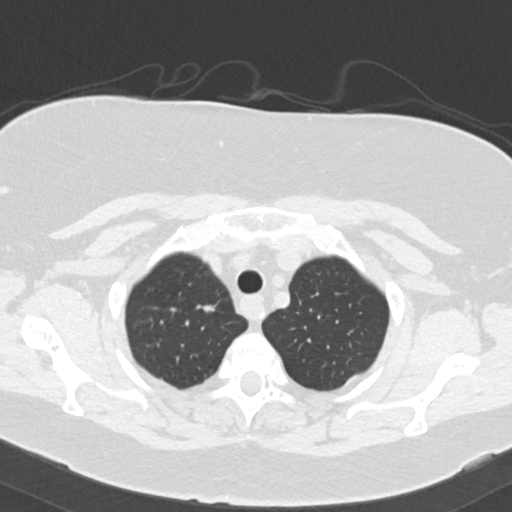
[im 159/173  lung]
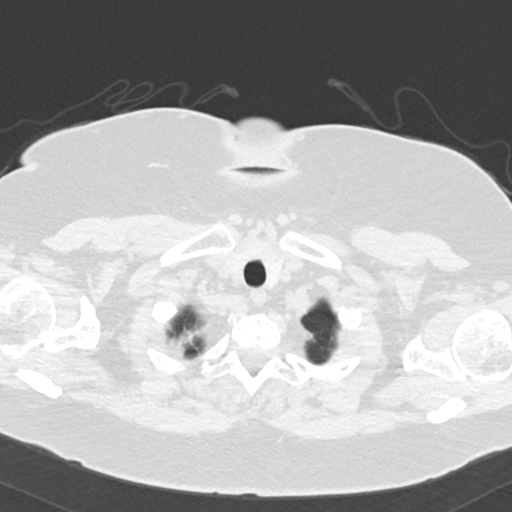

[Series 5: chest 2.00 br40 s3 cor · coronal · 0.67mm/px · 3 of 153 slices shown]
[im 31/153  lung]
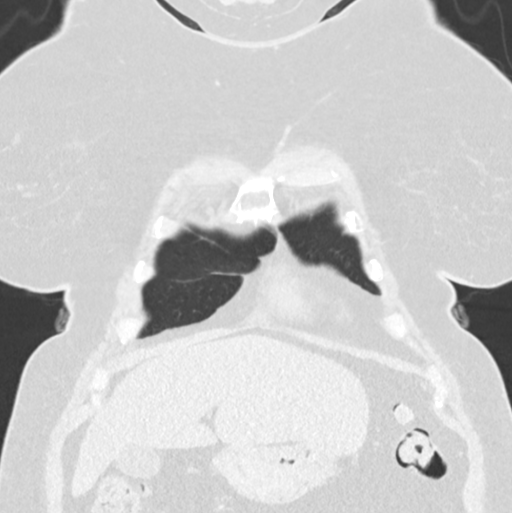
[im 61/153  lung]
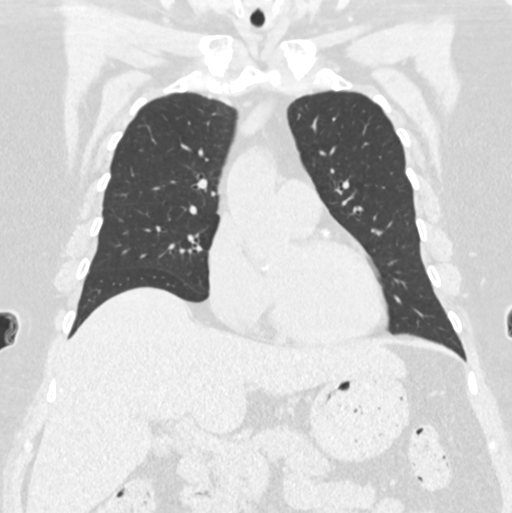
[im 92/153  lung]
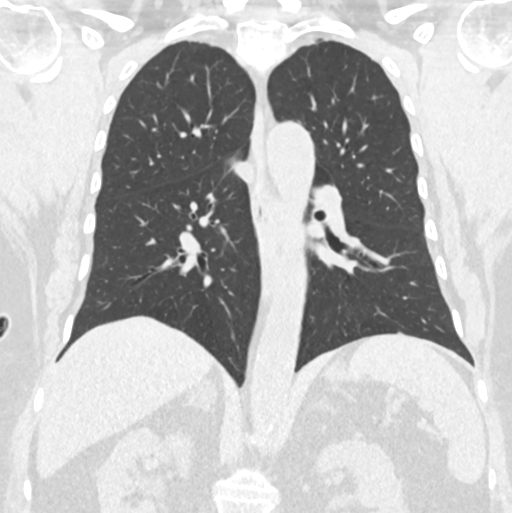

[15 of 36 positions shown; findings below may reference images not displayed]

FINDINGS: Cardiovascular: Normal heart size. No significant pericardial
fluid/thickening. Three-vessel coronary atherosclerosis.
Atherosclerotic nonaneurysmal thoracic aorta. Normal caliber main
pulmonary artery. Aberrant ectatic (18 mm diameter proximally) right
subclavian artery arising from the distal aortic arch with
retroesophageal course.

Mediastinum/Nodes: No discrete thyroid nodules. Unremarkable
esophagus. No pathologically enlarged axillary, mediastinal or gross
hilar lymph nodes, noting limited sensitivity for the detection of
hilar adenopathy on this noncontrast study.

Lungs/Pleura: No pneumothorax. No pleural effusion. No acute
consolidative airspace disease or lung masses. Apical right upper
lobe irregular 7 mm solid pulmonary nodule (series 3/image 28) is
stable since 01/08/2015. Tiny calcified 3 mm peripheral right upper
lobe granuloma is stable. Solid medial basilar left lower lobe 3 mm
solid pulmonary nodule (series 3/image 119) is stable. No new
significant pulmonary nodules. Stable small parenchymal band in the
medial segment right middle lobe compatible with minimal
postinfectious/postinflammatory scarring.

Upper abdomen: Right adrenal 3.2 cm mass with density 13 HU is
stable in size since 01/08/2015, compatible with a benign adenoma.

Musculoskeletal: No aggressive appearing focal osseous lesions. Mild
thoracic spondylosis.
IMPRESSION: 1. Pulmonary nodules are stable since 3649, considered benign. No
acute cardiopulmonary disease.
2. Stable right adrenal adenoma.
3. Three-vessel coronary atherosclerosis.
4. Aberrant ectatic right subclavian artery.

Aortic Atherosclerosis ([id number]).

## 2019-01-07 DIAGNOSIS — F329 Major depressive disorder, single episode, unspecified: Secondary | ICD-10-CM | POA: Diagnosis not present

## 2019-01-07 DIAGNOSIS — E782 Mixed hyperlipidemia: Secondary | ICD-10-CM | POA: Diagnosis not present

## 2019-01-07 DIAGNOSIS — I10 Essential (primary) hypertension: Secondary | ICD-10-CM | POA: Diagnosis not present

## 2019-01-07 DIAGNOSIS — R5383 Other fatigue: Secondary | ICD-10-CM | POA: Diagnosis not present

## 2019-01-07 DIAGNOSIS — G4733 Obstructive sleep apnea (adult) (pediatric): Secondary | ICD-10-CM | POA: Diagnosis not present

## 2019-01-07 DIAGNOSIS — E119 Type 2 diabetes mellitus without complications: Secondary | ICD-10-CM | POA: Diagnosis not present

## 2019-01-07 DIAGNOSIS — D510 Vitamin B12 deficiency anemia due to intrinsic factor deficiency: Secondary | ICD-10-CM | POA: Diagnosis not present

## 2019-01-09 DIAGNOSIS — F411 Generalized anxiety disorder: Secondary | ICD-10-CM | POA: Diagnosis not present

## 2019-01-09 DIAGNOSIS — E782 Mixed hyperlipidemia: Secondary | ICD-10-CM | POA: Diagnosis not present

## 2019-01-09 DIAGNOSIS — E6609 Other obesity due to excess calories: Secondary | ICD-10-CM | POA: Diagnosis not present

## 2019-01-09 DIAGNOSIS — I10 Essential (primary) hypertension: Secondary | ICD-10-CM | POA: Diagnosis not present

## 2019-01-09 DIAGNOSIS — Z6841 Body Mass Index (BMI) 40.0 and over, adult: Secondary | ICD-10-CM | POA: Diagnosis not present

## 2019-01-09 DIAGNOSIS — Z1331 Encounter for screening for depression: Secondary | ICD-10-CM | POA: Diagnosis not present

## 2019-01-09 DIAGNOSIS — F329 Major depressive disorder, single episode, unspecified: Secondary | ICD-10-CM | POA: Diagnosis not present

## 2019-01-09 DIAGNOSIS — E119 Type 2 diabetes mellitus without complications: Secondary | ICD-10-CM | POA: Diagnosis not present

## 2019-01-20 DIAGNOSIS — M5416 Radiculopathy, lumbar region: Secondary | ICD-10-CM | POA: Diagnosis not present

## 2019-01-21 ENCOUNTER — Other Ambulatory Visit: Payer: Self-pay | Admitting: Internal Medicine

## 2019-01-21 DIAGNOSIS — Z1231 Encounter for screening mammogram for malignant neoplasm of breast: Secondary | ICD-10-CM

## 2019-02-27 ENCOUNTER — Ambulatory Visit
Admission: RE | Admit: 2019-02-27 | Discharge: 2019-02-27 | Disposition: A | Discharge status: Home / Self Care | Payer: Medicare Other | Source: Ambulatory Visit | Attending: Internal Medicine | Admitting: Internal Medicine

## 2019-02-27 ENCOUNTER — Other Ambulatory Visit: Payer: Self-pay

## 2019-02-27 DIAGNOSIS — Z1231 Encounter for screening mammogram for malignant neoplasm of breast: Secondary | ICD-10-CM | POA: Diagnosis not present

## 2019-05-06 DIAGNOSIS — S82034A Nondisplaced transverse fracture of right patella, initial encounter for closed fracture: Secondary | ICD-10-CM | POA: Diagnosis not present

## 2019-05-06 DIAGNOSIS — W010XXA Fall on same level from slipping, tripping and stumbling without subsequent striking against object, initial encounter: Secondary | ICD-10-CM | POA: Diagnosis not present

## 2019-05-06 DIAGNOSIS — M25562 Pain in left knee: Secondary | ICD-10-CM | POA: Diagnosis not present

## 2019-05-06 DIAGNOSIS — M25561 Pain in right knee: Secondary | ICD-10-CM | POA: Diagnosis not present

## 2019-05-13 DIAGNOSIS — M48061 Spinal stenosis, lumbar region without neurogenic claudication: Secondary | ICD-10-CM | POA: Diagnosis not present

## 2019-05-13 DIAGNOSIS — M5416 Radiculopathy, lumbar region: Secondary | ICD-10-CM | POA: Diagnosis not present

## 2019-06-04 DIAGNOSIS — L720 Epidermal cyst: Secondary | ICD-10-CM | POA: Diagnosis not present

## 2019-06-04 DIAGNOSIS — D1800 Hemangioma unspecified site: Secondary | ICD-10-CM | POA: Diagnosis not present

## 2019-06-04 DIAGNOSIS — L821 Other seborrheic keratosis: Secondary | ICD-10-CM | POA: Diagnosis not present

## 2019-06-04 DIAGNOSIS — L918 Other hypertrophic disorders of the skin: Secondary | ICD-10-CM | POA: Diagnosis not present

## 2019-06-04 DIAGNOSIS — L820 Inflamed seborrheic keratosis: Secondary | ICD-10-CM | POA: Diagnosis not present

## 2019-06-04 DIAGNOSIS — L814 Other melanin hyperpigmentation: Secondary | ICD-10-CM | POA: Diagnosis not present

## 2019-06-04 DIAGNOSIS — D225 Melanocytic nevi of trunk: Secondary | ICD-10-CM | POA: Diagnosis not present

## 2019-06-04 DIAGNOSIS — Z1283 Encounter for screening for malignant neoplasm of skin: Secondary | ICD-10-CM | POA: Diagnosis not present

## 2019-06-10 DIAGNOSIS — M48061 Spinal stenosis, lumbar region without neurogenic claudication: Secondary | ICD-10-CM | POA: Diagnosis not present

## 2019-06-16 DIAGNOSIS — I10 Essential (primary) hypertension: Secondary | ICD-10-CM | POA: Diagnosis not present

## 2019-06-16 DIAGNOSIS — D510 Vitamin B12 deficiency anemia due to intrinsic factor deficiency: Secondary | ICD-10-CM | POA: Diagnosis not present

## 2019-06-16 DIAGNOSIS — F329 Major depressive disorder, single episode, unspecified: Secondary | ICD-10-CM | POA: Diagnosis not present

## 2019-06-16 DIAGNOSIS — M5135 Other intervertebral disc degeneration, thoracolumbar region: Secondary | ICD-10-CM | POA: Diagnosis not present

## 2019-06-16 DIAGNOSIS — E782 Mixed hyperlipidemia: Secondary | ICD-10-CM | POA: Diagnosis not present

## 2019-06-16 DIAGNOSIS — E6609 Other obesity due to excess calories: Secondary | ICD-10-CM | POA: Diagnosis not present

## 2019-06-16 DIAGNOSIS — M5489 Other dorsalgia: Secondary | ICD-10-CM | POA: Diagnosis not present

## 2019-06-16 DIAGNOSIS — E1165 Type 2 diabetes mellitus with hyperglycemia: Secondary | ICD-10-CM | POA: Diagnosis not present

## 2019-06-18 DIAGNOSIS — E782 Mixed hyperlipidemia: Secondary | ICD-10-CM | POA: Diagnosis not present

## 2019-06-18 DIAGNOSIS — E1165 Type 2 diabetes mellitus with hyperglycemia: Secondary | ICD-10-CM | POA: Diagnosis not present

## 2019-06-18 DIAGNOSIS — D510 Vitamin B12 deficiency anemia due to intrinsic factor deficiency: Secondary | ICD-10-CM | POA: Diagnosis not present

## 2019-06-18 DIAGNOSIS — I10 Essential (primary) hypertension: Secondary | ICD-10-CM | POA: Diagnosis not present

## 2019-06-20 DIAGNOSIS — I10 Essential (primary) hypertension: Secondary | ICD-10-CM | POA: Diagnosis not present

## 2019-06-20 DIAGNOSIS — E782 Mixed hyperlipidemia: Secondary | ICD-10-CM | POA: Diagnosis not present

## 2019-06-20 DIAGNOSIS — E1169 Type 2 diabetes mellitus with other specified complication: Secondary | ICD-10-CM | POA: Diagnosis not present

## 2019-06-26 DIAGNOSIS — M545 Low back pain: Secondary | ICD-10-CM | POA: Diagnosis not present

## 2019-07-08 DIAGNOSIS — M48061 Spinal stenosis, lumbar region without neurogenic claudication: Secondary | ICD-10-CM | POA: Diagnosis not present

## 2019-07-08 DIAGNOSIS — M5416 Radiculopathy, lumbar region: Secondary | ICD-10-CM | POA: Diagnosis not present

## 2019-07-08 DIAGNOSIS — I10 Essential (primary) hypertension: Secondary | ICD-10-CM | POA: Diagnosis not present

## 2019-07-08 DIAGNOSIS — Z6841 Body Mass Index (BMI) 40.0 and over, adult: Secondary | ICD-10-CM | POA: Diagnosis not present

## 2019-07-28 DIAGNOSIS — L820 Inflamed seborrheic keratosis: Secondary | ICD-10-CM | POA: Diagnosis not present

## 2019-07-28 DIAGNOSIS — L821 Other seborrheic keratosis: Secondary | ICD-10-CM | POA: Diagnosis not present

## 2019-07-28 DIAGNOSIS — L720 Epidermal cyst: Secondary | ICD-10-CM | POA: Diagnosis not present

## 2019-07-28 DIAGNOSIS — L918 Other hypertrophic disorders of the skin: Secondary | ICD-10-CM | POA: Diagnosis not present

## 2019-07-28 DIAGNOSIS — D1800 Hemangioma unspecified site: Secondary | ICD-10-CM | POA: Diagnosis not present

## 2019-07-28 DIAGNOSIS — L304 Erythema intertrigo: Secondary | ICD-10-CM | POA: Diagnosis not present

## 2019-07-29 DIAGNOSIS — M545 Low back pain: Secondary | ICD-10-CM | POA: Diagnosis not present

## 2019-08-26 DIAGNOSIS — M545 Low back pain: Secondary | ICD-10-CM | POA: Diagnosis not present

## 2019-09-11 DIAGNOSIS — M5416 Radiculopathy, lumbar region: Secondary | ICD-10-CM | POA: Diagnosis not present

## 2019-09-22 DIAGNOSIS — L304 Erythema intertrigo: Secondary | ICD-10-CM | POA: Diagnosis not present

## 2019-09-22 DIAGNOSIS — D1800 Hemangioma unspecified site: Secondary | ICD-10-CM | POA: Diagnosis not present

## 2019-09-22 DIAGNOSIS — L820 Inflamed seborrheic keratosis: Secondary | ICD-10-CM | POA: Diagnosis not present

## 2019-09-22 DIAGNOSIS — D2239 Melanocytic nevi of other parts of face: Secondary | ICD-10-CM | POA: Diagnosis not present

## 2019-09-25 DIAGNOSIS — M545 Low back pain: Secondary | ICD-10-CM | POA: Diagnosis not present

## 2019-09-29 DIAGNOSIS — F411 Generalized anxiety disorder: Secondary | ICD-10-CM | POA: Diagnosis not present

## 2019-09-29 DIAGNOSIS — E1169 Type 2 diabetes mellitus with other specified complication: Secondary | ICD-10-CM | POA: Diagnosis not present

## 2019-09-29 DIAGNOSIS — Z23 Encounter for immunization: Secondary | ICD-10-CM | POA: Diagnosis not present

## 2019-09-29 DIAGNOSIS — D510 Vitamin B12 deficiency anemia due to intrinsic factor deficiency: Secondary | ICD-10-CM | POA: Diagnosis not present

## 2019-09-29 DIAGNOSIS — R5383 Other fatigue: Secondary | ICD-10-CM | POA: Diagnosis not present

## 2019-09-29 DIAGNOSIS — F329 Major depressive disorder, single episode, unspecified: Secondary | ICD-10-CM | POA: Diagnosis not present

## 2019-09-29 DIAGNOSIS — E782 Mixed hyperlipidemia: Secondary | ICD-10-CM | POA: Diagnosis not present

## 2019-09-29 DIAGNOSIS — I10 Essential (primary) hypertension: Secondary | ICD-10-CM | POA: Diagnosis not present

## 2019-10-15 DIAGNOSIS — M48061 Spinal stenosis, lumbar region without neurogenic claudication: Secondary | ICD-10-CM | POA: Diagnosis not present

## 2019-10-15 DIAGNOSIS — M5416 Radiculopathy, lumbar region: Secondary | ICD-10-CM | POA: Diagnosis not present

## 2019-10-17 DIAGNOSIS — E875 Hyperkalemia: Secondary | ICD-10-CM | POA: Diagnosis not present

## 2019-10-17 DIAGNOSIS — E785 Hyperlipidemia, unspecified: Secondary | ICD-10-CM | POA: Diagnosis not present

## 2019-10-17 DIAGNOSIS — R944 Abnormal results of kidney function studies: Secondary | ICD-10-CM | POA: Diagnosis not present

## 2019-12-15 DIAGNOSIS — M5416 Radiculopathy, lumbar region: Secondary | ICD-10-CM | POA: Diagnosis not present

## 2020-01-06 DIAGNOSIS — M47816 Spondylosis without myelopathy or radiculopathy, lumbar region: Secondary | ICD-10-CM | POA: Diagnosis not present

## 2020-01-06 DIAGNOSIS — M48061 Spinal stenosis, lumbar region without neurogenic claudication: Secondary | ICD-10-CM | POA: Diagnosis not present

## 2020-01-09 DIAGNOSIS — E119 Type 2 diabetes mellitus without complications: Secondary | ICD-10-CM | POA: Diagnosis not present

## 2020-01-09 DIAGNOSIS — E6609 Other obesity due to excess calories: Secondary | ICD-10-CM | POA: Diagnosis not present

## 2020-01-09 DIAGNOSIS — I10 Essential (primary) hypertension: Secondary | ICD-10-CM | POA: Diagnosis not present

## 2020-01-09 DIAGNOSIS — F411 Generalized anxiety disorder: Secondary | ICD-10-CM | POA: Diagnosis not present

## 2020-01-09 DIAGNOSIS — E782 Mixed hyperlipidemia: Secondary | ICD-10-CM | POA: Diagnosis not present

## 2020-01-13 DIAGNOSIS — E1169 Type 2 diabetes mellitus with other specified complication: Secondary | ICD-10-CM | POA: Diagnosis not present

## 2020-01-13 DIAGNOSIS — E559 Vitamin D deficiency, unspecified: Secondary | ICD-10-CM | POA: Diagnosis not present

## 2020-01-13 DIAGNOSIS — E782 Mixed hyperlipidemia: Secondary | ICD-10-CM | POA: Diagnosis not present

## 2020-01-13 DIAGNOSIS — I10 Essential (primary) hypertension: Secondary | ICD-10-CM | POA: Diagnosis not present

## 2020-01-13 DIAGNOSIS — E119 Type 2 diabetes mellitus without complications: Secondary | ICD-10-CM | POA: Diagnosis not present

## 2020-01-13 DIAGNOSIS — D519 Vitamin B12 deficiency anemia, unspecified: Secondary | ICD-10-CM | POA: Diagnosis not present

## 2020-01-13 DIAGNOSIS — R5383 Other fatigue: Secondary | ICD-10-CM | POA: Diagnosis not present

## 2020-01-30 ENCOUNTER — Other Ambulatory Visit: Payer: Self-pay | Admitting: Family

## 2020-01-30 DIAGNOSIS — Z1231 Encounter for screening mammogram for malignant neoplasm of breast: Secondary | ICD-10-CM

## 2020-02-24 DIAGNOSIS — F329 Major depressive disorder, single episode, unspecified: Secondary | ICD-10-CM | POA: Diagnosis not present

## 2020-02-24 DIAGNOSIS — E1165 Type 2 diabetes mellitus with hyperglycemia: Secondary | ICD-10-CM | POA: Diagnosis not present

## 2020-02-24 DIAGNOSIS — I10 Essential (primary) hypertension: Secondary | ICD-10-CM | POA: Diagnosis not present

## 2020-02-24 DIAGNOSIS — E782 Mixed hyperlipidemia: Secondary | ICD-10-CM | POA: Diagnosis not present

## 2020-03-01 ENCOUNTER — Encounter (INDEPENDENT_AMBULATORY_CARE_PROVIDER_SITE_OTHER): Payer: Self-pay

## 2020-03-01 ENCOUNTER — Ambulatory Visit
Admission: RE | Admit: 2020-03-01 | Discharge: 2020-03-01 | Disposition: A | Discharge status: Home / Self Care | Payer: Medicare Other | Source: Ambulatory Visit | Attending: Family | Admitting: Family

## 2020-03-01 ENCOUNTER — Other Ambulatory Visit: Payer: Self-pay

## 2020-03-01 DIAGNOSIS — Z1231 Encounter for screening mammogram for malignant neoplasm of breast: Secondary | ICD-10-CM | POA: Diagnosis not present

## 2020-03-17 DIAGNOSIS — D510 Vitamin B12 deficiency anemia due to intrinsic factor deficiency: Secondary | ICD-10-CM | POA: Diagnosis not present

## 2020-03-17 DIAGNOSIS — R252 Cramp and spasm: Secondary | ICD-10-CM | POA: Diagnosis not present

## 2020-03-30 DIAGNOSIS — R1084 Generalized abdominal pain: Secondary | ICD-10-CM | POA: Diagnosis not present

## 2020-03-30 DIAGNOSIS — E1142 Type 2 diabetes mellitus with diabetic polyneuropathy: Secondary | ICD-10-CM | POA: Diagnosis not present

## 2020-03-30 DIAGNOSIS — E782 Mixed hyperlipidemia: Secondary | ICD-10-CM | POA: Diagnosis not present

## 2020-03-30 DIAGNOSIS — I10 Essential (primary) hypertension: Secondary | ICD-10-CM | POA: Diagnosis not present

## 2020-03-30 DIAGNOSIS — F329 Major depressive disorder, single episode, unspecified: Secondary | ICD-10-CM | POA: Diagnosis not present

## 2020-04-08 DIAGNOSIS — R1084 Generalized abdominal pain: Secondary | ICD-10-CM | POA: Diagnosis not present

## 2020-04-19 DIAGNOSIS — M47816 Spondylosis without myelopathy or radiculopathy, lumbar region: Secondary | ICD-10-CM | POA: Diagnosis not present

## 2020-04-28 DIAGNOSIS — K219 Gastro-esophageal reflux disease without esophagitis: Secondary | ICD-10-CM | POA: Diagnosis not present

## 2020-04-28 DIAGNOSIS — E782 Mixed hyperlipidemia: Secondary | ICD-10-CM | POA: Diagnosis not present

## 2020-04-28 DIAGNOSIS — E1169 Type 2 diabetes mellitus with other specified complication: Secondary | ICD-10-CM | POA: Diagnosis not present

## 2020-04-28 DIAGNOSIS — G629 Polyneuropathy, unspecified: Secondary | ICD-10-CM | POA: Diagnosis not present

## 2020-04-28 DIAGNOSIS — I10 Essential (primary) hypertension: Secondary | ICD-10-CM | POA: Diagnosis not present

## 2020-04-28 DIAGNOSIS — F411 Generalized anxiety disorder: Secondary | ICD-10-CM | POA: Diagnosis not present

## 2020-05-12 DIAGNOSIS — M47816 Spondylosis without myelopathy or radiculopathy, lumbar region: Secondary | ICD-10-CM | POA: Diagnosis not present

## 2020-05-13 DIAGNOSIS — F411 Generalized anxiety disorder: Secondary | ICD-10-CM | POA: Diagnosis not present

## 2020-05-13 DIAGNOSIS — I10 Essential (primary) hypertension: Secondary | ICD-10-CM | POA: Diagnosis not present

## 2020-05-13 DIAGNOSIS — R42 Dizziness and giddiness: Secondary | ICD-10-CM | POA: Diagnosis not present

## 2020-05-13 DIAGNOSIS — E1165 Type 2 diabetes mellitus with hyperglycemia: Secondary | ICD-10-CM | POA: Diagnosis not present

## 2020-05-13 DIAGNOSIS — R5383 Other fatigue: Secondary | ICD-10-CM | POA: Diagnosis not present

## 2020-05-13 DIAGNOSIS — E782 Mixed hyperlipidemia: Secondary | ICD-10-CM | POA: Diagnosis not present

## 2020-05-20 ENCOUNTER — Other Ambulatory Visit: Payer: Self-pay | Admitting: Family

## 2020-05-20 DIAGNOSIS — I10 Essential (primary) hypertension: Secondary | ICD-10-CM | POA: Diagnosis not present

## 2020-05-20 DIAGNOSIS — R42 Dizziness and giddiness: Secondary | ICD-10-CM

## 2020-05-20 DIAGNOSIS — E669 Obesity, unspecified: Secondary | ICD-10-CM | POA: Diagnosis not present

## 2020-05-20 DIAGNOSIS — E1142 Type 2 diabetes mellitus with diabetic polyneuropathy: Secondary | ICD-10-CM | POA: Diagnosis not present

## 2020-05-20 DIAGNOSIS — R110 Nausea: Secondary | ICD-10-CM | POA: Diagnosis not present

## 2020-05-20 DIAGNOSIS — E782 Mixed hyperlipidemia: Secondary | ICD-10-CM | POA: Diagnosis not present

## 2020-05-20 DIAGNOSIS — F411 Generalized anxiety disorder: Secondary | ICD-10-CM | POA: Diagnosis not present

## 2020-05-25 ENCOUNTER — Telehealth: Payer: Self-pay | Admitting: Family

## 2020-05-25 NOTE — Telephone Encounter (Signed)
05/25/20~Call x 2/Patient stated to c/b when CT Neck rcvd and hung up phone. Order place in WQ 10. MF

## 2020-05-26 ENCOUNTER — Other Ambulatory Visit: Payer: Self-pay | Admitting: Family

## 2020-05-26 DIAGNOSIS — R42 Dizziness and giddiness: Secondary | ICD-10-CM

## 2020-06-01 ENCOUNTER — Other Ambulatory Visit: Payer: Self-pay

## 2020-06-01 ENCOUNTER — Encounter (INDEPENDENT_AMBULATORY_CARE_PROVIDER_SITE_OTHER): Payer: Self-pay

## 2020-06-01 ENCOUNTER — Ambulatory Visit
Admission: RE | Admit: 2020-06-01 | Discharge: 2020-06-01 | Disposition: A | Discharge status: Home / Self Care | Payer: Medicare Other | Source: Ambulatory Visit | Attending: Family | Admitting: Family

## 2020-06-01 DIAGNOSIS — R42 Dizziness and giddiness: Secondary | ICD-10-CM | POA: Diagnosis not present

## 2020-06-01 DIAGNOSIS — M5033 Other cervical disc degeneration, cervicothoracic region: Secondary | ICD-10-CM | POA: Diagnosis not present

## 2020-06-01 DIAGNOSIS — R519 Headache, unspecified: Secondary | ICD-10-CM | POA: Diagnosis not present

## 2020-06-01 DIAGNOSIS — M47812 Spondylosis without myelopathy or radiculopathy, cervical region: Secondary | ICD-10-CM | POA: Diagnosis not present

## 2020-06-01 MED ORDER — IOHEXOL 300 MG/ML  SOLN
100.0000 mL | Freq: Once | INTRAMUSCULAR | Status: AC | PRN
  Administered 2020-06-01: 75 mL via INTRAVENOUS

## 2020-06-04 DIAGNOSIS — E1165 Type 2 diabetes mellitus with hyperglycemia: Secondary | ICD-10-CM | POA: Diagnosis not present

## 2020-06-04 DIAGNOSIS — G4733 Obstructive sleep apnea (adult) (pediatric): Secondary | ICD-10-CM | POA: Diagnosis not present

## 2020-06-04 DIAGNOSIS — F411 Generalized anxiety disorder: Secondary | ICD-10-CM | POA: Diagnosis not present

## 2020-06-04 DIAGNOSIS — I10 Essential (primary) hypertension: Secondary | ICD-10-CM | POA: Diagnosis not present

## 2020-06-04 DIAGNOSIS — K219 Gastro-esophageal reflux disease without esophagitis: Secondary | ICD-10-CM | POA: Diagnosis not present

## 2020-06-04 DIAGNOSIS — G43011 Migraine without aura, intractable, with status migrainosus: Secondary | ICD-10-CM | POA: Diagnosis not present

## 2020-06-04 DIAGNOSIS — M5135 Other intervertebral disc degeneration, thoracolumbar region: Secondary | ICD-10-CM | POA: Diagnosis not present

## 2020-06-04 DIAGNOSIS — M25561 Pain in right knee: Secondary | ICD-10-CM | POA: Diagnosis not present

## 2020-06-07 DIAGNOSIS — I10 Essential (primary) hypertension: Secondary | ICD-10-CM | POA: Diagnosis not present

## 2020-06-07 DIAGNOSIS — K219 Gastro-esophageal reflux disease without esophagitis: Secondary | ICD-10-CM | POA: Diagnosis not present

## 2020-06-07 DIAGNOSIS — F411 Generalized anxiety disorder: Secondary | ICD-10-CM | POA: Diagnosis not present

## 2020-06-07 DIAGNOSIS — E1165 Type 2 diabetes mellitus with hyperglycemia: Secondary | ICD-10-CM | POA: Diagnosis not present

## 2020-06-07 DIAGNOSIS — G4733 Obstructive sleep apnea (adult) (pediatric): Secondary | ICD-10-CM | POA: Diagnosis not present

## 2020-06-18 DIAGNOSIS — I70213 Atherosclerosis of native arteries of extremities with intermittent claudication, bilateral legs: Secondary | ICD-10-CM | POA: Diagnosis not present

## 2020-06-18 DIAGNOSIS — R42 Dizziness and giddiness: Secondary | ICD-10-CM | POA: Diagnosis not present

## 2020-06-29 DIAGNOSIS — I6523 Occlusion and stenosis of bilateral carotid arteries: Secondary | ICD-10-CM | POA: Diagnosis not present

## 2020-06-29 DIAGNOSIS — I10 Essential (primary) hypertension: Secondary | ICD-10-CM | POA: Diagnosis not present

## 2020-06-29 DIAGNOSIS — I739 Peripheral vascular disease, unspecified: Secondary | ICD-10-CM | POA: Diagnosis not present

## 2020-06-29 DIAGNOSIS — E118 Type 2 diabetes mellitus with unspecified complications: Secondary | ICD-10-CM | POA: Diagnosis not present

## 2020-06-29 DIAGNOSIS — E782 Mixed hyperlipidemia: Secondary | ICD-10-CM | POA: Diagnosis not present

## 2020-06-30 DIAGNOSIS — I10 Essential (primary) hypertension: Secondary | ICD-10-CM | POA: Diagnosis not present

## 2020-06-30 DIAGNOSIS — E1165 Type 2 diabetes mellitus with hyperglycemia: Secondary | ICD-10-CM | POA: Diagnosis not present

## 2020-06-30 DIAGNOSIS — R0602 Shortness of breath: Secondary | ICD-10-CM | POA: Diagnosis not present

## 2020-06-30 DIAGNOSIS — E782 Mixed hyperlipidemia: Secondary | ICD-10-CM | POA: Diagnosis not present

## 2020-06-30 DIAGNOSIS — R0600 Dyspnea, unspecified: Secondary | ICD-10-CM | POA: Diagnosis not present

## 2020-06-30 DIAGNOSIS — F329 Major depressive disorder, single episode, unspecified: Secondary | ICD-10-CM | POA: Diagnosis not present

## 2020-06-30 DIAGNOSIS — Z7984 Long term (current) use of oral hypoglycemic drugs: Secondary | ICD-10-CM | POA: Diagnosis not present

## 2020-06-30 DIAGNOSIS — E785 Hyperlipidemia, unspecified: Secondary | ICD-10-CM | POA: Diagnosis not present

## 2020-06-30 DIAGNOSIS — G629 Polyneuropathy, unspecified: Secondary | ICD-10-CM | POA: Diagnosis not present

## 2020-06-30 DIAGNOSIS — R42 Dizziness and giddiness: Secondary | ICD-10-CM | POA: Diagnosis not present

## 2020-06-30 DIAGNOSIS — F419 Anxiety disorder, unspecified: Secondary | ICD-10-CM | POA: Diagnosis not present

## 2020-06-30 DIAGNOSIS — H538 Other visual disturbances: Secondary | ICD-10-CM | POA: Diagnosis not present

## 2020-06-30 DIAGNOSIS — R110 Nausea: Secondary | ICD-10-CM | POA: Diagnosis not present

## 2020-07-01 ENCOUNTER — Other Ambulatory Visit: Payer: Self-pay | Admitting: Family

## 2020-07-01 DIAGNOSIS — I739 Peripheral vascular disease, unspecified: Secondary | ICD-10-CM

## 2020-07-01 DIAGNOSIS — I6529 Occlusion and stenosis of unspecified carotid artery: Secondary | ICD-10-CM

## 2020-07-05 ENCOUNTER — Ambulatory Visit
Admission: RE | Admit: 2020-07-05 | Discharge: 2020-07-05 | Disposition: A | Discharge status: Home / Self Care | Payer: Medicare Other | Source: Ambulatory Visit | Attending: Family | Admitting: Family

## 2020-07-05 ENCOUNTER — Other Ambulatory Visit: Payer: Self-pay

## 2020-07-05 DIAGNOSIS — I63233 Cerebral infarction due to unspecified occlusion or stenosis of bilateral carotid arteries: Secondary | ICD-10-CM | POA: Diagnosis not present

## 2020-07-05 DIAGNOSIS — I672 Cerebral atherosclerosis: Secondary | ICD-10-CM | POA: Diagnosis not present

## 2020-07-05 DIAGNOSIS — E278 Other specified disorders of adrenal gland: Secondary | ICD-10-CM | POA: Diagnosis not present

## 2020-07-05 DIAGNOSIS — I739 Peripheral vascular disease, unspecified: Secondary | ICD-10-CM | POA: Insufficient documentation

## 2020-07-05 DIAGNOSIS — I708 Atherosclerosis of other arteries: Secondary | ICD-10-CM | POA: Diagnosis not present

## 2020-07-05 DIAGNOSIS — I83891 Varicose veins of right lower extremities with other complications: Secondary | ICD-10-CM | POA: Diagnosis not present

## 2020-07-05 DIAGNOSIS — I6503 Occlusion and stenosis of bilateral vertebral arteries: Secondary | ICD-10-CM | POA: Diagnosis not present

## 2020-07-05 DIAGNOSIS — I700 Atherosclerosis of aorta: Secondary | ICD-10-CM | POA: Diagnosis not present

## 2020-07-05 DIAGNOSIS — I6529 Occlusion and stenosis of unspecified carotid artery: Secondary | ICD-10-CM | POA: Insufficient documentation

## 2020-07-05 MED ORDER — IOHEXOL 350 MG/ML SOLN
200.0000 mL | Freq: Once | INTRAVENOUS | Status: AC | PRN
  Administered 2020-07-05: 200 mL via INTRAVENOUS

## 2020-07-13 ENCOUNTER — Ambulatory Visit: Payer: Medicare Other | Admitting: Dermatology

## 2020-07-14 DIAGNOSIS — E782 Mixed hyperlipidemia: Secondary | ICD-10-CM | POA: Diagnosis not present

## 2020-07-14 DIAGNOSIS — G63 Polyneuropathy in diseases classified elsewhere: Secondary | ICD-10-CM | POA: Diagnosis not present

## 2020-07-14 DIAGNOSIS — I83819 Varicose veins of unspecified lower extremities with pain: Secondary | ICD-10-CM | POA: Diagnosis not present

## 2020-07-14 DIAGNOSIS — E1142 Type 2 diabetes mellitus with diabetic polyneuropathy: Secondary | ICD-10-CM | POA: Diagnosis not present

## 2020-07-14 DIAGNOSIS — I10 Essential (primary) hypertension: Secondary | ICD-10-CM | POA: Diagnosis not present

## 2020-07-14 DIAGNOSIS — I7091 Generalized atherosclerosis: Secondary | ICD-10-CM | POA: Diagnosis not present

## 2020-07-14 DIAGNOSIS — F411 Generalized anxiety disorder: Secondary | ICD-10-CM | POA: Diagnosis not present

## 2020-07-22 DIAGNOSIS — Z96651 Presence of right artificial knee joint: Secondary | ICD-10-CM | POA: Diagnosis not present

## 2020-07-29 DIAGNOSIS — Z23 Encounter for immunization: Secondary | ICD-10-CM | POA: Diagnosis not present

## 2020-08-02 ENCOUNTER — Telehealth (INDEPENDENT_AMBULATORY_CARE_PROVIDER_SITE_OTHER): Payer: Self-pay | Admitting: Nurse Practitioner

## 2020-08-02 NOTE — Telephone Encounter (Signed)
Called patient 07-30-20 to inform her that provider will be out of office at 3pm and I would need to r/s her. Patient voiced how upset she was about siing a NP and ONLY wants to see JD. I informed her that I can make the appt with JD but there would be a longer wait and gave her the option to keep appt and see NP or r/s and see JD. Patient starts to yell and tell me that is not what she wants. Patient hangs up the phone when I try to talk to her about her appt. 08-02-20 I am canceling her appt for 08-11-20 with NP and will add her to the wait list. This note is for documentation purposes only.

## 2020-08-03 ENCOUNTER — Encounter: Payer: Self-pay | Admitting: Emergency Medicine

## 2020-08-03 ENCOUNTER — Emergency Department
Admission: EM | Admit: 2020-08-03 | Discharge: 2020-08-04 | Disposition: A | Discharge status: Home / Self Care | Payer: Medicare Other | Attending: Emergency Medicine | Admitting: Emergency Medicine

## 2020-08-03 ENCOUNTER — Other Ambulatory Visit: Payer: Self-pay

## 2020-08-03 ENCOUNTER — Emergency Department: Payer: Medicare Other

## 2020-08-03 DIAGNOSIS — K3189 Other diseases of stomach and duodenum: Secondary | ICD-10-CM | POA: Diagnosis not present

## 2020-08-03 DIAGNOSIS — I700 Atherosclerosis of aorta: Secondary | ICD-10-CM | POA: Diagnosis not present

## 2020-08-03 DIAGNOSIS — Z96653 Presence of artificial knee joint, bilateral: Secondary | ICD-10-CM | POA: Insufficient documentation

## 2020-08-03 DIAGNOSIS — K5732 Diverticulitis of large intestine without perforation or abscess without bleeding: Secondary | ICD-10-CM | POA: Diagnosis not present

## 2020-08-03 DIAGNOSIS — R110 Nausea: Secondary | ICD-10-CM | POA: Insufficient documentation

## 2020-08-03 DIAGNOSIS — K5900 Constipation, unspecified: Secondary | ICD-10-CM | POA: Diagnosis not present

## 2020-08-03 DIAGNOSIS — I10 Essential (primary) hypertension: Secondary | ICD-10-CM | POA: Diagnosis not present

## 2020-08-03 DIAGNOSIS — Z79899 Other long term (current) drug therapy: Secondary | ICD-10-CM | POA: Diagnosis not present

## 2020-08-03 DIAGNOSIS — R109 Unspecified abdominal pain: Secondary | ICD-10-CM | POA: Diagnosis not present

## 2020-08-03 DIAGNOSIS — D3500 Benign neoplasm of unspecified adrenal gland: Secondary | ICD-10-CM | POA: Diagnosis not present

## 2020-08-03 DIAGNOSIS — Z87891 Personal history of nicotine dependence: Secondary | ICD-10-CM | POA: Insufficient documentation

## 2020-08-03 DIAGNOSIS — R1084 Generalized abdominal pain: Secondary | ICD-10-CM | POA: Diagnosis present

## 2020-08-03 DIAGNOSIS — E278 Other specified disorders of adrenal gland: Secondary | ICD-10-CM | POA: Diagnosis not present

## 2020-08-03 LAB — URINALYSIS, COMPLETE (UACMP) WITH MICROSCOPIC
Bacteria, UA: NONE SEEN
Bilirubin Urine: NEGATIVE
Glucose, UA: NEGATIVE mg/dL
Hgb urine dipstick: NEGATIVE
Ketones, ur: NEGATIVE mg/dL
Leukocytes,Ua: NEGATIVE
Nitrite: NEGATIVE
Protein, ur: NEGATIVE mg/dL
Specific Gravity, Urine: 1.011 (ref 1.005–1.030)
pH: 7 (ref 5.0–8.0)

## 2020-08-03 LAB — COMPREHENSIVE METABOLIC PANEL
ALT: 19 U/L (ref 0–44)
AST: 24 U/L (ref 15–41)
Albumin: 4.6 g/dL (ref 3.5–5.0)
Alkaline Phosphatase: 62 U/L (ref 38–126)
Anion gap: 15 (ref 5–15)
BUN: 20 mg/dL (ref 8–23)
CO2: 22 mmol/L (ref 22–32)
Calcium: 10.4 mg/dL — ABNORMAL HIGH (ref 8.9–10.3)
Chloride: 100 mmol/L (ref 98–111)
Creatinine, Ser: 1.1 mg/dL — ABNORMAL HIGH (ref 0.44–1.00)
GFR calc Af Amer: 58 mL/min — ABNORMAL LOW (ref >60)
GFR calc non Af Amer: 50 mL/min — ABNORMAL LOW (ref >60)
Glucose, Bld: 131 mg/dL — ABNORMAL HIGH (ref 70–99)
Potassium: 4.2 mmol/L (ref 3.5–5.1)
Sodium: 137 mmol/L (ref 135–145)
Total Bilirubin: 0.9 mg/dL (ref 0.3–1.2)
Total Protein: 8.2 g/dL — ABNORMAL HIGH (ref 6.5–8.1)

## 2020-08-03 LAB — CBC
HCT: 36.9 % (ref 36.0–46.0)
Hemoglobin: 12.2 g/dL (ref 12.0–15.0)
MCH: 31.3 pg (ref 26.0–34.0)
MCHC: 33.1 g/dL (ref 30.0–36.0)
MCV: 94.6 fL (ref 80.0–100.0)
Platelets: 353 10*3/uL (ref 150–400)
RBC: 3.9 MIL/uL (ref 3.87–5.11)
RDW: 12.9 % (ref 11.5–15.5)
WBC: 12.6 10*3/uL — ABNORMAL HIGH (ref 4.0–10.5)
nRBC: 0 % (ref 0.0–0.2)

## 2020-08-03 LAB — LIPASE, BLOOD: Lipase: 34 U/L (ref 11–51)

## 2020-08-03 MED ORDER — SODIUM CHLORIDE 0.9 % IV BOLUS
1000.0000 mL | Freq: Once | INTRAVENOUS | Status: AC
  Administered 2020-08-03: 1000 mL via INTRAVENOUS

## 2020-08-03 NOTE — ED Provider Notes (Signed)
[facility] Emergency Department Provider Note  ____________________________________________   First MD Initiated Contact with Patient 08/03/20 2305     (approximate)  I have reviewed the triage vital signs and the nursing notes.   HISTORY  Chief Complaint Constipation    HPI Taylor Barry is a 74 y.o. female with below list of previous medical conditions including hyperlipidemia obstructive sleep apnea right adrenal mass, diverticulitis and hypertension presents to the emergency department secondary to a 2-week history of constipation and generalized abdominal discomfort.  Patient also admits to nausea however no vomiting.  Patient also admits to chills at home.        Past Medical History:  Diagnosis Date  . Allergic rhinitis with postnasal drip   . Anxiety and depression   . Arthritis, multiple joint involvement   . Bronchitis, acute, with bronchospasm   . Hair loss   . HTN, goal below 150/90   . Hyperlipidemia LDL goal <100   . Obstructive sleep apnea, adult   . Right adrenal mass ([place])   . Vitamin D deficiency     Patient Active Problem List   Diagnosis Date Noted  . Chest pain 07/10/2018  . Medicare annual wellness visit, subsequent 09/22/2015  . Major depressive disorder, recurrent episode, in partial remission with anxious distress ([place]) 09/22/2015  . Insomnia 09/22/2015  . Need for immunization against influenza 09/22/2015  . Benign hypertension with CKD (chronic kidney disease), stage II 07/29/2015  . Adrenal mass ([place]) 07/28/2015  . Allergic rhinitis with postnasal drip 07/28/2015  . Anxiety and depression 07/28/2015  . Osteoarthritis of multiple joints 07/28/2015  . Alopecia 07/28/2015  . Hyperlipidemia LDL goal <100 07/28/2015  . Hypertension goal BP (blood pressure) < 150/90 07/28/2015  . Obstructive sleep apnea of adult 07/28/2015  . Borderline diabetes 07/28/2015  . Avitaminosis D 07/28/2015  . Adenomatous colon polyp  07/28/2015  . Other fatigue 07/28/2015  . H/O total knee replacement 10/08/2014  . Arthritis of knee, degenerative 07/24/2014    Past Surgical History:  Procedure Laterality Date  . ABDOMINAL HYSTERECTOMY  1968   total  . REPLACEMENT TOTAL KNEE Bilateral    Right knee first, Left knee more recent  . TYMPANOPLASTY Right 1977    Prior to Admission medications   Medication Sig Start Date End Date Taking? Authorizing Provider  amoxicillin-clavulanate (AUGMENTIN) 875-125 MG tablet Take 1 tablet by mouth 3 (three) times daily for 10 days. 08/04/20 08/14/20  Gregor Hams, MD  celecoxib (CELEBREX) 200 MG capsule Take 200 mg by mouth daily.    [provider]  losartan-hydrochlorothiazide (HYZAAR) 100-12.5 MG tablet Take 1 tablet by mouth daily. 02/16/16   Bobetta Lime, MD  LYRICA 50 MG capsule Take 50 mg by mouth 2 (two) times daily.    [provider]  Multiple Vitamins-Minerals (MULTIVITAMIN WITH MINERALS) tablet Take 1 tablet by mouth daily.    [provider]  Pitavastatin Calcium (LIVALO) 2 MG TABS Take 2 mg by mouth daily.    [provider]    Allergies Codeine, Hydrocodone-acetaminophen, Oxycodone-acetaminophen, and Prednisolone  Family History  Problem Relation Age of Onset  . Heart disease Mother   . Alcohol abuse Father   . Diabetes Daughter   . Breast cancer Maternal Aunt   . Cancer Brother   . Cancer Maternal Grandmother   . Cancer Maternal Grandfather     Social History Social History   Tobacco Use  . Smoking status: Former Smoker  Packs/day: 0.25    Quit date: 01/25/1995    Years since quitting: 25.5  . Smokeless tobacco: Never Used  Substance Use Topics  . Alcohol use: No    Alcohol/week: 0.0 standard drinks  . Drug use: No    Review of Systems Constitutional: No fever.  Positive for chills Eyes: No visual changes. ENT: No sore throat. Cardiovascular: Denies chest pain. Respiratory: Denies shortness of  breath. Gastrointestinal: Positive for abdominal pain and constipation.  Positive for nausea Genitourinary: Negative for dysuria. Musculoskeletal: Negative for neck pain.  Negative for back pain. Integumentary: Negative for rash. Neurological: Negative for headaches, focal weakness or numbness.   ____________________________________________   PHYSICAL EXAM:  VITAL SIGNS: ED Triage Vitals  Enc Vitals Group     BP 08/03/20 1148 (!) 155/86     Pulse Rate 08/03/20 1148 94     Resp 08/03/20 1148 20     Temp 08/03/20 1148 98.2 F (36.8 C)     Temp Source 08/03/20 1148 Oral     SpO2 08/03/20 1148 98 %     Weight 08/03/20 1148 117.9 kg (260 lb)     Height 08/03/20 1148 1.676 m (5\' 6" )     Head Circumference --      Peak Flow --      Pain Score 08/03/20 1155 9     Pain Loc --      Pain Edu? --      Excl. in [place]? --     Constitutional: Alert and oriented. Eyes: Conjunctivae are normal.  Head: Atraumatic. Mouth/Throat: Patient is wearing a mask. Neck: No stridor.  No meningeal signs.   Cardiovascular: Normal rate, regular rhythm. Good peripheral circulation. Grossly normal heart sounds. Respiratory: Normal respiratory effort.  No retractions. Gastrointestinal: Generalized tenderness to palpation, worse left lower quadrant no guarding no rebound Musculoskeletal: No lower extremity tenderness nor edema. No gross deformities of extremities. Neurologic:  Normal speech and language. No gross focal neurologic deficits are appreciated.  Skin:  Skin is warm, dry and intact. Psychiatric: Mood and affect are normal. Speech and behavior are normal.  ____________________________________________   LABS (all labs ordered are listed, but only abnormal results are displayed)  Labs Reviewed  COMPREHENSIVE METABOLIC PANEL - Abnormal; Notable for the following components:      Result Value   Glucose, Bld 131 (*)    Creatinine, Ser 1.10 (*)    Calcium 10.4 (*)    Total Protein 8.2 (*)     GFR calc non Af Amer 50 (*)    GFR calc Af Amer 58 (*)    All other components within normal limits  CBC - Abnormal; Notable for the following components:   WBC 12.6 (*)    All other components within normal limits  URINALYSIS, COMPLETE (UACMP) WITH MICROSCOPIC - Abnormal; Notable for the following components:   Color, Urine YELLOW (*)    APPearance CLEAR (*)    All other components within normal limits  LIPASE, BLOOD     RADIOLOGY I, [place] Chen Saadeh, personally viewed and evaluated these images (plain radiographs) as part of my medical decision making, as well as reviewing the written report by the radiologist.  ED MD interpretation: Acute sigmoid diverticulitis with inflammation throughout the left hemipelvis no evidence of perforation or abscess  Official radiology report(s): CT ABDOMEN PELVIS WO CONTRAST  Result Date: 08/04/2020 CLINICAL DATA:  74 year old female with abdominal pain, constipation. EXAM: CT ABDOMEN AND PELVIS WITHOUT CONTRAST TECHNIQUE: Multidetector CT imaging of the  abdomen and pelvis was performed following the standard protocol without IV contrast. COMPARISON:  CTA abdomen pelvis and runoff 07/05/2020. FINDINGS: Lower chest: Larger lung volumes. Negative lung bases. Calcified coronary artery atherosclerosis. No pericardial or pleural effusion. Hepatobiliary: Negative noncontrast liver and gallbladder. Pancreas: Negative. Spleen: Negative. Adrenals/Urinary Tract: Stable 4 cm right adrenal nodule has low-density on noncontrast CT (just under 10 Hounsfield units) suggesting a benign adenoma. Normal left adrenal gland. Negative noncontrast kidneys. The left ureter passes through the area of pelvic inflammation but appears to remain normal to the bladder. There are numerous pelvic phleboliths superimposed, including 1 adjacent to the distal left ureter on coronal image 68. Unremarkable urinary bladder. Stomach/Bowel: Negative rectum. Inflamed and indistinct sigmoid colon  throughout the pelvis (series 2, image 79) with extensive underlying diverticulosis. Circumferential wall thickening and sigmoid mesentery stranding. Secondary inflammation of the left ovary near the pelvic sidewall. But no extraluminal gas or drainable fluid. Diverticulosis continues in the descending colon where there is retained stool but no bowel dilatation. Retained stool also in the redundant transverse colon. Oral contrast has reached the splenic flexure. The ileocecal valve today appears normal (on series 2, image 71). No dilated small bowel. Decompressed stomach and duodenum. No free air, free fluid. Vascular/Lymphatic: Aortoiliac calcified atherosclerosis. Vascular patency is not evaluated in the absence of IV contrast. No lymphadenopathy. Reproductive: Surgically absent uterus. The right ovary is diminutive or absent. There is secondary inflammation of the left ovary (series 2, image 73) which is in proximity to the abnormal sigmoid colon. Other: No pelvic free fluid. Musculoskeletal: Chronic degenerative changes in the spine. No acute osseous abnormality identified. IMPRESSION: 1. Acute sigmoid diverticulitis with inflammation throughout the left hemipelvis. Secondarily inflamed left ovary. No evidence of perforation or abscess. 2. No other acute or inflammatory process in the non-contrast abdomen or pelvis. The ileocecal valve has a normal appearance today. Benign right adrenal adenoma. Calcified coronary artery and Aortic Atherosclerosis (ICD10-I70.0). Electronically Signed   By: Genevie Ann M.D.   On: 08/04/2020 02:09   DG Abdomen 1 View  Result Date: 08/03/2020 CLINICAL DATA:  Abdominal pain constipation EXAM: ABDOMEN - 1 VIEW COMPARISON:  None. FINDINGS: The bowel gas pattern is nonobstructive. Moderate volume of stool throughout the colon. No radio-opaque calculi or other significant radiographic abnormality are seen. Dextro scoliotic lumbar curvature. IMPRESSION: Nonobstructive bowel gas pattern.  Moderate volume of stool throughout the colon. Electronically Signed   By: Davina Poke D.O.   On: 08/03/2020 12:49    _______________________________________   Procedures   ____________________________________________   INITIAL IMPRESSION / MDM / [place] / ED COURSE  As part of my medical decision making, I reviewed the following data within the electronic MEDICAL RECORD NUMBER   74 year old female presented with above-stated history and physical exam a differential diagnosis including but not limited to constipation, obstruction, ileus, diverticulitis.  CT scan of the abdomen pelvis was performed which revealed evidence of sigmoid diverticulitis without evidence of perforation or abscess.  Patient states that she was prescribed a biotic by her primary care provider for diverticulitis.  Patient showed me a bottle of Flagyl which she had in her purse which was for 14 pills which patient states that she has been compliant with taking.  I prescribed the patient Augmentin 875 mg 3 times daily.  In addition I gave the patient a single dose of lactulose for constipation.  Spoke with the patient and her daughter at length regarding warning signs of warrant immediate return to  the emergency department.  Also discussed the possible complications that may occur with diverticulitis. ____________________________________________  FINAL CLINICAL IMPRESSION(S) / ED DIAGNOSES  Final diagnoses:  Constipation, unspecified constipation type  Diverticulitis of large intestine without perforation or abscess without bleeding     MEDICATIONS GIVEN DURING THIS VISIT:  Medications  iohexol (OMNIPAQUE) 9 MG/ML oral solution 500 mL (500 mLs Oral Contrast Given 08/04/20 0017)  sodium chloride 0.9 % bolus 1,000 mL (0 mLs Intravenous Stopped 08/04/20 0202)  amoxicillin-clavulanate (AUGMENTIN) 875-125 MG per tablet 1 tablet (1 tablet Oral Given 08/04/20 0241)  lactulose (CHRONULAC) 10 GM/15ML solution 30  g (30 g Oral Given 08/04/20 0241)  docusate sodium (COLACE) capsule 100 mg (100 mg Oral Given 08/04/20 0246)     ED Discharge Orders         Ordered    amoxicillin-clavulanate (AUGMENTIN) 875-125 MG tablet  3 times daily     Discontinue  Reprint     08/04/20 0236          *Please note:  AMERY VANDENBOS was evaluated in Emergency Department on 08/04/2020 for the symptoms described in the history of present illness. She was evaluated in the context of the global COVID-19 pandemic, which necessitated consideration that the patient might be at risk for infection with the SARS-CoV-2 virus that causes COVID-19. Institutional protocols and algorithms that pertain to the evaluation of patients at risk for COVID-19 are in a state of rapid change based on information released by regulatory bodies including the CDC and federal and state organizations. These policies and algorithms were followed during the patient's care in the ED.  Some ED evaluations and interventions may be delayed as a result of limited staffing during and after the pandemic.*  Note:  This document was prepared using Dragon voice recognition software and may include unintentional dictation errors.   Gregor Hams, MD 08/04/20 [phone]

## 2020-08-03 NOTE — ED Notes (Signed)
Pt reports constipation for 2 weeks. States small marble size stool yesterday.  abd distended and states it hurts all over.  Pt taking mirilax without relief.  No vomiting.  Intermittent nausea.    Family with pt.  Pt alert  Speech clear.

## 2020-08-03 NOTE — ED Triage Notes (Signed)
Patient reports not having BM in 2 weeks despite trying Miralax. Patient reports + suprapubic pain.

## 2020-08-04 ENCOUNTER — Emergency Department: Payer: Medicare Other

## 2020-08-04 DIAGNOSIS — E278 Other specified disorders of adrenal gland: Secondary | ICD-10-CM | POA: Diagnosis not present

## 2020-08-04 DIAGNOSIS — K5900 Constipation, unspecified: Secondary | ICD-10-CM | POA: Diagnosis not present

## 2020-08-04 DIAGNOSIS — I700 Atherosclerosis of aorta: Secondary | ICD-10-CM | POA: Diagnosis not present

## 2020-08-04 DIAGNOSIS — D3500 Benign neoplasm of unspecified adrenal gland: Secondary | ICD-10-CM | POA: Diagnosis not present

## 2020-08-04 DIAGNOSIS — K3189 Other diseases of stomach and duodenum: Secondary | ICD-10-CM | POA: Diagnosis not present

## 2020-08-04 MED ORDER — IOHEXOL 9 MG/ML PO SOLN
500.0000 mL | ORAL | Status: AC
  Administered 2020-08-04: 500 mL via ORAL

## 2020-08-04 MED ORDER — DOCUSATE SODIUM 100 MG PO CAPS
100.0000 mg | ORAL_CAPSULE | Freq: Once | ORAL | Status: AC
  Administered 2020-08-04: 100 mg via ORAL
  Filled 2020-08-04: qty 1

## 2020-08-04 MED ORDER — LACTULOSE 10 GM/15ML PO SOLN
30.0000 g | Freq: Once | ORAL | Status: AC
  Administered 2020-08-04: 30 g via ORAL
  Filled 2020-08-04: qty 60

## 2020-08-04 MED ORDER — AMOXICILLIN-POT CLAVULANATE 875-125 MG PO TABS: 1.0000 | ORAL_TABLET | Freq: Three times a day (TID) | ORAL | 0 refills | Status: AC

## 2020-08-04 MED ORDER — AMOXICILLIN-POT CLAVULANATE 875-125 MG PO TABS
1.0000 | ORAL_TABLET | Freq: Once | ORAL | Status: AC
  Administered 2020-08-04: 1 via ORAL
  Filled 2020-08-04: qty 1

## 2020-08-12 ENCOUNTER — Encounter (INDEPENDENT_AMBULATORY_CARE_PROVIDER_SITE_OTHER): Payer: Self-pay | Admitting: Nurse Practitioner

## 2020-08-12 ENCOUNTER — Encounter (INDEPENDENT_AMBULATORY_CARE_PROVIDER_SITE_OTHER): Payer: Self-pay

## 2020-08-31 DIAGNOSIS — I6523 Occlusion and stenosis of bilateral carotid arteries: Secondary | ICD-10-CM | POA: Diagnosis not present

## 2020-08-31 DIAGNOSIS — E1142 Type 2 diabetes mellitus with diabetic polyneuropathy: Secondary | ICD-10-CM | POA: Diagnosis not present

## 2020-08-31 DIAGNOSIS — I10 Essential (primary) hypertension: Secondary | ICD-10-CM | POA: Diagnosis not present

## 2020-08-31 DIAGNOSIS — E782 Mixed hyperlipidemia: Secondary | ICD-10-CM | POA: Diagnosis not present

## 2020-08-31 DIAGNOSIS — E1165 Type 2 diabetes mellitus with hyperglycemia: Secondary | ICD-10-CM | POA: Diagnosis not present

## 2020-08-31 DIAGNOSIS — F329 Major depressive disorder, single episode, unspecified: Secondary | ICD-10-CM | POA: Diagnosis not present

## 2020-08-31 DIAGNOSIS — F411 Generalized anxiety disorder: Secondary | ICD-10-CM | POA: Diagnosis not present

## 2020-08-31 DIAGNOSIS — Z23 Encounter for immunization: Secondary | ICD-10-CM | POA: Diagnosis not present

## 2020-09-09 ENCOUNTER — Other Ambulatory Visit (INDEPENDENT_AMBULATORY_CARE_PROVIDER_SITE_OTHER): Payer: Self-pay | Admitting: Vascular Surgery

## 2020-09-09 DIAGNOSIS — E1142 Type 2 diabetes mellitus with diabetic polyneuropathy: Secondary | ICD-10-CM | POA: Diagnosis not present

## 2020-09-09 DIAGNOSIS — D510 Vitamin B12 deficiency anemia due to intrinsic factor deficiency: Secondary | ICD-10-CM | POA: Diagnosis not present

## 2020-09-09 DIAGNOSIS — E559 Vitamin D deficiency, unspecified: Secondary | ICD-10-CM | POA: Diagnosis not present

## 2020-09-09 DIAGNOSIS — I10 Essential (primary) hypertension: Secondary | ICD-10-CM | POA: Diagnosis not present

## 2020-09-09 DIAGNOSIS — E039 Hypothyroidism, unspecified: Secondary | ICD-10-CM | POA: Diagnosis not present

## 2020-09-09 DIAGNOSIS — I83819 Varicose veins of unspecified lower extremities with pain: Secondary | ICD-10-CM

## 2020-09-09 DIAGNOSIS — E782 Mixed hyperlipidemia: Secondary | ICD-10-CM | POA: Diagnosis not present

## 2020-09-14 ENCOUNTER — Encounter (INDEPENDENT_AMBULATORY_CARE_PROVIDER_SITE_OTHER): Payer: Self-pay

## 2020-09-14 ENCOUNTER — Encounter (INDEPENDENT_AMBULATORY_CARE_PROVIDER_SITE_OTHER): Payer: Medicare Other | Admitting: Vascular Surgery

## 2020-09-16 DIAGNOSIS — E875 Hyperkalemia: Secondary | ICD-10-CM | POA: Diagnosis not present

## 2020-09-22 ENCOUNTER — Ambulatory Visit (INDEPENDENT_AMBULATORY_CARE_PROVIDER_SITE_OTHER): Payer: Medicare Other

## 2020-09-22 ENCOUNTER — Encounter (INDEPENDENT_AMBULATORY_CARE_PROVIDER_SITE_OTHER): Payer: Self-pay | Admitting: Nurse Practitioner

## 2020-09-22 ENCOUNTER — Other Ambulatory Visit: Payer: Self-pay

## 2020-09-22 ENCOUNTER — Ambulatory Visit (INDEPENDENT_AMBULATORY_CARE_PROVIDER_SITE_OTHER): Payer: Medicare Other | Admitting: Nurse Practitioner

## 2020-09-22 VITALS — BP 177/82 | HR 72 | Ht 66.0 in | Wt 248.0 lb

## 2020-09-22 DIAGNOSIS — I10 Essential (primary) hypertension: Secondary | ICD-10-CM

## 2020-09-22 DIAGNOSIS — M158 Other polyosteoarthritis: Secondary | ICD-10-CM

## 2020-09-22 DIAGNOSIS — I83819 Varicose veins of unspecified lower extremities with pain: Secondary | ICD-10-CM

## 2020-09-22 DIAGNOSIS — I83813 Varicose veins of bilateral lower extremities with pain: Secondary | ICD-10-CM

## 2020-09-22 DIAGNOSIS — M79605 Pain in left leg: Secondary | ICD-10-CM

## 2020-09-22 DIAGNOSIS — M79604 Pain in right leg: Secondary | ICD-10-CM

## 2020-09-23 ENCOUNTER — Encounter (INDEPENDENT_AMBULATORY_CARE_PROVIDER_SITE_OTHER): Payer: Self-pay | Admitting: Nurse Practitioner

## 2020-09-23 NOTE — Progress Notes (Signed)
Subjective:    Patient ID: Taylor Barry, female    DOB: 1946-05-08, 74 y.o.   MRN: [id number] Chief Complaint  Patient presents with  . New Patient (Initial Visit)    Varicos veins w/pain ven reflux    Patient presents today as referral from her primary care physician in regards to varicose veins with leg pain.  The patient notes that she has cramping in her bilateral lower extremities nearly on a constant basis.  She notes that this sometimes becomes worse in bed at night she has no feeling in her legs from about the shin area to the ankle.  She also feels that she cannot be still.  In order for her to get any rest she has to get up and walk and move throughout the day.  The patient notes that this soreness and tenderness has been ongoing for approximately the last 6 months or so.  Prior to all these events happening the patient was receiving injections in her back for known degenerative disc disease.  The patient notes that she would normally get 1 injection in her back however one session she received 6 sessions in her back.  Following this injection session she noted that the next morning she woke up with a large yellow spot underneath her and subsequently has not felt right since that day.  The patient's daughter is also here to provide some history and she notes that it is almost as if the patient had a stroke during that time.  She became very confused with other neurological episodes such as weakness to accompany.  Her mental faculties have returned as of recently but the pain in her bilateral lower extremities persists.  The patient does have spider varicosities bilaterally with a large varicosity that is prominent in her right lower extremity.  The patient notes that this varicosity is sore and tender and painful to the touch.  She denies any fever, chills, nausea, vomiting or diarrhea.  The patient also has a history of diverticulitis and she notes some right-sided abdominal pain.  She denies  any postprandial symptoms.  She denies any nausea vomiting or diarrhea.  She denies any lack of appetite or sudden loss of weight.   Prior to her visit to the office the patient had a number of studies done at an outside facility.  On 06/18/2020 the patient had ABIs done which show an ABI 1.24 on the right and an ABI of 1.31 on the left.  The waveforms appear to be triphasic.  The patient also had a carotid duplex done which showed mild vertebral disease bilaterally.  This was also done on 06/18/2020.  Subsequently the patient had a CT scan done on her neck on 07/05/2020 which showed the bilateral common and internal carotid arteries are patent without any dynamically significant stenoses.  There is an incidentally noted apparent right subclavian artery.  Also on 07/05/2020 the patient had a CT angiogram of the aorta and bifemoral runoff.  It was noted that the patient had atherosclerotic disease within the aorta and iliac arteries however there was no significant stenosis.  There was no significant inflow or outflow disease in the lower extremities.  There appeared to be at least two-vessel runoff bilaterally.  This was somewhat limited below the knee due to previous knee replacements.  There is also what appears to be some calcified plaque at the origin of the celiac trunk which may cause significant stenosis.  The IMA and SMA are patent.  It was also noted that she has varicosities in the right lower extremity in the great saphenous vein however the left lower extremity findings are nonsignificant.  Today she underwent a bilateral lower extremity venous reflux study.  During the study it was noted that the patient had no evidence of DVT or superficial venous thrombosis bilaterally.  The left lower extremity has no evidence of deep venous insufficiency or superficial venous reflux.  The right lower extremity has evidence of superficial venous reflux in the great saphenous vein at the saphenofemoral junction  extending to the proximal calf.   Review of Systems  Cardiovascular: Positive for leg swelling.  Musculoskeletal: Positive for back pain and gait problem.  Neurological: Positive for numbness.  All other systems reviewed and are negative.      Objective:   Physical Exam Vitals reviewed.  HENT:     Head: Normocephalic.  Cardiovascular:     Rate and Rhythm: Normal rate and regular rhythm.     Pulses: Normal pulses.     Comments: Palpable large varicosity in the right lower extremity.  Scattered spider varicosities bilaterally. Pulmonary:     Effort: Pulmonary effort is normal.  Musculoskeletal:     Right lower leg: Edema present.  Skin:    General: Skin is warm and dry.  Neurological:     Mental Status: She is alert and oriented to person, place, and time.  Psychiatric:        Mood and Affect: Mood normal.        Behavior: Behavior normal.        Thought Content: Thought content normal.        Judgment: Judgment normal.     BP (!) 177/82   Pulse 72   Ht 5\' 6"  (1.676 m)   Wt 248 lb (112.5 kg)   BMI 40.03 kg/m   Past Medical History:  Diagnosis Date  . Allergic rhinitis with postnasal drip   . Anxiety and depression   . Arthritis, multiple joint involvement   . Bronchitis, acute, with bronchospasm   . Hair loss   . HTN, goal below 150/90   . Hyperlipidemia LDL goal <100   . Obstructive sleep apnea, adult   . Right adrenal mass ([place])   . Vitamin D deficiency     Social History   Socioeconomic History  . Marital status: Divorced    Spouse name: Not on file  . Number of children: Not on file  . Years of education: Not on file  . Highest education level: Not on file  Occupational History  . Not on file  Tobacco Use  . Smoking status: Former Smoker    Packs/day: 0.25    Quit date: 01/25/1995    Years since quitting: 25.6  . Smokeless tobacco: Never Used  Substance and Sexual Activity  . Alcohol use: No    Alcohol/week: 0.0 standard drinks  . Drug use:  No  . Sexual activity: Never  Other Topics Concern  . Not on file  Social History Narrative  . Not on file   Social Determinants of Health   Financial Resource Strain:   . Difficulty of Paying Living Expenses: Not on file  Food Insecurity:   . Worried About Charity fundraiser in the Last Year: Not on file  . Ran Out of Food in the Last Year: Not on file  Transportation Needs:   . Lack of Transportation (Medical): Not on file  . Lack of Transportation (Non-Medical): Not on  file  Physical Activity:   . Days of Exercise per Week: Not on file  . Minutes of Exercise per Session: Not on file  Stress:   . Feeling of Stress : Not on file  Social Connections:   . Frequency of Communication with Friends and Family: Not on file  . Frequency of Social Gatherings with Friends and Family: Not on file  . Attends Religious Services: Not on file  . Active Member of Clubs or Organizations: Not on file  . Attends Archivist Meetings: Not on file  . Marital Status: Not on file  Intimate Partner Violence:   . Fear of Current or Ex-Partner: Not on file  . Emotionally Abused: Not on file  . Physically Abused: Not on file  . Sexually Abused: Not on file    Past Surgical History:  Procedure Laterality Date  . ABDOMINAL HYSTERECTOMY  1968   total  . REPLACEMENT TOTAL KNEE Bilateral    Right knee first, Left knee more recent  . TYMPANOPLASTY Right 1977    Family History  Problem Relation Age of Onset  . Heart disease Mother   . Alcohol abuse Father   . Diabetes Daughter   . Breast cancer Maternal Aunt   . Cancer Brother   . Cancer Maternal Grandmother   . Cancer Maternal Grandfather     Allergies  Allergen Reactions  . Codeine Nausea Only  . Hydrocodone-Acetaminophen Other (See Comments)    "Puts me up the wall"  . No Known Allergies     Note: Muscle aches  . Oxycodone-Acetaminophen     Hallucination  . Prednisolone     Hallucination       Assessment & Plan:    1. Varicose veins of both lower extremities with pain Based on noninvasive studies today the patient does have evidence of extensive venous reflux in the right lower extremity.  This likely accounts for the tenderness that she feels over the varicosity but it is not account for the pain that she is having bilaterally.  The patient is advised to begin with conservative therapy of wearing medical grade 1 compression stockings in addition to elevation and exercise as possible.  We will have the patient back in 3 months to discuss possible endovenous laser intervention.  This is also to allow the patient for the time to work-up her lower extremity leg pain symptoms.  2. Essential hypertension Continue antihypertensive medications as already ordered, these medications have been reviewed and there are no changes at this time.   3. Other osteoarthritis involving multiple joints Continue NSAID medications as already ordered, these medications have been reviewed and there are no changes at this time.  Continued activity and therapy was stressed.   4. Pain in both lower extremities Based on the patient's extensive noninvasive studies the predominant pain that the patient is having is likely not related to vascular issues.  If this were related to her varicose veins, extensive reflux would have been seen in her bilateral lower extremities versus just in her right lower extremity.  Because the patient notes that the pain is no different in either lower extremity, it is likely that this is related to her degenerative disc disease in her back.  What is concerning is about the drainage that the patient noted following her surgery.  It sounds suggestive of possible CSF fluid.  Her symptoms also sound somewhat suspicious for restless leg syndrome or possible neuropathy.  We will place a referral to neurology for  further work-up of lower extremity pain.  Patient is also advised to contact her orthopedic doctor for  further evaluation of her spine.   Current Outpatient Medications on File Prior to Visit  Medication Sig Dispense Refill  . aspirin 81 MG chewable tablet Chew by mouth.    . hydrochlorothiazide (HYDRODIURIL) 12.5 MG tablet Take 12.5 mg by mouth at bedtime.    Marland Kitchen ibuprofen (ADVIL) 100 MG tablet Take 800 mg by mouth every 6 (six) hours as needed for fever.    Marland Kitchen LYRICA 50 MG capsule Take 50 mg by mouth 2 (two) times daily.    . metFORMIN (GLUCOPHAGE-XR) 500 MG 24 hr tablet Take 500 mg by mouth 2 (two) times daily.    . Multiple Vitamins-Minerals (MULTIVITAMIN WITH MINERALS) tablet Take 1 tablet by mouth daily.    Marland Kitchen NEXLETOL 180 MG TABS Take 1 tablet by mouth daily.    . Pitavastatin Calcium (LIVALO) 2 MG TABS Take 2 mg by mouth daily.    . celecoxib (CELEBREX) 200 MG capsule Take 200 mg by mouth daily. (Patient not taking: Reported on 09/22/2020)     No current facility-administered medications on file prior to visit.    There are no Patient Instructions on file for this visit. No follow-ups on file.   Kris Hartmann, NP

## 2020-10-19 DIAGNOSIS — E531 Pyridoxine deficiency: Secondary | ICD-10-CM | POA: Diagnosis not present

## 2020-10-19 DIAGNOSIS — G8929 Other chronic pain: Secondary | ICD-10-CM | POA: Diagnosis not present

## 2020-10-19 DIAGNOSIS — E559 Vitamin D deficiency, unspecified: Secondary | ICD-10-CM | POA: Diagnosis not present

## 2020-10-19 DIAGNOSIS — R202 Paresthesia of skin: Secondary | ICD-10-CM | POA: Diagnosis not present

## 2020-10-19 DIAGNOSIS — R200 Anesthesia of skin: Secondary | ICD-10-CM | POA: Diagnosis not present

## 2020-10-19 DIAGNOSIS — E519 Thiamine deficiency, unspecified: Secondary | ICD-10-CM | POA: Diagnosis not present

## 2020-10-19 DIAGNOSIS — E538 Deficiency of other specified B group vitamins: Secondary | ICD-10-CM | POA: Diagnosis not present

## 2020-10-19 DIAGNOSIS — M545 Low back pain, unspecified: Secondary | ICD-10-CM | POA: Diagnosis not present

## 2020-10-19 DIAGNOSIS — M4714 Other spondylosis with myelopathy, thoracic region: Secondary | ICD-10-CM | POA: Diagnosis not present

## 2020-10-19 DIAGNOSIS — R29898 Other symptoms and signs involving the musculoskeletal system: Secondary | ICD-10-CM | POA: Diagnosis not present

## 2020-10-25 ENCOUNTER — Encounter (INDEPENDENT_AMBULATORY_CARE_PROVIDER_SITE_OTHER): Payer: Self-pay

## 2020-11-26 DIAGNOSIS — M5104 Intervertebral disc disorders with myelopathy, thoracic region: Secondary | ICD-10-CM | POA: Diagnosis not present

## 2020-11-26 DIAGNOSIS — M5126 Other intervertebral disc displacement, lumbar region: Secondary | ICD-10-CM | POA: Diagnosis not present

## 2020-11-26 DIAGNOSIS — M4804 Spinal stenosis, thoracic region: Secondary | ICD-10-CM | POA: Diagnosis not present

## 2020-11-26 DIAGNOSIS — M4714 Other spondylosis with myelopathy, thoracic region: Secondary | ICD-10-CM | POA: Diagnosis not present

## 2020-11-26 DIAGNOSIS — M48061 Spinal stenosis, lumbar region without neurogenic claudication: Secondary | ICD-10-CM | POA: Diagnosis not present

## 2020-11-26 DIAGNOSIS — M4807 Spinal stenosis, lumbosacral region: Secondary | ICD-10-CM | POA: Diagnosis not present

## 2020-12-03 DIAGNOSIS — R200 Anesthesia of skin: Secondary | ICD-10-CM | POA: Diagnosis not present

## 2020-12-03 DIAGNOSIS — R202 Paresthesia of skin: Secondary | ICD-10-CM | POA: Diagnosis not present

## 2020-12-06 DIAGNOSIS — M4800 Spinal stenosis, site unspecified: Secondary | ICD-10-CM | POA: Diagnosis not present

## 2020-12-06 DIAGNOSIS — E1142 Type 2 diabetes mellitus with diabetic polyneuropathy: Secondary | ICD-10-CM | POA: Diagnosis not present

## 2020-12-06 DIAGNOSIS — M4714 Other spondylosis with myelopathy, thoracic region: Secondary | ICD-10-CM | POA: Diagnosis not present

## 2020-12-06 DIAGNOSIS — Z23 Encounter for immunization: Secondary | ICD-10-CM | POA: Diagnosis not present

## 2020-12-06 DIAGNOSIS — E669 Obesity, unspecified: Secondary | ICD-10-CM | POA: Diagnosis not present

## 2020-12-06 DIAGNOSIS — M5410 Radiculopathy, site unspecified: Secondary | ICD-10-CM | POA: Diagnosis not present

## 2020-12-14 ENCOUNTER — Ambulatory Visit (INDEPENDENT_AMBULATORY_CARE_PROVIDER_SITE_OTHER): Payer: Medicare Other | Admitting: Vascular Surgery

## 2021-02-03 DIAGNOSIS — M4800 Spinal stenosis, site unspecified: Secondary | ICD-10-CM | POA: Diagnosis not present

## 2021-02-03 DIAGNOSIS — M4804 Spinal stenosis, thoracic region: Secondary | ICD-10-CM | POA: Diagnosis not present

## 2021-02-03 DIAGNOSIS — E785 Hyperlipidemia, unspecified: Secondary | ICD-10-CM | POA: Diagnosis not present

## 2021-02-03 DIAGNOSIS — G4739 Other sleep apnea: Secondary | ICD-10-CM | POA: Diagnosis not present

## 2021-02-03 DIAGNOSIS — M5441 Lumbago with sciatica, right side: Secondary | ICD-10-CM | POA: Diagnosis not present

## 2021-02-03 DIAGNOSIS — I10 Essential (primary) hypertension: Secondary | ICD-10-CM | POA: Diagnosis not present

## 2021-02-03 DIAGNOSIS — G8929 Other chronic pain: Secondary | ICD-10-CM | POA: Diagnosis not present

## 2021-02-03 DIAGNOSIS — E118 Type 2 diabetes mellitus with unspecified complications: Secondary | ICD-10-CM | POA: Diagnosis not present

## 2021-02-04 ENCOUNTER — Other Ambulatory Visit: Payer: Self-pay | Admitting: Internal Medicine

## 2021-02-04 DIAGNOSIS — Z1231 Encounter for screening mammogram for malignant neoplasm of breast: Secondary | ICD-10-CM

## 2021-03-02 ENCOUNTER — Ambulatory Visit: Payer: Medicare Other

## 2021-03-10 ENCOUNTER — Ambulatory Visit: Payer: Medicare Other

## 2021-03-21 ENCOUNTER — Ambulatory Visit
Admission: RE | Admit: 2021-03-21 | Discharge: 2021-03-21 | Disposition: A | Discharge status: Home / Self Care | Payer: Medicare Other | Source: Ambulatory Visit | Attending: Internal Medicine | Admitting: Internal Medicine

## 2021-03-21 ENCOUNTER — Other Ambulatory Visit: Payer: Self-pay

## 2021-03-21 DIAGNOSIS — Z1231 Encounter for screening mammogram for malignant neoplasm of breast: Secondary | ICD-10-CM | POA: Diagnosis not present

## 2021-03-24 ENCOUNTER — Telehealth (INDEPENDENT_AMBULATORY_CARE_PROVIDER_SITE_OTHER): Payer: Self-pay | Admitting: Gastroenterology

## 2021-03-24 ENCOUNTER — Other Ambulatory Visit: Payer: Self-pay

## 2021-03-24 DIAGNOSIS — E119 Type 2 diabetes mellitus without complications: Secondary | ICD-10-CM | POA: Diagnosis not present

## 2021-03-24 DIAGNOSIS — Z86010 Personal history of colonic polyps: Secondary | ICD-10-CM

## 2021-03-24 DIAGNOSIS — G4733 Obstructive sleep apnea (adult) (pediatric): Secondary | ICD-10-CM | POA: Diagnosis not present

## 2021-03-24 DIAGNOSIS — I10 Essential (primary) hypertension: Secondary | ICD-10-CM | POA: Diagnosis not present

## 2021-03-24 DIAGNOSIS — E785 Hyperlipidemia, unspecified: Secondary | ICD-10-CM | POA: Diagnosis not present

## 2021-03-24 MED ORDER — NA SULFATE-K SULFATE-MG SULF 17.5-3.13-1.6 GM/177ML PO SOLN: 1.0000 | Freq: Once | ORAL | 0 refills | Status: AC

## 2021-03-24 NOTE — Progress Notes (Signed)
Gastroenterology Pre-Procedure Review  Request Date: 04/15/21 Requesting Physician: Dr. Allen Norris  PATIENT REVIEW QUESTIONS: The patient responded to the following health history questions as indicated:    1. Are you having any GI issues? no 2. Do you have a personal history of Polyps? Yes 03/05/15 colonoscopy performed by Dr. Allen Norris 3. Do you have a family history of Colon Cancer or Polyps? no 4. Diabetes Mellitus?yes  5. Joint replacements in the past 12 months?no 6. Major health problems in the past 3 months?no 7. Any artificial heart valves, MVP, or defibrillator?no    MEDICATIONS & ALLERGIES:    Patient reports the following regarding taking any anticoagulation/antiplatelet therapy:   Plavix, Coumadin, Eliquis, Xarelto, Lovenox, Pradaxa, Brilinta, or Effient? no Aspirin? Yes 81 mg daily  Patient confirms/reports the following medications:  Current Outpatient Medications  Medication Sig Dispense Refill  . aspirin 81 MG chewable tablet Chew by mouth.    . celecoxib (CELEBREX) 200 MG capsule Take 200 mg by mouth daily. (Patient not taking: Reported on 09/22/2020)    . hydrochlorothiazide (HYDRODIURIL) 12.5 MG tablet Take 12.5 mg by mouth at bedtime.    Marland Kitchen ibuprofen (ADVIL) 100 MG tablet Take 800 mg by mouth every 6 (six) hours as needed for fever.    Marland Kitchen LYRICA 50 MG capsule Take 50 mg by mouth 2 (two) times daily.    . metFORMIN (GLUCOPHAGE-XR) 500 MG 24 hr tablet Take 500 mg by mouth 2 (two) times daily.    . Multiple Vitamins-Minerals (MULTIVITAMIN WITH MINERALS) tablet Take 1 tablet by mouth daily.    Marland Kitchen NEXLETOL 180 MG TABS Take 1 tablet by mouth daily.    . Pitavastatin Calcium (LIVALO) 2 MG TABS Take 2 mg by mouth daily.     No current facility-administered medications for this visit.    Patient confirms/reports the following allergies:  Allergies  Allergen Reactions  . Codeine Nausea Only  . Hydrocodone-Acetaminophen Other (See Comments)    "Puts me up the wall"  . No Known  Allergies     Note: Muscle aches  . Oxycodone-Acetaminophen     Hallucination  . Prednisolone     Hallucination    No orders of the defined types were placed in this encounter.   AUTHORIZATION INFORMATION Primary Insurance: 1D#: Group #:  Secondary Insurance: 1D#: Group #:  SCHEDULE INFORMATION: Date: 04/15/21 Time: Location:[facility]

## 2021-03-29 DIAGNOSIS — E785 Hyperlipidemia, unspecified: Secondary | ICD-10-CM | POA: Diagnosis not present

## 2021-03-29 DIAGNOSIS — E1165 Type 2 diabetes mellitus with hyperglycemia: Secondary | ICD-10-CM | POA: Diagnosis not present

## 2021-03-29 DIAGNOSIS — G629 Polyneuropathy, unspecified: Secondary | ICD-10-CM | POA: Diagnosis not present

## 2021-03-29 DIAGNOSIS — M2550 Pain in unspecified joint: Secondary | ICD-10-CM | POA: Diagnosis not present

## 2021-03-29 DIAGNOSIS — I10 Essential (primary) hypertension: Secondary | ICD-10-CM | POA: Diagnosis not present

## 2021-03-29 DIAGNOSIS — D510 Vitamin B12 deficiency anemia due to intrinsic factor deficiency: Secondary | ICD-10-CM | POA: Diagnosis not present

## 2021-03-29 DIAGNOSIS — E559 Vitamin D deficiency, unspecified: Secondary | ICD-10-CM | POA: Diagnosis not present

## 2021-03-29 DIAGNOSIS — E6609 Other obesity due to excess calories: Secondary | ICD-10-CM | POA: Diagnosis not present

## 2021-03-29 DIAGNOSIS — F411 Generalized anxiety disorder: Secondary | ICD-10-CM | POA: Diagnosis not present

## 2021-03-29 DIAGNOSIS — E118 Type 2 diabetes mellitus with unspecified complications: Secondary | ICD-10-CM | POA: Diagnosis not present

## 2021-03-29 DIAGNOSIS — E782 Mixed hyperlipidemia: Secondary | ICD-10-CM | POA: Diagnosis not present

## 2021-03-29 DIAGNOSIS — E039 Hypothyroidism, unspecified: Secondary | ICD-10-CM | POA: Diagnosis not present

## 2021-04-06 ENCOUNTER — Telehealth: Payer: Self-pay

## 2021-04-06 NOTE — Telephone Encounter (Signed)
Patient lvm regarding COVID testing.  Returned patients call, lvm  advising her that COVID testing is no longer required prior to her colonoscopy procedure.  Thanks,  [place], [place]

## 2021-04-07 ENCOUNTER — Encounter: Payer: Self-pay | Admitting: Gastroenterology

## 2021-04-07 ENCOUNTER — Other Ambulatory Visit: Payer: Self-pay

## 2021-04-14 NOTE — Discharge Instructions (Signed)

## 2021-04-15 ENCOUNTER — Encounter: Payer: Self-pay | Admitting: Gastroenterology

## 2021-04-15 ENCOUNTER — Ambulatory Visit: Payer: Medicare Other | Admitting: Anesthesiology

## 2021-04-15 ENCOUNTER — Other Ambulatory Visit: Payer: Self-pay

## 2021-04-15 ENCOUNTER — Ambulatory Visit
Admission: RE | Admit: 2021-04-15 | Discharge: 2021-04-15 | Disposition: A | Discharge status: Home / Self Care | Payer: Medicare Other | Attending: Gastroenterology | Admitting: Gastroenterology

## 2021-04-15 ENCOUNTER — Encounter
Admission: RE | Disposition: A | Discharge status: Home / Self Care | Payer: Self-pay | Source: Home / Self Care | Attending: Gastroenterology

## 2021-04-15 DIAGNOSIS — Z7982 Long term (current) use of aspirin: Secondary | ICD-10-CM | POA: Insufficient documentation

## 2021-04-15 DIAGNOSIS — Z886 Allergy status to analgesic agent status: Secondary | ICD-10-CM | POA: Diagnosis not present

## 2021-04-15 DIAGNOSIS — K640 First degree hemorrhoids: Secondary | ICD-10-CM | POA: Insufficient documentation

## 2021-04-15 DIAGNOSIS — Z87891 Personal history of nicotine dependence: Secondary | ICD-10-CM | POA: Diagnosis not present

## 2021-04-15 DIAGNOSIS — Z860100 Personal history of colon polyps, unspecified: Secondary | ICD-10-CM

## 2021-04-15 DIAGNOSIS — Z803 Family history of malignant neoplasm of breast: Secondary | ICD-10-CM | POA: Diagnosis not present

## 2021-04-15 DIAGNOSIS — Z7984 Long term (current) use of oral hypoglycemic drugs: Secondary | ICD-10-CM | POA: Insufficient documentation

## 2021-04-15 DIAGNOSIS — Z833 Family history of diabetes mellitus: Secondary | ICD-10-CM | POA: Diagnosis not present

## 2021-04-15 DIAGNOSIS — Z8249 Family history of ischemic heart disease and other diseases of the circulatory system: Secondary | ICD-10-CM | POA: Insufficient documentation

## 2021-04-15 DIAGNOSIS — Z1211 Encounter for screening for malignant neoplasm of colon: Secondary | ICD-10-CM | POA: Insufficient documentation

## 2021-04-15 DIAGNOSIS — I10 Essential (primary) hypertension: Secondary | ICD-10-CM | POA: Diagnosis not present

## 2021-04-15 DIAGNOSIS — Z809 Family history of malignant neoplasm, unspecified: Secondary | ICD-10-CM | POA: Insufficient documentation

## 2021-04-15 DIAGNOSIS — Z885 Allergy status to narcotic agent status: Secondary | ICD-10-CM | POA: Insufficient documentation

## 2021-04-15 DIAGNOSIS — Z79899 Other long term (current) drug therapy: Secondary | ICD-10-CM | POA: Insufficient documentation

## 2021-04-15 DIAGNOSIS — K5730 Diverticulosis of large intestine without perforation or abscess without bleeding: Secondary | ICD-10-CM | POA: Diagnosis not present

## 2021-04-15 DIAGNOSIS — E119 Type 2 diabetes mellitus without complications: Secondary | ICD-10-CM | POA: Diagnosis not present

## 2021-04-15 HISTORY — DX: Unspecified osteoarthritis, unspecified site: M19.90

## 2021-04-15 HISTORY — PX: COLONOSCOPY WITH PROPOFOL: SHX5780

## 2021-04-15 LAB — GLUCOSE, CAPILLARY
Glucose-Capillary: 136 mg/dL — ABNORMAL HIGH (ref 70–99)
Glucose-Capillary: 123 mg/dL — ABNORMAL HIGH (ref 70–99)

## 2021-04-15 SURGERY — COLONOSCOPY WITH PROPOFOL
Anesthesia: General | Site: Rectum

## 2021-04-15 MED ORDER — ACETAMINOPHEN 325 MG PO TABS: 325.0000 mg | ORAL_TABLET | Freq: Once | ORAL | Status: DC

## 2021-04-15 MED ORDER — SODIUM CHLORIDE 0.9 % IV SOLN: INTRAVENOUS | Status: DC

## 2021-04-15 MED ORDER — LIDOCAINE HCL (CARDIAC) PF 100 MG/5ML IV SOSY
PREFILLED_SYRINGE | INTRAVENOUS | Status: DC | PRN
  Administered 2021-04-15: 40 mg via INTRAVENOUS

## 2021-04-15 MED ORDER — ACETAMINOPHEN 160 MG/5ML PO SOLN: 325.0000 mg | Freq: Once | ORAL | Status: DC

## 2021-04-15 MED ORDER — STERILE WATER FOR IRRIGATION IR SOLN
Status: DC | PRN
  Administered 2021-04-15: .05 mL

## 2021-04-15 MED ORDER — PROPOFOL 10 MG/ML IV BOLUS
INTRAVENOUS | Status: DC | PRN
  Administered 2021-04-15 (×2): 20 mg via INTRAVENOUS
  Administered 2021-04-15: 50 mg via INTRAVENOUS
  Administered 2021-04-15 (×2): 20 mg via INTRAVENOUS

## 2021-04-15 MED ORDER — LACTATED RINGERS IV SOLN: INTRAVENOUS | Status: DC

## 2021-04-15 SURGICAL SUPPLY — 6 items
GOWN CVR UNV OPN BCK APRN NK (MISCELLANEOUS) ×2 IMPLANT
GOWN ISOL THUMB LOOP REG UNIV (MISCELLANEOUS) ×4
KIT PRC NS LF DISP ENDO (KITS) ×1 IMPLANT
KIT PROCEDURE OLYMPUS (KITS) ×2
MANIFOLD NEPTUNE II (INSTRUMENTS) ×2 IMPLANT
WATER STERILE IRR 250ML POUR (IV SOLUTION) ×2 IMPLANT

## 2021-04-15 NOTE — Anesthesia Preprocedure Evaluation (Signed)
Anesthesia Evaluation  Patient identified by MRN, date of birth, ID band Patient awake    Reviewed: Allergy & Precautions, H&P , NPO status , Patient's Chart, lab work & pertinent test results  Airway Mallampati: II  TM Distance: >3 FB Neck ROM: full    Dental  (+) Upper Dentures, Lower Dentures   Pulmonary sleep apnea and Continuous Positive Airway Pressure Ventilation , former smoker,    Pulmonary exam normal breath sounds clear to auscultation       Cardiovascular hypertension, Normal cardiovascular exam Rhythm:regular Rate:Normal     Neuro/Psych PSYCHIATRIC DISORDERS    GI/Hepatic GERD  ,  Endo/Other  diabetes, Type 2Morbid obesity  Renal/GU Renal disease     Musculoskeletal   Abdominal   Peds  Hematology   Anesthesia Other Findings   Reproductive/Obstetrics                             Anesthesia Physical Anesthesia Plan  ASA: III  Anesthesia Plan: General   Post-op Pain Management:    Induction: Intravenous  PONV Risk Score and Plan: 3 and Treatment may vary due to age or medical condition, Propofol infusion and TIVA  Airway Management Planned: Natural Airway  Additional Equipment:   Intra-op Plan:   Post-operative Plan:   Informed Consent: I have reviewed the patients History and Physical, chart, labs and discussed the procedure including the risks, benefits and alternatives for the proposed anesthesia with the patient or authorized representative who has indicated his/her understanding and acceptance.     Dental Advisory Given  Plan Discussed with: CRNA  Anesthesia Plan Comments:         Anesthesia Quick Evaluation

## 2021-04-15 NOTE — Op Note (Signed)
[facility] Gastroenterology Patient Name: Taylor Barry Procedure Date: 04/15/2021 8:09 AM MRN: [id number] Account #: [id number] Date of Birth: 10-07-1946 Admit Type: Outpatient Age: 75 Room: [facility] OR ROOM 01 Gender: Female Note Status: Finalized Procedure:             Colonoscopy Indications:           High risk colon cancer surveillance: Personal history                         of colonic polyps Providers:             Lucilla Lame MD, MD Referring MD:          Perrin Maltese, MD (Referring MD) Medicines:             Propofol per Anesthesia Complications:         No immediate complications. Procedure:             Pre-Anesthesia Assessment:                        - Prior to the procedure, a History and Physical was                         performed, and patient medications and allergies were                         reviewed. The patient's tolerance of previous                         anesthesia was also reviewed. The risks and benefits                         of the procedure and the sedation options and risks                         were discussed with the patient. All questions were                         answered, and informed consent was obtained. Prior                         Anticoagulants: The patient has taken no previous                         anticoagulant or antiplatelet agents. ASA Grade                         Assessment: II - A patient with mild systemic disease.                         After reviewing the risks and benefits, the patient                         was deemed in satisfactory condition to undergo the                         procedure.  After obtaining informed consent, the colonoscope was                         passed under direct vision. Throughout the procedure,                         the patient's blood pressure, pulse, and oxygen                         saturations were monitored continuously. The                          Colonoscope was introduced through the anus and                         advanced to the the cecum, identified by appendiceal                         orifice and ileocecal valve. The colonoscopy was                         performed without difficulty. The patient tolerated                         the procedure well. The quality of the bowel                         preparation was excellent. Findings:      The perianal and digital rectal examinations were normal.      Non-bleeding internal hemorrhoids were found during retroflexion. The       hemorrhoids were Grade I (internal hemorrhoids that do not prolapse).      Many small-mouthed diverticula were found in the sigmoid colon. Impression:            - Non-bleeding internal hemorrhoids.                        - Diverticulosis in the sigmoid colon.                        - No specimens collected. Recommendation:        - Discharge patient to home.                        - Resume previous diet.                        - Continue present medications.                        - Repeat colonoscopy is not recommended due to current                         age (37 years or older) for surveillance. Procedure Code(s):     --- Professional ---                        [phone], Colonoscopy, flexible; diagnostic, including  collection of specimen(s) by brushing or washing, when                         performed (separate procedure) Diagnosis Code(s):     --- Professional ---                        Z86.010, Personal history of colonic polyps CPT copyright 2019 American Medical Association. All rights reserved. The codes documented in this report are preliminary and upon coder review may  be revised to meet current compliance requirements. Lucilla Lame MD, MD 04/15/2021 8:35:46 AM This report has been signed electronically. Number of Addenda: 0 Note Initiated On: 04/15/2021 8:09 AM Scope Withdrawal Time: 0 hours 9 minutes 17  seconds  Total Procedure Duration: 0 hours 15 minutes 59 seconds  Estimated Blood Loss:  Estimated blood loss: none.      [facility]

## 2021-04-15 NOTE — Transfer of Care (Signed)
Immediate Anesthesia Transfer of Care Note  Patient: Taylor Barry  Procedure(s) Performed: COLONOSCOPY WITH PROPOFOL (N/A Rectum)  Patient Location: PACU  Anesthesia Type: General  Level of Consciousness: awake, alert  and patient cooperative  Airway and Oxygen Therapy: Patient Spontanous Breathing and Patient connected to supplemental oxygen  Post-op Assessment: Post-op Vital signs reviewed, Patient's Cardiovascular Status Stable, Respiratory Function Stable, Patent Airway and No signs of Nausea or vomiting  Post-op Vital Signs: Reviewed and stable  Complications: No complications documented.

## 2021-04-15 NOTE — Anesthesia Procedure Notes (Signed)
Procedure Name: MAC Date/Time: 04/15/2021 8:12 AM Performed by: Silvana Newness, CRNA Pre-anesthesia Checklist: Patient identified, Emergency Drugs available, Suction available, Patient being monitored and Timeout performed Patient Re-evaluated:Patient Re-evaluated prior to induction Oxygen Delivery Method: Nasal cannula Placement Confirmation: positive ETCO2

## 2021-04-15 NOTE — H&P (Signed)
Taylor Lame, MD [address] [place], [place] [address] [phone] Fax : [phone]  Primary Care Physician:  Taylor Maltese, MD Primary Gastroenterologist:  Dr. Allen Barry  Pre-Procedure History & Physical: HPI:  Taylor Barry is a 75 y.o. female is here for an colonoscopy.   Past Medical History:  Diagnosis Date  . Allergic rhinitis with postnasal drip   . Anxiety and depression   . Arthritis   . Arthritis, multiple joint involvement   . Bronchitis, acute, with bronchospasm   . Hair loss   . HTN, goal below 150/90   . Hyperlipidemia LDL goal <100   . Obstructive sleep apnea, adult    CPAP  . Right adrenal mass ([place])   . Vitamin D deficiency     Past Surgical History:  Procedure Laterality Date  . ABDOMINAL HYSTERECTOMY  1968   total  . REPLACEMENT TOTAL KNEE Bilateral    Right knee first, Left knee more recent  . TYMPANOPLASTY Right 1977    Prior to Admission medications   Medication Sig Start Date End Date Taking? Authorizing Provider  aspirin 81 MG chewable tablet Chew by mouth.   Yes [provider]  diazepam (VALIUM) 10 MG tablet Take 10 mg by mouth daily as needed. 03/18/21  Yes [provider]  ibuprofen (ADVIL) 800 MG tablet SMARTSIG:1 Tablet(s) By Mouth 1 to 3 Times Daily PRN 01/16/21  Yes [provider]  losartan-hydrochlorothiazide (HYZAAR) 100-25 MG tablet Take 1 tablet by mouth daily. 02/03/21  Yes [provider]  metFORMIN (GLUCOPHAGE-XR) 500 MG 24 hr tablet Take 500 mg by mouth 2 (two) times daily. 07/27/20  Yes [provider]  Multiple Vitamins-Minerals (MULTIVITAMIN WITH MINERALS) tablet Take 1 tablet by mouth daily.   Yes [provider]  NEXLETOL 180 MG TABS Take 1 tablet by mouth daily. 07/29/20  Yes [provider]  ondansetron (ZOFRAN-ODT) 4 MG disintegrating tablet Take 4 mg by mouth every 8 (eight) hours as needed. Patient not taking: Reported on 03/24/2021 11/12/20    [provider]  [facility] VERIO test strip 3 (three) times daily. 02/18/21   [provider]    Allergies as of 03/24/2021 - Review Complete 03/24/2021  Allergen Reaction Noted  . Acetaminophen  03/24/2021  . Codeine Nausea Only 07/28/2015  . Hydrocodone-acetaminophen Other (See Comments) 07/10/2018  . Oxycodone hcl  03/24/2021  . Oxycodone-acetaminophen  07/28/2015  . Prednisolone  07/28/2015    Family History  Problem Relation Age of Onset  . Heart disease Mother   . Alcohol abuse Father   . Diabetes Daughter   . Breast cancer Maternal Aunt   . Cancer Brother   . Cancer Maternal Grandmother   . Cancer Maternal Grandfather     Social History   Socioeconomic History  . Marital status: Divorced    Spouse name: Not on file  . Number of children: Not on file  . Years of education: Not on file  . Highest education level: Not on file  Occupational History  . Not on file  Tobacco Use  . Smoking status: Former Smoker    Packs/day: 0.25    Quit date: 01/25/1995    Years since quitting: 26.2  . Smokeless tobacco: Never Used  . Tobacco comment: social smoker as teenager  Vaping Use  . Vaping Use: Never used  Substance and Sexual Activity  . Alcohol use: No    Alcohol/week: 0.0 standard drinks  . Drug use: No  .  Sexual activity: Never  Other Topics Concern  . Not on file  Social History Narrative  . Not on file   Social Determinants of Health   Financial Resource Strain: Not on file  Food Insecurity: Not on file  Transportation Needs: Not on file  Physical Activity: Not on file  Stress: Not on file  Social Connections: Not on file  Intimate Partner Violence: Not on file    Review of Systems: See HPI, otherwise negative ROS  Physical Exam: BP (!) 162/71   Pulse 79   Temp (!) 96.7 F (35.9 C) (Temporal)   Ht 5\' 6"  (1.676 m)   Wt 109.8 kg   SpO2 98%   BMI 39.06 kg/m  General:   Alert,  pleasant and cooperative in NAD Head:   Normocephalic and atraumatic. Neck:  Supple; no masses or thyromegaly. Lungs:  Clear throughout to auscultation.    Heart:  Regular rate and rhythm. Abdomen:  Soft, nontender and nondistended. Normal bowel sounds, without guarding, and without rebound.   Neurologic:  Alert and  oriented x4;  grossly normal neurologically.  Impression/Plan: Taylor Barry is here for an colonoscopy to be performed for a history of adenomatous polyps on 02/2015   Risks, benefits, limitations, and alternatives regarding  colonoscopy have been reviewed with the patient.  Questions have been answered.  All parties agreeable.   Taylor Lame, MD  04/15/2021, 8:05 AM

## 2021-04-15 NOTE — Anesthesia Postprocedure Evaluation (Signed)
Anesthesia Post Note  Patient: Taylor Barry  Procedure(s) Performed: COLONOSCOPY WITH PROPOFOL (N/A Rectum)     Patient location during evaluation: PACU Anesthesia Type: General Level of consciousness: awake and alert and oriented Pain management: satisfactory to patient Vital Signs Assessment: post-procedure vital signs reviewed and stable Respiratory status: spontaneous breathing, nonlabored ventilation and respiratory function stable Cardiovascular status: blood pressure returned to baseline and stable Postop Assessment: Adequate PO intake and No signs of nausea or vomiting Anesthetic complications: no   No complications documented.  Raliegh Ip

## 2021-04-20 DIAGNOSIS — R799 Abnormal finding of blood chemistry, unspecified: Secondary | ICD-10-CM | POA: Diagnosis not present

## 2021-04-21 DIAGNOSIS — F411 Generalized anxiety disorder: Secondary | ICD-10-CM | POA: Diagnosis not present

## 2021-04-21 DIAGNOSIS — I10 Essential (primary) hypertension: Secondary | ICD-10-CM | POA: Diagnosis not present

## 2021-04-21 DIAGNOSIS — G2581 Restless legs syndrome: Secondary | ICD-10-CM | POA: Diagnosis not present

## 2021-04-21 DIAGNOSIS — E782 Mixed hyperlipidemia: Secondary | ICD-10-CM | POA: Diagnosis not present

## 2021-04-21 DIAGNOSIS — E785 Hyperlipidemia, unspecified: Secondary | ICD-10-CM | POA: Diagnosis not present

## 2021-04-21 DIAGNOSIS — R252 Cramp and spasm: Secondary | ICD-10-CM | POA: Diagnosis not present

## 2021-04-21 DIAGNOSIS — E119 Type 2 diabetes mellitus without complications: Secondary | ICD-10-CM | POA: Diagnosis not present

## 2021-04-21 DIAGNOSIS — F32A Depression, unspecified: Secondary | ICD-10-CM | POA: Diagnosis not present

## 2021-04-21 DIAGNOSIS — E1165 Type 2 diabetes mellitus with hyperglycemia: Secondary | ICD-10-CM | POA: Diagnosis not present

## 2021-04-21 DIAGNOSIS — G4733 Obstructive sleep apnea (adult) (pediatric): Secondary | ICD-10-CM | POA: Diagnosis not present

## 2021-07-06 DIAGNOSIS — E1165 Type 2 diabetes mellitus with hyperglycemia: Secondary | ICD-10-CM | POA: Diagnosis not present

## 2021-07-06 DIAGNOSIS — F411 Generalized anxiety disorder: Secondary | ICD-10-CM | POA: Diagnosis not present

## 2021-07-06 DIAGNOSIS — K219 Gastro-esophageal reflux disease without esophagitis: Secondary | ICD-10-CM | POA: Diagnosis not present

## 2021-07-06 DIAGNOSIS — I10 Essential (primary) hypertension: Secondary | ICD-10-CM | POA: Diagnosis not present

## 2021-07-06 DIAGNOSIS — R252 Cramp and spasm: Secondary | ICD-10-CM | POA: Diagnosis not present

## 2021-07-06 DIAGNOSIS — G629 Polyneuropathy, unspecified: Secondary | ICD-10-CM | POA: Diagnosis not present

## 2021-07-06 DIAGNOSIS — G4733 Obstructive sleep apnea (adult) (pediatric): Secondary | ICD-10-CM | POA: Diagnosis not present

## 2021-07-06 DIAGNOSIS — E785 Hyperlipidemia, unspecified: Secondary | ICD-10-CM | POA: Diagnosis not present

## 2021-07-06 DIAGNOSIS — M5137 Other intervertebral disc degeneration, lumbosacral region: Secondary | ICD-10-CM | POA: Diagnosis not present

## 2021-07-06 DIAGNOSIS — M5135 Other intervertebral disc degeneration, thoracolumbar region: Secondary | ICD-10-CM | POA: Diagnosis not present

## 2021-10-13 DIAGNOSIS — F411 Generalized anxiety disorder: Secondary | ICD-10-CM | POA: Diagnosis not present

## 2021-10-13 DIAGNOSIS — I10 Essential (primary) hypertension: Secondary | ICD-10-CM | POA: Diagnosis not present

## 2021-10-13 DIAGNOSIS — E1165 Type 2 diabetes mellitus with hyperglycemia: Secondary | ICD-10-CM | POA: Diagnosis not present

## 2021-10-13 DIAGNOSIS — E782 Mixed hyperlipidemia: Secondary | ICD-10-CM | POA: Diagnosis not present

## 2021-10-13 DIAGNOSIS — R252 Cramp and spasm: Secondary | ICD-10-CM | POA: Diagnosis not present

## 2021-10-20 DIAGNOSIS — D510 Vitamin B12 deficiency anemia due to intrinsic factor deficiency: Secondary | ICD-10-CM | POA: Diagnosis not present

## 2021-10-20 DIAGNOSIS — E785 Hyperlipidemia, unspecified: Secondary | ICD-10-CM | POA: Diagnosis not present

## 2021-10-20 DIAGNOSIS — E1165 Type 2 diabetes mellitus with hyperglycemia: Secondary | ICD-10-CM | POA: Diagnosis not present

## 2021-10-20 DIAGNOSIS — I10 Essential (primary) hypertension: Secondary | ICD-10-CM | POA: Diagnosis not present

## 2022-02-15 DIAGNOSIS — E1165 Type 2 diabetes mellitus with hyperglycemia: Secondary | ICD-10-CM | POA: Diagnosis not present

## 2022-02-15 DIAGNOSIS — G4733 Obstructive sleep apnea (adult) (pediatric): Secondary | ICD-10-CM | POA: Diagnosis not present

## 2022-02-15 DIAGNOSIS — I10 Essential (primary) hypertension: Secondary | ICD-10-CM | POA: Diagnosis not present

## 2022-02-15 DIAGNOSIS — E782 Mixed hyperlipidemia: Secondary | ICD-10-CM | POA: Diagnosis not present

## 2022-02-15 DIAGNOSIS — F411 Generalized anxiety disorder: Secondary | ICD-10-CM | POA: Diagnosis not present

## 2022-02-15 DIAGNOSIS — E785 Hyperlipidemia, unspecified: Secondary | ICD-10-CM | POA: Diagnosis not present

## 2022-03-20 ENCOUNTER — Other Ambulatory Visit: Payer: Self-pay | Admitting: Internal Medicine

## 2022-03-20 ENCOUNTER — Other Ambulatory Visit: Payer: Self-pay | Admitting: Family

## 2022-03-20 DIAGNOSIS — Z1231 Encounter for screening mammogram for malignant neoplasm of breast: Secondary | ICD-10-CM

## 2022-04-13 DIAGNOSIS — Z20822 Contact with and (suspected) exposure to covid-19: Secondary | ICD-10-CM | POA: Diagnosis not present

## 2022-04-18 DIAGNOSIS — I83891 Varicose veins of right lower extremities with other complications: Secondary | ICD-10-CM | POA: Diagnosis not present

## 2022-04-18 DIAGNOSIS — M5416 Radiculopathy, lumbar region: Secondary | ICD-10-CM | POA: Diagnosis not present

## 2022-04-18 DIAGNOSIS — E1142 Type 2 diabetes mellitus with diabetic polyneuropathy: Secondary | ICD-10-CM | POA: Diagnosis not present

## 2022-04-30 DIAGNOSIS — Z20822 Contact with and (suspected) exposure to covid-19: Secondary | ICD-10-CM | POA: Diagnosis not present

## 2022-05-02 ENCOUNTER — Inpatient Hospital Stay: Admission: RE | Admit: 2022-05-02 | Payer: Medicare Other | Source: Ambulatory Visit

## 2022-05-03 DIAGNOSIS — Z20822 Contact with and (suspected) exposure to covid-19: Secondary | ICD-10-CM | POA: Diagnosis not present

## 2022-05-30 ENCOUNTER — Ambulatory Visit
Admission: RE | Admit: 2022-05-30 | Discharge: 2022-05-30 | Disposition: A | Discharge status: Home / Self Care | Payer: Medicare Other | Source: Ambulatory Visit | Attending: Family | Admitting: Family

## 2022-05-30 DIAGNOSIS — Z1231 Encounter for screening mammogram for malignant neoplasm of breast: Secondary | ICD-10-CM | POA: Insufficient documentation

## 2022-06-06 DIAGNOSIS — E782 Mixed hyperlipidemia: Secondary | ICD-10-CM | POA: Diagnosis not present

## 2022-06-06 DIAGNOSIS — K219 Gastro-esophageal reflux disease without esophagitis: Secondary | ICD-10-CM | POA: Diagnosis not present

## 2022-06-06 DIAGNOSIS — E1165 Type 2 diabetes mellitus with hyperglycemia: Secondary | ICD-10-CM | POA: Diagnosis not present

## 2022-06-06 DIAGNOSIS — R5383 Other fatigue: Secondary | ICD-10-CM | POA: Diagnosis not present

## 2022-06-06 DIAGNOSIS — R110 Nausea: Secondary | ICD-10-CM | POA: Diagnosis not present

## 2022-06-06 DIAGNOSIS — E559 Vitamin D deficiency, unspecified: Secondary | ICD-10-CM | POA: Diagnosis not present

## 2022-06-06 DIAGNOSIS — E785 Hyperlipidemia, unspecified: Secondary | ICD-10-CM | POA: Diagnosis not present

## 2022-06-06 DIAGNOSIS — M791 Myalgia, unspecified site: Secondary | ICD-10-CM | POA: Diagnosis not present

## 2022-06-06 DIAGNOSIS — I10 Essential (primary) hypertension: Secondary | ICD-10-CM | POA: Diagnosis not present

## 2022-06-06 DIAGNOSIS — D519 Vitamin B12 deficiency anemia, unspecified: Secondary | ICD-10-CM | POA: Diagnosis not present

## 2022-07-04 DIAGNOSIS — E1165 Type 2 diabetes mellitus with hyperglycemia: Secondary | ICD-10-CM | POA: Diagnosis not present

## 2022-07-04 DIAGNOSIS — I10 Essential (primary) hypertension: Secondary | ICD-10-CM | POA: Diagnosis not present

## 2022-07-04 DIAGNOSIS — M2550 Pain in unspecified joint: Secondary | ICD-10-CM | POA: Diagnosis not present

## 2022-07-04 DIAGNOSIS — E782 Mixed hyperlipidemia: Secondary | ICD-10-CM | POA: Diagnosis not present

## 2022-07-26 ENCOUNTER — Ambulatory Visit (INDEPENDENT_AMBULATORY_CARE_PROVIDER_SITE_OTHER): Payer: Medicare Other | Admitting: Dermatology

## 2022-07-26 DIAGNOSIS — L2489 Irritant contact dermatitis due to other agents: Secondary | ICD-10-CM | POA: Diagnosis not present

## 2022-07-26 DIAGNOSIS — L219 Seborrheic dermatitis, unspecified: Secondary | ICD-10-CM | POA: Diagnosis not present

## 2022-07-26 DIAGNOSIS — D1800 Hemangioma unspecified site: Secondary | ICD-10-CM

## 2022-07-26 DIAGNOSIS — L820 Inflamed seborrheic keratosis: Secondary | ICD-10-CM

## 2022-07-26 DIAGNOSIS — L249 Irritant contact dermatitis, unspecified cause: Secondary | ICD-10-CM

## 2022-07-26 MED ORDER — FLUOCINONIDE 0.05 % EX SOLN: CUTANEOUS | 2 refills | Status: DC

## 2022-07-26 MED ORDER — KETOCONAZOLE 2 % EX SHAM: MEDICATED_SHAMPOO | CUTANEOUS | 3 refills | Status: DC

## 2022-07-26 NOTE — Progress Notes (Signed)
Follow-Up Visit   Subjective  Taylor Barry is a 76 y.o. female who presents for the following: Itchy scalp (X 1 year, worsened when she had Covid last year. She used T-Sal in the past, but didn't really help. She is currently using Tea Tree Shampoo, 100% pure tea tree oil, Vit C peppermint, Lavender, rosemary oil.) and growths (Intermammary, back/bra area, neck. Irritated by clothing and CPAP mask.).   The following portions of the chart were reviewed this encounter and updated as appropriate:       Review of Systems:  No other skin or systemic complaints except as noted in HPI or Assessment and Plan.  Objective  Well appearing patient in no apparent distress; mood and affect are within normal limits.  A focused examination was performed including face, scalp, trunk. Relevant physical exam findings are noted in the Assessment and Plan.  Neck Mild erythema and scale of the neck  L flank x 4, R breast x 1, R flank x 3 (8) Erythematous stuck-on, waxy papule   Scalp Scaly patches of the scalp; diffuse scaling of the occipital and parietal scalp    Assessment & Plan  Hemangiomas - Red papules - Discussed benign nature - Observe - Call for any changes  Irritant dermatitis Neck  Secondary to CPAP strap  Recommend OTC Gold Bond Rapid Relief Anti-Itch cream (pramoxine + menthol), CeraVe Anti-itch cream or lotion (pramoxine), Sarna lotion (Original- menthol + camphor or Sensitive- pramoxine) or Eucerin 12 hour Itch Relief lotion (menthol) up to 3 times per day to areas on body that are itchy.  May try Vaseline as a barrier ointment prior to putting on strap    Inflamed seborrheic keratosis (8) L flank x 4, R breast x 1, R flank x 3  Symptomatic, irritating, patient would like treated.  Destruction of lesion - L flank x 4, R breast x 1, R flank x 3  Destruction method: cryotherapy   Informed consent: discussed and consent obtained   Lesion destroyed using liquid  nitrogen: Yes   Region frozen until ice ball extended beyond lesion: Yes   Outcome: patient tolerated procedure well with no complications   Post-procedure details: wound care instructions given   Additional details:  Prior to procedure, discussed risks of blister formation, small wound, skin dyspigmentation, or rare scar following cryotherapy. Recommend Vaseline ointment to treated areas while healing.   Seborrheic dermatitis Scalp  Chronic and persistent condition with duration or expected duration over one year. Condition is bothersome/symptomatic for patient. Currently flared.   Seborrheic Dermatitis  -  is a chronic persistent rash characterized by pinkness and scaling most commonly of the mid face but also can occur on the scalp (dandruff), ears; mid chest, mid back and groin.  It tends to be exacerbated by stress and cooler weather.  People who have neurologic disease may experience new onset or exacerbation of existing seborrheic dermatitis.  The condition is not curable but treatable and can be controlled.  Start ketoconazole 2% shampoo - massage into scalp every 3 days, let sit at least 5 minutes and rinse dsp 128m 3Rf.  Start Lidex Scalp Solution Apply 1-2 drops to AA scalp QD/BID prn itch dsp 622m2Rf.  Discussed starting DermaSmoothe Oil, pt defers at this time  ketoconazole (NIZORAL) 2 % shampoo - Scalp Massage into scalp 3 times a week, let sit 5 minutes, then rinse.  fluocinonide (LIDEX) 0.05 % external solution - Scalp Apply 1-2 drops to affected areas scalp once to  twice daily as needed for itch. Avoid face.   Return if symptoms worsen or fail to improve.  IJamesetta Orleans, CMA, am acting as scribe for Brendolyn Patty, MD .  Documentation: I have reviewed the above documentation for accuracy and completeness, and I agree with the above.  Brendolyn Patty MD

## 2022-07-26 NOTE — Patient Instructions (Addendum)
Cryotherapy Aftercare  Wash gently with soap and water everyday.   Apply Vaseline and Band-Aid daily until healed.    Recommend OTC Gold Bond Rapid Relief Anti-Itch cream (pramoxine + menthol), CeraVe Anti-itch cream or lotion (pramoxine), Sarna lotion (Original- menthol + camphor or Sensitive- pramoxine) or Eucerin 12 hour Itch Relief lotion (menthol) up to 3 times per day to areas on body that are itchy.   Due to recent changes in healthcare laws, you may see results of your pathology and/or laboratory studies on MyChart before the doctors have had a chance to review them. We understand that in some cases there may be results that are confusing or concerning to you. Please understand that not all results are received at the same time and often the doctors may need to interpret multiple results in order to provide you with the best plan of care or course of treatment. Therefore, we ask that you please give [place] 2 business days to thoroughly review all your results before contacting the office for clarification. Should we see a critical lab result, you will be contacted sooner.   If You Need Anything After Your Visit  If you have any questions or concerns for your doctor, please call our main line at [phone] and press option 4 to reach your doctor's medical assistant. If no one answers, please leave a voicemail as directed and we will return your call as soon as possible. Messages left after 4 pm will be answered the following business day.   You may also send [place] a message via [place]. We typically respond to MyChart messages within 1-2 business days.  For prescription refills, please ask your pharmacy to contact our office. Our fax number is [phone].  If you have an urgent issue when the clinic is closed that cannot wait until the next business day, you can page your doctor at the number below.    Please note that while we do our best to be available for urgent issues outside of office  hours, we are not available 24/7.   If you have an urgent issue and are unable to reach [place], you may choose to seek medical care at your doctor's office, retail clinic, urgent care center, or emergency room.  If you have a medical emergency, please immediately call 911 or go to the emergency department.  Pager Numbers  - Dr. Nehemiah Massed: [phone]  - Dr. Laurence Ferrari: [phone]  - Dr. Nicole Kindred: [phone]  In the event of inclement weather, please call our main line at [phone] for an update on the status of any delays or closures.  Dermatology Medication Tips: Please keep the boxes that topical medications come in in order to help keep track of the instructions about where and how to use these. Pharmacies typically print the medication instructions only on the boxes and not directly on the medication tubes.   If your medication is too expensive, please contact our office at [phone] option 4 or send [place] a message through [place].   We are unable to tell what your co-pay for medications will be in advance as this is different depending on your insurance coverage. However, we may be able to find a substitute medication at lower cost or fill out paperwork to get insurance to cover a needed medication.   If a prior authorization is required to get your medication covered by your insurance company, please allow [place] 1-2 business days to complete this process.  Drug prices often vary depending on where the  prescription is filled and some pharmacies may offer cheaper prices.  The website www.goodrx.com contains coupons for medications through different pharmacies. The prices here do not account for what the cost may be with help from insurance (it may be cheaper with your insurance), but the website can give you the [place] if you did not use any insurance.  - You can print the associated coupon and take it with your prescription to the pharmacy.  - You may also stop by our office during regular  business hours and pick up a GoodRx coupon card.  - If you need your prescription sent electronically to a different pharmacy, notify our office through [facility] or by phone at [phone] option 4.     Si Usted Necesita Algo Despus de Su Visita  Tambin puede enviarnos un mensaje a travs de Pharmacist, community. Por lo general respondemos a los mensajes de MyChart en el transcurso de 1 a 2 das hbiles.  Para renovar recetas, por favor pida a su farmacia que se ponga en contacto con nuestra oficina. Harland Dingwall de fax es [place] [phone].  Si tiene un asunto urgente cuando la clnica est cerrada y que no puede esperar hasta el siguiente da hbil, puede llamar/localizar a su doctor(a) al nmero que aparece a continuacin.   Por favor, tenga en cuenta que aunque hacemos todo lo posible para estar disponibles para asuntos urgentes fuera del horario de [place], no estamos disponibles las 24 horas del da, los 7 das de la [place].   Si tiene un problema urgente y no puede comunicarse con nosotros, puede optar por buscar atencin mdica  en el consultorio de su doctor(a), en una clnica privada, en un centro de atencin urgente o en una sala de emergencias.  Si tiene Engineering geologist, por favor llame inmediatamente al 911 o vaya a la sala de emergencias.  Nmeros de bper  - Dr. Nehemiah Massed: [phone]  - Dra. Moye: [phone]  - Dra. Nicole Kindred: [phone]  En caso de inclemencias del [place], por favor llame a Johnsie Kindred principal al [phone] para una actualizacin sobre el [place] de cualquier retraso o cierre.  Consejos para la medicacin en dermatologa: Por favor, guarde las cajas en las que vienen los medicamentos de uso tpico para ayudarle a seguir las instrucciones sobre dnde y cmo usarlos. Las farmacias generalmente imprimen las instrucciones del medicamento slo en las cajas y no directamente en los tubos del [place].   Si su medicamento es muy caro, por  favor, pngase en contacto con Zigmund Daniel llamando al [phone] y presione la opcin 4 o envenos un mensaje a travs de Pharmacist, community.   No podemos decirle cul ser su copago por los medicamentos por adelantado ya que esto es diferente dependiendo de la cobertura de su seguro. Sin embargo, es posible que podamos encontrar un medicamento sustituto a Electrical engineer un formulario para que el seguro cubra el medicamento que se considera necesario.   Si se requiere una autorizacin previa para que su compaa de seguros [place] su medicamento, por favor permtanos de 1 a 2 das hbiles para completar este proceso.  Los precios de los medicamentos varan con frecuencia dependiendo del Environmental consultant de dnde se surte la receta y alguna farmacias pueden ofrecer precios ms baratos.  El sitio web www.goodrx.com tiene cupones para medicamentos de Airline pilot. Los precios aqu no tienen en cuenta lo que podra costar con la ayuda del seguro (puede ser ms barato con su seguro), pero el sitio web  puede darle el precio si no utiliz [employer].  - Puede imprimir el cupn correspondiente y llevarlo con su receta a la farmacia.  - Tambin puede pasar por nuestra oficina durante el horario de atencin regular y Charity fundraiser una tarjeta de cupones de GoodRx.  - Si necesita que su receta se enve electrnicamente a una farmacia diferente, informe a nuestra oficina a travs de MyChart de [place] o por telfono llamando al [phone] y presione la opcin 4.

## 2022-08-17 DIAGNOSIS — E782 Mixed hyperlipidemia: Secondary | ICD-10-CM | POA: Diagnosis not present

## 2022-08-17 DIAGNOSIS — I10 Essential (primary) hypertension: Secondary | ICD-10-CM | POA: Diagnosis not present

## 2022-08-17 DIAGNOSIS — G43009 Migraine without aura, not intractable, without status migrainosus: Secondary | ICD-10-CM | POA: Diagnosis not present

## 2022-08-17 DIAGNOSIS — E1165 Type 2 diabetes mellitus with hyperglycemia: Secondary | ICD-10-CM | POA: Diagnosis not present

## 2022-09-19 DIAGNOSIS — M5416 Radiculopathy, lumbar region: Secondary | ICD-10-CM | POA: Diagnosis not present

## 2022-10-12 DIAGNOSIS — E668 Other obesity: Secondary | ICD-10-CM | POA: Diagnosis not present

## 2022-10-12 DIAGNOSIS — F411 Generalized anxiety disorder: Secondary | ICD-10-CM | POA: Diagnosis not present

## 2022-10-12 DIAGNOSIS — E1142 Type 2 diabetes mellitus with diabetic polyneuropathy: Secondary | ICD-10-CM | POA: Diagnosis not present

## 2022-10-12 DIAGNOSIS — I10 Essential (primary) hypertension: Secondary | ICD-10-CM | POA: Diagnosis not present

## 2022-10-12 DIAGNOSIS — E782 Mixed hyperlipidemia: Secondary | ICD-10-CM | POA: Diagnosis not present

## 2022-10-17 DIAGNOSIS — D519 Vitamin B12 deficiency anemia, unspecified: Secondary | ICD-10-CM | POA: Diagnosis not present

## 2022-10-17 DIAGNOSIS — E668 Other obesity: Secondary | ICD-10-CM | POA: Diagnosis not present

## 2022-10-17 DIAGNOSIS — E559 Vitamin D deficiency, unspecified: Secondary | ICD-10-CM | POA: Diagnosis not present

## 2022-10-17 DIAGNOSIS — E1142 Type 2 diabetes mellitus with diabetic polyneuropathy: Secondary | ICD-10-CM | POA: Diagnosis not present

## 2022-10-17 DIAGNOSIS — F411 Generalized anxiety disorder: Secondary | ICD-10-CM | POA: Diagnosis not present

## 2022-10-17 DIAGNOSIS — E782 Mixed hyperlipidemia: Secondary | ICD-10-CM | POA: Diagnosis not present

## 2022-10-17 DIAGNOSIS — I10 Essential (primary) hypertension: Secondary | ICD-10-CM | POA: Diagnosis not present

## 2022-10-19 DIAGNOSIS — I10 Essential (primary) hypertension: Secondary | ICD-10-CM | POA: Diagnosis not present

## 2022-10-19 DIAGNOSIS — E1121 Type 2 diabetes mellitus with diabetic nephropathy: Secondary | ICD-10-CM | POA: Diagnosis not present

## 2022-10-19 DIAGNOSIS — G4733 Obstructive sleep apnea (adult) (pediatric): Secondary | ICD-10-CM | POA: Diagnosis not present

## 2022-10-19 DIAGNOSIS — E782 Mixed hyperlipidemia: Secondary | ICD-10-CM | POA: Diagnosis not present

## 2022-11-22 DIAGNOSIS — M79604 Pain in right leg: Secondary | ICD-10-CM | POA: Diagnosis not present

## 2022-11-22 DIAGNOSIS — M79661 Pain in right lower leg: Secondary | ICD-10-CM | POA: Diagnosis not present

## 2022-11-22 DIAGNOSIS — I83893 Varicose veins of bilateral lower extremities with other complications: Secondary | ICD-10-CM | POA: Diagnosis not present

## 2022-11-22 DIAGNOSIS — I8001 Phlebitis and thrombophlebitis of superficial vessels of right lower extremity: Secondary | ICD-10-CM | POA: Diagnosis not present

## 2022-11-22 DIAGNOSIS — M79605 Pain in left leg: Secondary | ICD-10-CM | POA: Diagnosis not present

## 2022-12-11 DIAGNOSIS — I83891 Varicose veins of right lower extremities with other complications: Secondary | ICD-10-CM | POA: Diagnosis not present

## 2022-12-11 DIAGNOSIS — R252 Cramp and spasm: Secondary | ICD-10-CM | POA: Diagnosis not present

## 2022-12-11 DIAGNOSIS — R600 Localized edema: Secondary | ICD-10-CM | POA: Diagnosis not present

## 2022-12-11 DIAGNOSIS — I80201 Phlebitis and thrombophlebitis of unspecified deep vessels of right lower extremity: Secondary | ICD-10-CM | POA: Diagnosis not present

## 2023-01-16 DIAGNOSIS — Z20822 Contact with and (suspected) exposure to covid-19: Secondary | ICD-10-CM | POA: Diagnosis not present

## 2023-01-16 DIAGNOSIS — U071 COVID-19: Secondary | ICD-10-CM | POA: Diagnosis not present

## 2023-01-24 DIAGNOSIS — I872 Venous insufficiency (chronic) (peripheral): Secondary | ICD-10-CM | POA: Diagnosis not present

## 2023-01-24 DIAGNOSIS — R600 Localized edema: Secondary | ICD-10-CM | POA: Diagnosis not present

## 2023-01-24 DIAGNOSIS — R252 Cramp and spasm: Secondary | ICD-10-CM | POA: Diagnosis not present

## 2023-01-24 DIAGNOSIS — I83891 Varicose veins of right lower extremities with other complications: Secondary | ICD-10-CM | POA: Diagnosis not present

## 2023-01-26 ENCOUNTER — Other Ambulatory Visit: Payer: Self-pay

## 2023-01-26 ENCOUNTER — Other Ambulatory Visit: Payer: Medicare Other

## 2023-01-26 DIAGNOSIS — E785 Hyperlipidemia, unspecified: Secondary | ICD-10-CM

## 2023-01-26 DIAGNOSIS — Z794 Long term (current) use of insulin: Secondary | ICD-10-CM | POA: Diagnosis not present

## 2023-01-26 DIAGNOSIS — E119 Type 2 diabetes mellitus without complications: Secondary | ICD-10-CM

## 2023-01-27 LAB — CMP14+EGFR
ALT: 25 IU/L (ref 0–32)
AST: 30 IU/L (ref 0–40)
Albumin/Globulin Ratio: 1.9 (ref 1.2–2.2)
Albumin: 4.9 g/dL — ABNORMAL HIGH (ref 3.8–4.8)
Alkaline Phosphatase: 55 IU/L (ref 44–121)
BUN/Creatinine Ratio: 17 (ref 12–28)
BUN: 19 mg/dL (ref 8–27)
Bilirubin Total: 0.3 mg/dL (ref 0.0–1.2)
CO2: 22 mmol/L (ref 20–29)
Calcium: 10.8 mg/dL — ABNORMAL HIGH (ref 8.7–10.3)
Chloride: 103 mmol/L (ref 96–106)
Creatinine, Ser: 1.15 mg/dL — ABNORMAL HIGH (ref 0.57–1.00)
Globulin, Total: 2.6 g/dL (ref 1.5–4.5)
Glucose: 137 mg/dL — ABNORMAL HIGH (ref 70–99)
Potassium: 5.3 mmol/L — ABNORMAL HIGH (ref 3.5–5.2)
Sodium: 140 mmol/L (ref 134–144)
Total Protein: 7.5 g/dL (ref 6.0–8.5)
eGFR: 49 mL/min/{1.73_m2} — ABNORMAL LOW (ref >59)

## 2023-01-27 LAB — CBC WITH DIFFERENTIAL
Basophils Absolute: 0.1 10*3/uL (ref 0.0–0.2)
Basos: 1 %
EOS (ABSOLUTE): 0.2 10*3/uL (ref 0.0–0.4)
Eos: 4 %
Hematocrit: 38.5 % (ref 34.0–46.6)
Hemoglobin: 13 g/dL (ref 11.1–15.9)
Immature Grans (Abs): 0 10*3/uL (ref 0.0–0.1)
Immature Granulocytes: 0 %
Lymphocytes Absolute: 2.5 10*3/uL (ref 0.7–3.1)
Lymphs: 42 %
MCH: 31.6 pg (ref 26.6–33.0)
MCHC: 33.8 g/dL (ref 31.5–35.7)
MCV: 93 fL (ref 79–97)
Monocytes Absolute: 0.4 10*3/uL (ref 0.1–0.9)
Monocytes: 6 %
Neutrophils Absolute: 2.8 10*3/uL (ref 1.4–7.0)
Neutrophils: 47 %
RBC: 4.12 x10E6/uL (ref 3.77–5.28)
RDW: 11.7 % (ref 11.7–15.4)
WBC: 6 10*3/uL (ref 3.4–10.8)

## 2023-01-27 LAB — LIPID PANEL
Chol/HDL Ratio: 5 ratio — ABNORMAL HIGH (ref 0.0–4.4)
Cholesterol, Total: 232 mg/dL — ABNORMAL HIGH (ref 100–199)
HDL: 46 mg/dL (ref >39)
LDL Chol Calc (NIH): 154 mg/dL — ABNORMAL HIGH (ref 0–99)
Triglycerides: 178 mg/dL — ABNORMAL HIGH (ref 0–149)
VLDL Cholesterol Cal: 32 mg/dL (ref 5–40)

## 2023-01-27 LAB — HEMOGLOBIN A1C
Est. average glucose Bld gHb Est-mCnc: 137 mg/dL
Hgb A1c MFr Bld: 6.4 % — ABNORMAL HIGH (ref 4.8–5.6)

## 2023-01-29 DIAGNOSIS — I872 Venous insufficiency (chronic) (peripheral): Secondary | ICD-10-CM | POA: Diagnosis not present

## 2023-02-05 DIAGNOSIS — Z09 Encounter for follow-up examination after completed treatment for conditions other than malignant neoplasm: Secondary | ICD-10-CM | POA: Diagnosis not present

## 2023-02-05 DIAGNOSIS — I83892 Varicose veins of left lower extremities with other complications: Secondary | ICD-10-CM | POA: Diagnosis not present

## 2023-02-06 ENCOUNTER — Encounter: Payer: Self-pay | Admitting: Internal Medicine

## 2023-02-06 ENCOUNTER — Ambulatory Visit (INDEPENDENT_AMBULATORY_CARE_PROVIDER_SITE_OTHER): Payer: Medicare Other | Admitting: Internal Medicine

## 2023-02-06 VITALS — BP 132/84 | HR 90 | Ht 66.0 in | Wt 248.0 lb

## 2023-02-06 DIAGNOSIS — Z794 Long term (current) use of insulin: Secondary | ICD-10-CM

## 2023-02-06 DIAGNOSIS — E785 Hyperlipidemia, unspecified: Secondary | ICD-10-CM | POA: Diagnosis not present

## 2023-02-06 DIAGNOSIS — G4733 Obstructive sleep apnea (adult) (pediatric): Secondary | ICD-10-CM | POA: Diagnosis not present

## 2023-02-06 DIAGNOSIS — E119 Type 2 diabetes mellitus without complications: Secondary | ICD-10-CM | POA: Diagnosis not present

## 2023-02-06 DIAGNOSIS — M159 Polyosteoarthritis, unspecified: Secondary | ICD-10-CM | POA: Diagnosis not present

## 2023-02-06 DIAGNOSIS — F419 Anxiety disorder, unspecified: Secondary | ICD-10-CM | POA: Diagnosis not present

## 2023-02-06 DIAGNOSIS — F32A Depression, unspecified: Secondary | ICD-10-CM

## 2023-02-06 DIAGNOSIS — N182 Chronic kidney disease, stage 2 (mild): Secondary | ICD-10-CM | POA: Diagnosis not present

## 2023-02-06 DIAGNOSIS — I129 Hypertensive chronic kidney disease with stage 1 through stage 4 chronic kidney disease, or unspecified chronic kidney disease: Secondary | ICD-10-CM | POA: Diagnosis not present

## 2023-02-06 LAB — POCT UA - MICROALBUMIN
Albumin/Creatinine Ratio, Urine, POC: <30
Creatinine, POC: 50 mg/dL
Microalbumin Ur, POC: 10 mg/L

## 2023-02-06 LAB — POCT CBG (FASTING - GLUCOSE)-MANUAL ENTRY: Glucose Fasting, POC: 129 mg/dL — AB (ref 70–99)

## 2023-02-06 MED ORDER — NEXLETOL 180 MG PO TABS: 180.0000 mg | ORAL_TABLET | Freq: Every day | ORAL | 1 refills | Status: DC

## 2023-02-06 NOTE — Progress Notes (Signed)
Established Patient Office Visit  Subjective:  Patient ID: Taylor Barry, female    DOB: 1946/02/01  Age: 77 y.o. MRN: [id number]  Chief Complaint  Patient presents with   Follow-up    3 month follow up    Patient comes in for follow-up accompanied by her daughter.  She was recently diagnosed with COVID infection and Paxlovid was prescribed.  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  Patient however recovered from it but has some residual cough and sputum production although she is feeling much better than before.   Patient's labs were done earlier this month.  Patient's LDL showed minimal improvement as she was given some samples of Livalo 2 mg and she took them but then stopped when she ran out.  Patient has been extremely intolerant to statins and she is unable to afford any of the other medications.  We will try getting Nexletol prescription approved for her. Otherwise patient is feeling well she is not complaining of any chest pain, no shortness of breath, no fatigue, and no aches or pains at this time.  She is tolerating her Zoloft at 50 mg/day. She is using her CPAP machine regularly every day.     Past Medical History:  Diagnosis Date   Allergic rhinitis with postnasal drip    Anxiety and depression    Arthritis    Arthritis, multiple joint involvement    Bronchitis, acute, with bronchospasm    Hair loss    HTN, goal below 150/90    Hyperlipidemia LDL goal <100    Obstructive sleep apnea, adult    CPAP   Right adrenal mass (HCC)    Vitamin D deficiency     Social History   Socioeconomic History   Marital status: Divorced    Spouse name: Not on file   Number of children: Not on file   Years of education: Not on file   Highest education level: Not on file  Occupational History   Not on file  Tobacco Use   Smoking status: Former    Packs/day:  0.25    Types: Cigarettes    Quit date: 01/25/1995    Years since quitting: 28.0   Smokeless tobacco: Never   Tobacco comments:    social smoker as teenager  Vaping Use   Vaping Use: Never used  Substance and Sexual Activity   Alcohol use: No    Alcohol/week: 0.0 standard drinks of alcohol   Drug use: No   Sexual activity: Never  Other Topics Concern   Not on file  Social History Narrative   Not on file   Social Determinants of Health   Financial Resource Strain: Not on file  Food Insecurity: Not on file  Transportation Needs: Not on file  Physical Activity: Not on file  Stress: Not on file  Social Connections: Not on file  Intimate Partner Violence: Not on file    Family History  Problem Relation Age of Onset   Heart disease Mother    Alcohol abuse Father    Diabetes Daughter    Breast cancer Maternal Aunt    Cancer Brother    Cancer Maternal Grandmother    Cancer Maternal Grandfather     Allergies  Allergen Reactions   Acetaminophen     Pt denies   Codeine Nausea Only   Hydrocodone-Acetaminophen Other (See Comments)    "  Puts me up the wall"   Oxycodone Hcl    Oxycodone-Acetaminophen     Hallucination   Prednisolone     Hallucination   Statins     Review of Systems  Constitutional:  Negative for chills, diaphoresis, fever, malaise/fatigue and weight loss.  HENT:  Positive for hearing loss. Negative for ear discharge, ear pain and tinnitus.   Eyes:  Negative for blurred vision, double vision, photophobia and pain.  Respiratory:  Positive for cough and sputum production. Negative for hemoptysis, shortness of breath and wheezing.   Cardiovascular:  Negative for chest pain, palpitations, orthopnea and leg swelling.  Gastrointestinal:  Negative for blood in stool, constipation and melena.  Genitourinary:  Negative for dysuria, frequency and urgency.  Musculoskeletal:  Negative for back pain, myalgias and neck pain.  Skin: Negative.   Neurological:  Negative  for dizziness, tingling, tremors, sensory change, speech change and headaches.  Endo/Heme/Allergies:  Does not bruise/bleed easily.  Psychiatric/Behavioral:  Negative for depression. The patient is not nervous/anxious and does not have insomnia.        Objective:   BP 132/84   Pulse 90   Ht 5' 6"$  (1.676 m)   Wt 248 lb (112.5 kg)   SpO2 95%   BMI 40.03 kg/m   Vitals:   02/06/23 1334  BP: 132/84  Pulse: 90  Height: 5' 6"$  (1.676 m)  Weight: 248 lb (112.5 kg)  SpO2: 95%  BMI (Calculated): 40.05    Physical Exam Vitals and nursing note reviewed.  Constitutional:      Appearance: Normal appearance.  HENT:     Head: Normocephalic.     Nose: Nose normal.  Eyes:     Pupils: Pupils are equal, round, and reactive to light.  Cardiovascular:     Rate and Rhythm: Normal rate.     Pulses: Normal pulses.  Pulmonary:     Effort: Pulmonary effort is normal.     Breath sounds: Normal breath sounds. No rhonchi or rales.  Abdominal:     General: Abdomen is flat.     Palpations: Abdomen is soft.  Musculoskeletal:        General: Normal range of motion.     Cervical back: Normal range of motion.  Skin:    General: Skin is warm.  Neurological:     General: No focal deficit present.     Mental Status: She is alert.  Psychiatric:        Mood and Affect: Mood normal.      Results for orders placed or performed in visit on 02/06/23  POCT CBG (Fasting - Glucose)  Result Value Ref Range   Glucose Fasting, POC 129 (A) 70 - 99 mg/dL  POCT UA - Microalbumin  Result Value Ref Range   Microalbumin Ur, POC 10 mg/L   Creatinine, POC 50 mg/dL   Albumin/Creatinine Ratio, Urine, POC <30     Recent Results (from the past 2160 hour(s))  CBC With Differential     Status: None   Collection Time: 01/26/23  1:13 PM  Result Value Ref Range   WBC 6.0 3.4 - 10.8 x10E3/uL   RBC 4.12 3.77 - 5.28 x10E6/uL   Hemoglobin 13.0 11.1 - 15.9 g/dL   Hematocrit 38.5 34.0 - 46.6 %   MCV 93 79 - 97 fL    MCH 31.6 26.6 - 33.0 pg   MCHC 33.8 31.5 - 35.7 g/dL   RDW 11.7 11.7 - 15.4 %   Neutrophils 47 Not Estab. %  Lymphs 42 Not Estab. %   Monocytes 6 Not Estab. %   Eos 4 Not Estab. %   Basos 1 Not Estab. %   Neutrophils Absolute 2.8 1.4 - 7.0 x10E3/uL   Lymphocytes Absolute 2.5 0.7 - 3.1 x10E3/uL   Monocytes Absolute 0.4 0.1 - 0.9 x10E3/uL   EOS (ABSOLUTE) 0.2 0.0 - 0.4 x10E3/uL   Basophils Absolute 0.1 0.0 - 0.2 x10E3/uL   Immature Granulocytes 0 Not Estab. %   Immature Grans (Abs) 0.0 0.0 - 0.1 x10E3/uL  CMP14+EGFR     Status: Abnormal   Collection Time: 01/26/23  1:13 PM  Result Value Ref Range   Glucose 137 (H) 70 - 99 mg/dL   BUN 19 8 - 27 mg/dL   Creatinine, Ser 1.15 (H) 0.57 - 1.00 mg/dL   eGFR 49 (L) >59 mL/min/1.73   BUN/Creatinine Ratio 17 12 - 28   Sodium 140 134 - 144 mmol/L   Potassium 5.3 (H) 3.5 - 5.2 mmol/L   Chloride 103 96 - 106 mmol/L   CO2 22 20 - 29 mmol/L   Calcium 10.8 (H) 8.7 - 10.3 mg/dL   Total Protein 7.5 6.0 - 8.5 g/dL   Albumin 4.9 (H) 3.8 - 4.8 g/dL   Globulin, Total 2.6 1.5 - 4.5 g/dL   Albumin/Globulin Ratio 1.9 1.2 - 2.2   Bilirubin Total 0.3 0.0 - 1.2 mg/dL   Alkaline Phosphatase 55 44 - 121 IU/L   AST 30 0 - 40 IU/L   ALT 25 0 - 32 IU/L  Hemoglobin A1c     Status: Abnormal   Collection Time: 01/26/23  1:13 PM  Result Value Ref Range   Hgb A1c MFr Bld 6.4 (H) 4.8 - 5.6 %    Comment:          Prediabetes: 5.7 - 6.4          Diabetes: >6.4          Glycemic control for adults with diabetes: <7.0    Est. average glucose Bld gHb Est-mCnc 137 mg/dL  Lipid panel     Status: Abnormal   Collection Time: 01/26/23  1:13 PM  Result Value Ref Range   Cholesterol, Total 232 (H) 100 - 199 mg/dL   Triglycerides 178 (H) 0 - 149 mg/dL   HDL 46 >39 mg/dL   VLDL Cholesterol Cal 32 5 - 40 mg/dL   LDL Chol Calc (NIH) 154 (H) 0 - 99 mg/dL   Chol/HDL Ratio 5.0 (H) 0.0 - 4.4 ratio    Comment:                                   T. Chol/HDL Ratio                                              Men  Women                               1/2 Avg.Risk  3.4    3.3                                   Avg.Risk  5.0    4.4  2X Avg.Risk  9.6    7.1                                3X Avg.Risk 23.4   11.0   POCT CBG (Fasting - Glucose)     Status: Abnormal   Collection Time: 02/06/23  1:39 PM  Result Value Ref Range   Glucose Fasting, POC 129 (A) 70 - 99 mg/dL  POCT UA - Microalbumin     Status: Normal   Collection Time: 02/06/23  2:16 PM  Result Value Ref Range   Microalbumin Ur, POC 10 mg/L   Creatinine, POC 50 mg/dL   Albumin/Creatinine Ratio, Urine, POC <30       Assessment & Plan:   Problem List Items Addressed This Visit     Anxiety and depression   Relevant Medications   sertraline (ZOLOFT) 25 MG tablet   Osteoarthritis of multiple joints   Hyperlipidemia LDL goal <100   Relevant Medications   ELIQUIS 2.5 MG TABS tablet   fenofibrate (TRICOR) 145 MG tablet   Bempedoic Acid (NEXLETOL) 180 MG TABS   Obstructive sleep apnea of adult   Benign hypertension with CKD (chronic kidney disease), stage II   Relevant Medications   ELIQUIS 2.5 MG TABS tablet   fenofibrate (TRICOR) 145 MG tablet   Bempedoic Acid (NEXLETOL) 180 MG TABS   Type 2 diabetes mellitus without complication, with long-term current use of insulin (HCC) - Primary   Relevant Orders   POCT CBG (Fasting - Glucose) (Completed)   POCT UA - Microalbumin (Completed)    Return in about 3 months (around 05/07/2023).  Fasting labs before next visit.  Total time spent: 30 minutes  Perrin Maltese, MD  02/06/2023

## 2023-02-07 ENCOUNTER — Telehealth: Payer: Self-pay

## 2023-02-07 ENCOUNTER — Other Ambulatory Visit: Payer: Self-pay | Admitting: Internal Medicine

## 2023-02-07 DIAGNOSIS — I872 Venous insufficiency (chronic) (peripheral): Secondary | ICD-10-CM | POA: Diagnosis not present

## 2023-02-07 NOTE — Telephone Encounter (Signed)
Patient called about a medication not being covered, possibly needing a PA and asking about her zoloft for 90 days but she was asking for you to call so she could talk with you

## 2023-02-08 ENCOUNTER — Other Ambulatory Visit: Payer: Self-pay

## 2023-02-14 DIAGNOSIS — Z09 Encounter for follow-up examination after completed treatment for conditions other than malignant neoplasm: Secondary | ICD-10-CM | POA: Diagnosis not present

## 2023-02-19 DIAGNOSIS — M7989 Other specified soft tissue disorders: Secondary | ICD-10-CM | POA: Diagnosis not present

## 2023-02-19 DIAGNOSIS — I83811 Varicose veins of right lower extremities with pain: Secondary | ICD-10-CM | POA: Diagnosis not present

## 2023-02-19 DIAGNOSIS — I83891 Varicose veins of right lower extremities with other complications: Secondary | ICD-10-CM | POA: Diagnosis not present

## 2023-02-21 DIAGNOSIS — I83892 Varicose veins of left lower extremities with other complications: Secondary | ICD-10-CM | POA: Diagnosis not present

## 2023-03-05 ENCOUNTER — Telehealth: Payer: Self-pay

## 2023-03-05 NOTE — Telephone Encounter (Signed)
Pt's daughter called regarding nexletol rx, said insurance doesn't cover & pt is on last box of samples, she asked about an alternate rx for pt? Please advise

## 2023-03-08 ENCOUNTER — Other Ambulatory Visit: Payer: Self-pay

## 2023-03-08 DIAGNOSIS — E785 Hyperlipidemia, unspecified: Secondary | ICD-10-CM

## 2023-03-08 MED ORDER — NEXLETOL 180 MG PO TABS: 180.0000 mg | ORAL_TABLET | Freq: Every day | ORAL | 1 refills | Status: DC

## 2023-03-11 ENCOUNTER — Other Ambulatory Visit: Payer: Self-pay | Admitting: Family

## 2023-03-12 ENCOUNTER — Telehealth: Payer: Self-pay

## 2023-03-12 NOTE — Telephone Encounter (Signed)
Pt's daughter called and left vm regarding pt's rx Nexletol, I was able to get approved through her insurance but said rx is still $491- there's no patient assistance program for Nexletol so she asked if we could send alternate rx for pt? Please advise

## 2023-03-13 DIAGNOSIS — I83891 Varicose veins of right lower extremities with other complications: Secondary | ICD-10-CM | POA: Diagnosis not present

## 2023-03-22 DIAGNOSIS — I83812 Varicose veins of left lower extremities with pain: Secondary | ICD-10-CM | POA: Diagnosis not present

## 2023-03-22 DIAGNOSIS — I83892 Varicose veins of left lower extremities with other complications: Secondary | ICD-10-CM | POA: Diagnosis not present

## 2023-04-04 ENCOUNTER — Other Ambulatory Visit: Payer: Self-pay | Admitting: Family

## 2023-04-06 NOTE — Telephone Encounter (Signed)
I have discussed this plan of care with Dr. Neelam Khan  

## 2023-05-07 ENCOUNTER — Ambulatory Visit: Payer: Medicare Other | Admitting: Internal Medicine

## 2023-05-07 ENCOUNTER — Other Ambulatory Visit: Payer: Self-pay | Admitting: Internal Medicine

## 2023-05-07 ENCOUNTER — Telehealth: Payer: Self-pay

## 2023-05-07 DIAGNOSIS — Z794 Long term (current) use of insulin: Secondary | ICD-10-CM

## 2023-05-07 DIAGNOSIS — I10 Essential (primary) hypertension: Secondary | ICD-10-CM

## 2023-05-07 DIAGNOSIS — E782 Mixed hyperlipidemia: Secondary | ICD-10-CM

## 2023-05-07 NOTE — Telephone Encounter (Signed)
Taylor Barry called and left vm regarding if pt needs to have labs done before appt on 5/28? Please advise

## 2023-05-22 ENCOUNTER — Other Ambulatory Visit: Payer: Medicare Other

## 2023-05-22 ENCOUNTER — Ambulatory Visit (INDEPENDENT_AMBULATORY_CARE_PROVIDER_SITE_OTHER): Payer: Medicare Other | Admitting: Internal Medicine

## 2023-05-22 ENCOUNTER — Encounter: Payer: Self-pay | Admitting: Internal Medicine

## 2023-05-22 VITALS — BP 134/70 | HR 86 | Ht 66.0 in | Wt 259.0 lb

## 2023-05-22 DIAGNOSIS — E1165 Type 2 diabetes mellitus with hyperglycemia: Secondary | ICD-10-CM | POA: Diagnosis not present

## 2023-05-22 DIAGNOSIS — I10 Essential (primary) hypertension: Secondary | ICD-10-CM

## 2023-05-22 DIAGNOSIS — Z1231 Encounter for screening mammogram for malignant neoplasm of breast: Secondary | ICD-10-CM

## 2023-05-22 DIAGNOSIS — K219 Gastro-esophageal reflux disease without esophagitis: Secondary | ICD-10-CM

## 2023-05-22 DIAGNOSIS — G4733 Obstructive sleep apnea (adult) (pediatric): Secondary | ICD-10-CM

## 2023-05-22 DIAGNOSIS — E782 Mixed hyperlipidemia: Secondary | ICD-10-CM | POA: Diagnosis not present

## 2023-05-22 DIAGNOSIS — R0602 Shortness of breath: Secondary | ICD-10-CM | POA: Diagnosis not present

## 2023-05-22 DIAGNOSIS — R0609 Other forms of dyspnea: Secondary | ICD-10-CM | POA: Diagnosis not present

## 2023-05-22 LAB — POCT CBG (FASTING - GLUCOSE)-MANUAL ENTRY: Glucose Fasting, POC: 153 mg/dL — AB (ref 70–99)

## 2023-05-22 MED ORDER — PANTOPRAZOLE SODIUM 40 MG PO TBEC
40.0000 mg | DELAYED_RELEASE_TABLET | Freq: Every day | ORAL | 4 refills | Status: DC
Start: 2023-05-22 — End: 2024-01-04

## 2023-05-22 NOTE — Progress Notes (Signed)
Established Patient Office Visit  Subjective:  Patient ID: Taylor Barry, female    DOB: 1946-05-25  Age: 77 y.o. MRN: [id number]  Chief Complaint  Patient presents with   Follow-up    3 month follow up    Patient comes in for follow-up accompanied by her daughter.  She has fasting blood work done earlier today.  Results are not available. Patient has gained weight since her last visit.  She admits to eating more and not being active enough.  Patient mentions some exertional dyspnea and has trouble walking without stopping to rest.  Patient has aches and pains in her legs as well as knee and knee joints. She denies chest pain or heaviness, but is concerned about her shortness of breath.  Her O2 sats at room air is 97%. Her EKG done today shows normal sinus rhythm and 1 PVC.  Will will schedule 2D echo and cardiology follow-up. She would also like a referral to a pulmonologist.     No other concerns at this time.   Past Medical History:  Diagnosis Date   Allergic rhinitis with postnasal drip    Anxiety and depression    Arthritis    Arthritis, multiple joint involvement    Bronchitis, acute, with bronchospasm    Hair loss    HTN, goal below 150/90    Hyperlipidemia LDL goal <100    Obstructive sleep apnea, adult    CPAP   Right adrenal mass (HCC)    Vitamin D deficiency     Past Surgical History:  Procedure Laterality Date   ABDOMINAL HYSTERECTOMY  1968   total   COLONOSCOPY WITH PROPOFOL N/A 04/15/2021   Procedure: COLONOSCOPY WITH PROPOFOL;  Surgeon: Midge Minium, MD;  Location: [facility];  Service: Endoscopy;  Laterality: N/A;  [employer] - oral meds priority 4   REPLACEMENT TOTAL KNEE Bilateral    Right knee first, Left knee more recent   TYMPANOPLASTY Right 1977    Social History   Socioeconomic History   Marital status: Divorced    Spouse name: Not on file   Number of children: Not on file   Years of education: Not on file   Highest education  level: Not on file  Occupational History   Not on file  Tobacco Use   Smoking status: Former    Packs/day: .25    Types: Cigarettes    Quit date: 01/25/1995    Years since quitting: 28.3   Smokeless tobacco: Never   Tobacco comments:    social smoker as teenager  Vaping Use   Vaping Use: Never used  Substance and Sexual Activity   Alcohol use: No    Alcohol/week: 0.0 standard drinks of alcohol   Drug use: No   Sexual activity: Never  Other Topics Concern   Not on file  Social History Narrative   Not on file   Social Determinants of Health   Financial Resource Strain: Not on file  Food Insecurity: Not on file  Transportation Needs: Not on file  Physical Activity: Not on file  Stress: Not on file  Social Connections: Not on file  Intimate Partner Violence: Not on file    Family History  Problem Relation Age of Onset   Heart disease Mother    Alcohol abuse Father    Diabetes Daughter    Breast cancer Maternal Aunt    Cancer Brother    Cancer Maternal Grandmother    Cancer Maternal Grandfather  Allergies  Allergen Reactions   Acetaminophen     Pt denies   Codeine Nausea Only   Hydrocodone-Acetaminophen Other (See Comments)    "Puts me up the wall"   Oxycodone Hcl    Oxycodone-Acetaminophen     Hallucination   Prednisolone     Hallucination   Statins     Review of Systems  Constitutional:  Positive for malaise/fatigue. Negative for chills, fever and weight loss.  HENT: Negative.    Eyes: Negative.   Respiratory:  Positive for shortness of breath. Negative for cough, sputum production and wheezing.   Cardiovascular:  Positive for orthopnea. Negative for chest pain, leg swelling and PND.  Gastrointestinal:  Positive for heartburn. Negative for abdominal pain, diarrhea, melena and nausea.  Genitourinary: Negative.   Musculoskeletal:  Positive for myalgias. Negative for back pain, falls, joint pain and neck pain.  Neurological:  Negative for dizziness,  tingling, sensory change, speech change, weakness and headaches.  Psychiatric/Behavioral:  Negative for depression. The patient is not nervous/anxious and does not have insomnia.        Objective:   BP 134/70   Pulse 86   Ht 5\' 6"  (1.676 m)   Wt 259 lb (117.5 kg)   SpO2 97%   BMI 41.80 kg/m   Vitals:   05/22/23 1443  BP: 134/70  Pulse: 86  Height: 5\' 6"  (1.676 m)  Weight: 259 lb (117.5 kg)  SpO2: 97%  BMI (Calculated): 41.82    Physical Exam Vitals and nursing note reviewed.  Constitutional:      Appearance: Normal appearance. She is obese.  Eyes:     Conjunctiva/sclera: Conjunctivae normal.  Cardiovascular:     Rate and Rhythm: Normal rate and regular rhythm.     Pulses: Normal pulses.     Heart sounds: Normal heart sounds.  Pulmonary:     Effort: Pulmonary effort is normal.     Breath sounds: Normal breath sounds. No wheezing or rales.  Abdominal:     General: Bowel sounds are normal.     Palpations: There is no mass.     Tenderness: There is no abdominal tenderness. There is no right CVA tenderness or left CVA tenderness.  Musculoskeletal:        General: Normal range of motion.     Cervical back: Normal range of motion and neck supple. No tenderness.     Right lower leg: No edema.     Left lower leg: No edema.  Neurological:     General: No focal deficit present.     Mental Status: She is alert and oriented to person, place, and time.  Psychiatric:        Mood and Affect: Mood normal.        Behavior: Behavior normal.      Results for orders placed or performed in visit on 05/22/23  POCT CBG (Fasting - Glucose)  Result Value Ref Range   Glucose Fasting, POC 153 (A) 70 - 99 mg/dL    Recent Results (from the past 2160 hour(s))  POCT CBG (Fasting - Glucose)     Status: Abnormal   Collection Time: 05/22/23  2:51 PM  Result Value Ref Range   Glucose Fasting, POC 153 (A) 70 - 99 mg/dL      Assessment & Plan:  Add next.  2D echo and cardiology  follow-up.  Pulmonary consult Problem List Items Addressed This Visit     Mixed hyperlipidemia   Relevant Orders   Lipid Panel w/o  Chol/HDL Ratio   Obstructive sleep apnea of adult   Relevant Orders   Ambulatory referral to Pulmonology   Esophageal reflux   Relevant Medications   pantoprazole (PROTONIX) 40 MG tablet   Type 2 diabetes mellitus with hyperglycemia, without long-term current use of insulin (HCC) - Primary   Relevant Orders   POCT CBG (Fasting - Glucose) (Completed)   Hemoglobin A1c   Essential hypertension, benign   Relevant Orders   CMP14+EGFR   Other Visit Diagnoses     Breast cancer screening by mammogram       Relevant Orders   MM 3D SCREENING MAMMOGRAM BILATERAL BREAST   Exertional dyspnea       Relevant Orders   EKG 12-Lead   PCV ECHOCARDIOGRAM COMPLETE   Shortness of breath       Relevant Orders   Ambulatory referral to Pulmonology       Return in about 3 months (around 08/22/2023).   Total time spent: 30 minutes  Margaretann Loveless, MD  05/22/2023   This document may have been prepared by [facility] Voice Recognition software and as such may include unintentional dictation errors.

## 2023-05-23 LAB — CMP14+EGFR
ALT: 15 IU/L (ref 0–32)
AST: 26 IU/L (ref 0–40)
Albumin/Globulin Ratio: 1.7 (ref 1.2–2.2)
Albumin: 4.5 g/dL (ref 3.8–4.8)
Alkaline Phosphatase: 56 IU/L (ref 44–121)
BUN/Creatinine Ratio: 21 (ref 12–28)
BUN: 20 mg/dL (ref 8–27)
Bilirubin Total: 0.3 mg/dL (ref 0.0–1.2)
CO2: 21 mmol/L (ref 20–29)
Calcium: 10.4 mg/dL — ABNORMAL HIGH (ref 8.7–10.3)
Chloride: 100 mmol/L (ref 96–106)
Creatinine, Ser: 0.97 mg/dL (ref 0.57–1.00)
Globulin, Total: 2.7 g/dL (ref 1.5–4.5)
Glucose: 153 mg/dL — ABNORMAL HIGH (ref 70–99)
Potassium: 5.1 mmol/L (ref 3.5–5.2)
Sodium: 137 mmol/L (ref 134–144)
Total Protein: 7.2 g/dL (ref 6.0–8.5)
eGFR: 61 mL/min/{1.73_m2} (ref >59)

## 2023-05-23 LAB — HEMOGLOBIN A1C
Est. average glucose Bld gHb Est-mCnc: 154 mg/dL
Hgb A1c MFr Bld: 7 % — ABNORMAL HIGH (ref 4.8–5.6)

## 2023-05-23 LAB — LIPID PANEL W/O CHOL/HDL RATIO
Cholesterol, Total: 200 mg/dL — ABNORMAL HIGH (ref 100–199)
HDL: 19 mg/dL — ABNORMAL LOW (ref >39)
LDL Chol Calc (NIH): 129 mg/dL — ABNORMAL HIGH (ref 0–99)
Triglycerides: 291 mg/dL — ABNORMAL HIGH (ref 0–149)
VLDL Cholesterol Cal: 52 mg/dL — ABNORMAL HIGH (ref 5–40)

## 2023-05-24 ENCOUNTER — Telehealth: Payer: Self-pay

## 2023-05-24 ENCOUNTER — Other Ambulatory Visit: Payer: Self-pay

## 2023-05-24 DIAGNOSIS — M179 Osteoarthritis of knee, unspecified: Secondary | ICD-10-CM

## 2023-05-24 DIAGNOSIS — M159 Polyosteoarthritis, unspecified: Secondary | ICD-10-CM

## 2023-05-24 NOTE — Telephone Encounter (Signed)
Sharon informed.

## 2023-05-24 NOTE — Telephone Encounter (Signed)
Pt's daughter called, I gave them Nexlizet samples at her appt Tuesday instead of Nexletol (what pt was taking) & didn't realize. We don't have any Nexletol samples & she asked if she can just take the Nexlizet instead? Please advise

## 2023-05-29 ENCOUNTER — Other Ambulatory Visit: Payer: Medicare Other

## 2023-06-01 ENCOUNTER — Ambulatory Visit: Payer: Medicare Other | Admitting: Cardiovascular Disease

## 2023-06-07 ENCOUNTER — Ambulatory Visit
Admission: RE | Admit: 2023-06-07 | Discharge: 2023-06-07 | Disposition: A | Discharge status: Home / Self Care | Payer: Medicare Other | Source: Ambulatory Visit | Attending: Internal Medicine | Admitting: Internal Medicine

## 2023-06-07 DIAGNOSIS — Z1231 Encounter for screening mammogram for malignant neoplasm of breast: Secondary | ICD-10-CM | POA: Diagnosis not present

## 2023-06-08 ENCOUNTER — Other Ambulatory Visit: Payer: Self-pay

## 2023-06-08 ENCOUNTER — Ambulatory Visit (INDEPENDENT_AMBULATORY_CARE_PROVIDER_SITE_OTHER): Payer: Self-pay

## 2023-06-08 DIAGNOSIS — I340 Nonrheumatic mitral (valve) insufficiency: Secondary | ICD-10-CM | POA: Diagnosis not present

## 2023-06-08 DIAGNOSIS — R062 Wheezing: Secondary | ICD-10-CM | POA: Diagnosis not present

## 2023-06-08 DIAGNOSIS — I361 Nonrheumatic tricuspid (valve) insufficiency: Secondary | ICD-10-CM | POA: Diagnosis not present

## 2023-06-08 DIAGNOSIS — R0609 Other forms of dyspnea: Secondary | ICD-10-CM

## 2023-06-08 DIAGNOSIS — R0602 Shortness of breath: Secondary | ICD-10-CM | POA: Diagnosis not present

## 2023-06-11 ENCOUNTER — Encounter: Payer: Self-pay | Admitting: Cardiovascular Disease

## 2023-06-11 ENCOUNTER — Ambulatory Visit (INDEPENDENT_AMBULATORY_CARE_PROVIDER_SITE_OTHER): Payer: Medicare Other | Admitting: Cardiovascular Disease

## 2023-06-11 VITALS — BP 130/79 | HR 78 | Ht 66.0 in | Wt 257.0 lb

## 2023-06-11 DIAGNOSIS — G4733 Obstructive sleep apnea (adult) (pediatric): Secondary | ICD-10-CM | POA: Diagnosis not present

## 2023-06-11 DIAGNOSIS — I10 Essential (primary) hypertension: Secondary | ICD-10-CM

## 2023-06-11 DIAGNOSIS — R0602 Shortness of breath: Secondary | ICD-10-CM | POA: Diagnosis not present

## 2023-06-11 DIAGNOSIS — R0789 Other chest pain: Secondary | ICD-10-CM

## 2023-06-11 DIAGNOSIS — I350 Nonrheumatic aortic (valve) stenosis: Secondary | ICD-10-CM | POA: Diagnosis not present

## 2023-06-11 DIAGNOSIS — R062 Wheezing: Secondary | ICD-10-CM | POA: Diagnosis not present

## 2023-06-11 DIAGNOSIS — I340 Nonrheumatic mitral (valve) insufficiency: Secondary | ICD-10-CM

## 2023-06-11 MED ORDER — SPIRONOLACTONE 25 MG PO TABS
25.0000 mg | ORAL_TABLET | Freq: Every day | ORAL | 11 refills | Status: DC
Start: 2023-06-11 — End: 2023-06-11

## 2023-06-11 MED ORDER — SPIRONOLACTONE 25 MG PO TABS
12.5000 mg | ORAL_TABLET | Freq: Every day | ORAL | 11 refills | Status: DC
Start: 2023-06-11 — End: 2024-09-17

## 2023-06-11 NOTE — Progress Notes (Signed)
Cardiology Office Note   Date:  06/11/2023   ID:  Taylor Barry, DOB March 14, 1946, MRN [id number]  PCP:  Margaretann Loveless, MD  Cardiologist:  Adrian Blackwater, MD      History of Present Illness: Taylor Barry is a 77 y.o. female who presents for  Chief Complaint  Patient presents with   Follow-up    Follow up     Get chest pain, and very SOB and seeing Dr. Meredeth Ide and      Past Medical History:  Diagnosis Date   Allergic rhinitis with postnasal drip    Anxiety and depression    Arthritis    Arthritis, multiple joint involvement    Bronchitis, acute, with bronchospasm    Hair loss    HTN, goal below 150/90    Hyperlipidemia LDL goal <100    Obstructive sleep apnea, adult    CPAP   Right adrenal mass (HCC)    Vitamin D deficiency      Past Surgical History:  Procedure Laterality Date   ABDOMINAL HYSTERECTOMY  1968   total   COLONOSCOPY WITH PROPOFOL N/A 04/15/2021   Procedure: COLONOSCOPY WITH PROPOFOL;  Surgeon: Midge Minium, MD;  Location: [facility];  Service: Endoscopy;  Laterality: N/A;  [employer] - oral meds priority 4   REPLACEMENT TOTAL KNEE Bilateral    Right knee first, Left knee more recent   TYMPANOPLASTY Right 1977     Current Outpatient Medications  Medication Sig Dispense Refill   aspirin 81 MG chewable tablet Chew by mouth.     Bempedoic Acid (NEXLETOL) 180 MG TABS Take 1 tablet (180 mg total) by mouth daily. 90 tablet 1   diazepam (VALIUM) 10 MG tablet TAKE 1 TABLET BY MOUTH EVERY DAY AS NEEDED FOR ANXIETY 30 tablet 1   fenofibrate (TRICOR) 145 MG tablet TAKE 1 TABLET BY MOUTH ONCE  DAILY 90 tablet 3   fluocinonide (LIDEX) 0.05 % external solution Apply 1-2 drops to affected areas scalp once to twice daily as needed for itch. Avoid face. 60 mL 2   ibuprofen (ADVIL) 800 MG tablet SMARTSIG:1 Tablet(s) By Mouth 1 to 3 Times Daily PRN     ketoconazole (NIZORAL) 2 % shampoo Massage into scalp 3 times a week, let sit 5 minutes, then rinse. 120  mL 3   losartan-hydrochlorothiazide (HYZAAR) 100-12.5 MG tablet Take 1 tablet by mouth daily.     metFORMIN (GLUCOPHAGE-XR) 500 MG 24 hr tablet Take 500 mg by mouth 2 (two) times daily.     Multiple Vitamins-Minerals (MULTIVITAMIN WITH MINERALS) tablet Take 1 tablet by mouth daily.     ondansetron (ZOFRAN-ODT) 4 MG disintegrating tablet Take 4 mg by mouth every 8 (eight) hours as needed. (Patient not taking: Reported on 03/24/2021)     ONETOUCH VERIO test strip 3 (three) times daily.     pantoprazole (PROTONIX) 40 MG tablet Take 1 tablet (40 mg total) by mouth daily. 30 tablet 4   sertraline (ZOLOFT) 25 MG tablet TAKE 1 TABLET BY MOUTH EVERY DAY 90 tablet 2   spironolactone (ALDACTONE) 25 MG tablet Take 0.5 tablets (12.5 mg total) by mouth daily. 30 tablet 11   No current facility-administered medications for this visit.    Allergies:   Acetaminophen, Codeine, Hydrocodone-acetaminophen, Oxycodone hcl, Oxycodone-acetaminophen, Prednisolone, and Statins    Social History:   reports that she quit smoking about 28 years ago. She smoked an average of .25 packs per day. She has never used smokeless tobacco.  She reports that she does not drink alcohol and does not use drugs.   Family History:  family history includes Alcohol abuse in her father; Breast cancer in her maternal aunt; Cancer in her brother, maternal grandfather, and maternal grandmother; Diabetes in her daughter; Heart disease in her mother.    ROS:     Review of Systems  Constitutional: Negative.   HENT: Negative.    Eyes: Negative.   Respiratory: Negative.    Gastrointestinal: Negative.   Genitourinary: Negative.   Musculoskeletal: Negative.   Skin: Negative.   Neurological: Negative.   Endo/Heme/Allergies: Negative.   Psychiatric/Behavioral: Negative.    All other systems reviewed and are negative.     All other systems are reviewed and negative.    PHYSICAL EXAM: VS:  BP 130/79   Pulse 78   Ht 5\' 6"  (1.676 m)    Wt 257 lb (116.6 kg)   SpO2 96%   BMI 41.48 kg/m  , BMI Body mass index is 41.48 kg/m. Last weight:  Wt Readings from Last 3 Encounters:  06/11/23 257 lb (116.6 kg)  05/22/23 259 lb (117.5 kg)  02/06/23 248 lb (112.5 kg)     Physical Exam Constitutional:      Appearance: Normal appearance.  Cardiovascular:     Rate and Rhythm: Normal rate and regular rhythm.     Heart sounds: Normal heart sounds.  Pulmonary:     Effort: Pulmonary effort is normal.     Breath sounds: Normal breath sounds.  Musculoskeletal:     Right lower leg: No edema.     Left lower leg: No edema.  Neurological:     Mental Status: She is alert.       EKG:   Recent Labs: 01/26/2023: Hemoglobin 13.0 05/22/2023: ALT 15; BUN 20; Creatinine, Ser 0.97; Potassium 5.1; Sodium 137    Lipid Panel    Component Value Date/Time   CHOL 200 (H) 05/22/2023 1510   TRIG 291 (H) 05/22/2023 1510   HDL 19 (L) 05/22/2023 1510   CHOLHDL 5.0 (H) 01/26/2023 1313   LDLCALC 129 (H) 05/22/2023 1510      Other studies Reviewed: Additional studies/ records that were reviewed today include:  Review of the above records demonstrates:       No data to display            ASSESSMENT AND PLAN:    ICD-10-CM   1. Other chest pain  R07.89 MYOCARDIAL PERFUSION IMAGING    spironolactone (ALDACTONE) 25 MG tablet    DISCONTINUED: spironolactone (ALDACTONE) 25 MG tablet   Has DOE , advise stress test    2. SOB (shortness of breath)  R06.02 MYOCARDIAL PERFUSION IMAGING    spironolactone (ALDACTONE) 25 MG tablet    DISCONTINUED: spironolactone (ALDACTONE) 25 MG tablet   has diastolic dysfunction, advise aldactone    3. Primary hypertension  I10 MYOCARDIAL PERFUSION IMAGING    spironolactone (ALDACTONE) 25 MG tablet    DISCONTINUED: spironolactone (ALDACTONE) 25 MG tablet    4. Nonrheumatic mitral valve regurgitation  I34.0 MYOCARDIAL PERFUSION IMAGING    spironolactone (ALDACTONE) 25 MG tablet    DISCONTINUED:  spironolactone (ALDACTONE) 25 MG tablet    5. Obstructive sleep apnea syndrome  G47.33 MYOCARDIAL PERFUSION IMAGING    spironolactone (ALDACTONE) 25 MG tablet    DISCONTINUED: spironolactone (ALDACTONE) 25 MG tablet    6. Nonrheumatic aortic valve stenosis  I35.0 MYOCARDIAL PERFUSION IMAGING    spironolactone (ALDACTONE) 25 MG tablet    DISCONTINUED: spironolactone (  ALDACTONE) 25 MG tablet   mild       Problem List Items Addressed This Visit       Other   Chest pain - Primary   Relevant Medications   spironolactone (ALDACTONE) 25 MG tablet   Other Relevant Orders   MYOCARDIAL PERFUSION IMAGING   Other Visit Diagnoses     SOB (shortness of breath)       has diastolic dysfunction, advise aldactone   Relevant Medications   spironolactone (ALDACTONE) 25 MG tablet   Other Relevant Orders   MYOCARDIAL PERFUSION IMAGING   Primary hypertension       Relevant Medications   spironolactone (ALDACTONE) 25 MG tablet   Other Relevant Orders   MYOCARDIAL PERFUSION IMAGING   Nonrheumatic mitral valve regurgitation       Relevant Medications   spironolactone (ALDACTONE) 25 MG tablet   Other Relevant Orders   MYOCARDIAL PERFUSION IMAGING   Obstructive sleep apnea syndrome       Relevant Medications   spironolactone (ALDACTONE) 25 MG tablet   Other Relevant Orders   MYOCARDIAL PERFUSION IMAGING   Nonrheumatic aortic valve stenosis       mild   Relevant Medications   spironolactone (ALDACTONE) 25 MG tablet   Other Relevant Orders   MYOCARDIAL PERFUSION IMAGING          Disposition:   Return in about 4 weeks (around 07/09/2023) for stress test and f/u.    Total time spent: 30 minutes  Signed,  Adrian Blackwater, MD  06/11/2023 11:37 AM    [facility]

## 2023-06-18 ENCOUNTER — Other Ambulatory Visit: Payer: Self-pay | Admitting: Specialist

## 2023-06-18 ENCOUNTER — Ambulatory Visit (INDEPENDENT_AMBULATORY_CARE_PROVIDER_SITE_OTHER): Payer: Medicare Other

## 2023-06-18 DIAGNOSIS — I340 Nonrheumatic mitral (valve) insufficiency: Secondary | ICD-10-CM

## 2023-06-18 DIAGNOSIS — I350 Nonrheumatic aortic (valve) stenosis: Secondary | ICD-10-CM | POA: Diagnosis not present

## 2023-06-18 DIAGNOSIS — R0789 Other chest pain: Secondary | ICD-10-CM | POA: Diagnosis not present

## 2023-06-18 DIAGNOSIS — R0602 Shortness of breath: Secondary | ICD-10-CM | POA: Diagnosis not present

## 2023-06-18 DIAGNOSIS — G4733 Obstructive sleep apnea (adult) (pediatric): Secondary | ICD-10-CM

## 2023-06-18 DIAGNOSIS — J984 Other disorders of lung: Secondary | ICD-10-CM

## 2023-06-18 DIAGNOSIS — I10 Essential (primary) hypertension: Secondary | ICD-10-CM

## 2023-06-18 MED ORDER — TECHNETIUM TC 99M SESTAMIBI GENERIC - CARDIOLITE
10.7000 | Freq: Once | INTRAVENOUS | Status: AC | PRN
Start: 2023-06-18 — End: 2023-06-18
  Administered 2023-06-18: 10.7 via INTRAVENOUS

## 2023-06-18 MED ORDER — TECHNETIUM TC 99M SESTAMIBI GENERIC - CARDIOLITE
31.6000 | Freq: Once | INTRAVENOUS | Status: AC | PRN
Start: 2023-06-18 — End: 2023-06-18
  Administered 2023-06-18: 31.6 via INTRAVENOUS

## 2023-06-20 ENCOUNTER — Ambulatory Visit
Admission: RE | Admit: 2023-06-20 | Discharge: 2023-06-20 | Disposition: A | Discharge status: Home / Self Care | Payer: Medicare Other | Source: Ambulatory Visit | Attending: Specialist | Admitting: Specialist

## 2023-06-20 DIAGNOSIS — R918 Other nonspecific abnormal finding of lung field: Secondary | ICD-10-CM | POA: Diagnosis not present

## 2023-06-20 DIAGNOSIS — I700 Atherosclerosis of aorta: Secondary | ICD-10-CM | POA: Diagnosis not present

## 2023-06-20 DIAGNOSIS — J984 Other disorders of lung: Secondary | ICD-10-CM

## 2023-06-20 DIAGNOSIS — R911 Solitary pulmonary nodule: Secondary | ICD-10-CM | POA: Diagnosis not present

## 2023-07-05 ENCOUNTER — Encounter: Payer: Self-pay | Admitting: Cardiovascular Disease

## 2023-07-05 ENCOUNTER — Ambulatory Visit: Payer: Medicare Other | Admitting: Cardiovascular Disease

## 2023-07-05 VITALS — BP 122/76 | HR 80 | Ht 66.0 in | Wt 254.8 lb

## 2023-07-05 DIAGNOSIS — N182 Chronic kidney disease, stage 2 (mild): Secondary | ICD-10-CM | POA: Diagnosis not present

## 2023-07-05 DIAGNOSIS — G4733 Obstructive sleep apnea (adult) (pediatric): Secondary | ICD-10-CM | POA: Diagnosis not present

## 2023-07-05 DIAGNOSIS — E1142 Type 2 diabetes mellitus with diabetic polyneuropathy: Secondary | ICD-10-CM | POA: Diagnosis not present

## 2023-07-05 DIAGNOSIS — I10 Essential (primary) hypertension: Secondary | ICD-10-CM | POA: Diagnosis not present

## 2023-07-05 DIAGNOSIS — I350 Nonrheumatic aortic (valve) stenosis: Secondary | ICD-10-CM

## 2023-07-05 DIAGNOSIS — R079 Chest pain, unspecified: Secondary | ICD-10-CM | POA: Diagnosis not present

## 2023-07-05 DIAGNOSIS — I129 Hypertensive chronic kidney disease with stage 1 through stage 4 chronic kidney disease, or unspecified chronic kidney disease: Secondary | ICD-10-CM | POA: Diagnosis not present

## 2023-07-05 DIAGNOSIS — E782 Mixed hyperlipidemia: Secondary | ICD-10-CM

## 2023-07-05 NOTE — Progress Notes (Signed)
Cardiology Office Note   Date:  07/05/2023   ID:  Taylor Barry, DOB [birth year], MRN [id number]  PCP:  Margaretann Loveless, MD  Cardiologist:  Adrian Blackwater, MD      History of Present Illness: Taylor Barry is a 77 y.o. female who presents for  Chief Complaint  Patient presents with   Follow-up    NST results    Doing well      Past Medical History:  Diagnosis Date   Allergic rhinitis with postnasal drip    Anxiety and depression    Arthritis    Arthritis, multiple joint involvement    Bronchitis, acute, with bronchospasm    Hair loss    HTN, goal below 150/90    Hyperlipidemia LDL goal <100    Obstructive sleep apnea, adult    CPAP   Right adrenal mass (HCC)    Vitamin D deficiency      Past Surgical History:  Procedure Laterality Date   ABDOMINAL HYSTERECTOMY  1968   total   COLONOSCOPY WITH PROPOFOL N/A 04/15/2021   Procedure: COLONOSCOPY WITH PROPOFOL;  Surgeon: Midge Minium, MD;  Location: [facility];  Service: Endoscopy;  Laterality: N/A;  [facility] - oral meds priority 4   REPLACEMENT TOTAL KNEE Bilateral    Right knee first, Left knee more recent   TYMPANOPLASTY Right 1977     Current Outpatient Medications  Medication Sig Dispense Refill   aspirin 81 MG chewable tablet Chew by mouth.     Bempedoic Acid (NEXLETOL) 180 MG TABS Take 1 tablet (180 mg total) by mouth daily. 90 tablet 1   diazepam (VALIUM) 10 MG tablet TAKE 1 TABLET BY MOUTH EVERY DAY AS NEEDED FOR ANXIETY 30 tablet 1   fenofibrate (TRICOR) 145 MG tablet TAKE 1 TABLET BY MOUTH ONCE  DAILY 90 tablet 3   fluocinonide (LIDEX) 0.05 % external solution Apply 1-2 drops to affected areas scalp once to twice daily as needed for itch. Avoid face. 60 mL 2   ibuprofen (ADVIL) 800 MG tablet SMARTSIG:1 Tablet(s) By Mouth 1 to 3 Times Daily PRN     ketoconazole (NIZORAL) 2 % shampoo Massage into scalp 3 times a week, let sit 5 minutes, then rinse. 120 mL 3   losartan-hydrochlorothiazide  (HYZAAR) 100-12.5 MG tablet Take 1 tablet by mouth daily.     metFORMIN (GLUCOPHAGE-XR) 500 MG 24 hr tablet Take 500 mg by mouth 2 (two) times daily.     Multiple Vitamins-Minerals (MULTIVITAMIN WITH MINERALS) tablet Take 1 tablet by mouth daily.     ondansetron (ZOFRAN-ODT) 4 MG disintegrating tablet Take 4 mg by mouth every 8 (eight) hours as needed. (Patient not taking: Reported on 03/24/2021)     ONETOUCH VERIO test strip 3 (three) times daily.     pantoprazole (PROTONIX) 40 MG tablet Take 1 tablet (40 mg total) by mouth daily. 30 tablet 4   sertraline (ZOLOFT) 25 MG tablet TAKE 1 TABLET BY MOUTH EVERY DAY 90 tablet 2   spironolactone (ALDACTONE) 25 MG tablet Take 0.5 tablets (12.5 mg total) by mouth daily. 30 tablet 11   No current facility-administered medications for this visit.    Allergies:   Acetaminophen, Codeine, Hydrocodone-acetaminophen, Oxycodone hcl, Oxycodone-acetaminophen, Prednisolone, and Statins    Social History:   reports that she quit smoking about 28 years ago. She has never used smokeless tobacco. She reports that she does not drink alcohol and does not use drugs.   Family History:  family  history includes Alcohol abuse in her father; Breast cancer in her maternal aunt; Cancer in her brother, maternal grandfather, and maternal grandmother; Diabetes in her daughter; Heart disease in her mother.    ROS:     Review of Systems  Constitutional: Negative.   HENT: Negative.    Eyes: Negative.   Respiratory: Negative.    Gastrointestinal: Negative.   Genitourinary: Negative.   Musculoskeletal: Negative.   Skin: Negative.   Neurological: Negative.   Endo/Heme/Allergies: Negative.   Psychiatric/Behavioral: Negative.    All other systems reviewed and are negative.     All other systems are reviewed and negative.    PHYSICAL EXAM: VS:  BP 122/76   Pulse 80   Ht 5\' 6"  (1.676 m)   Wt 254 lb 12.8 oz (115.6 kg)   SpO2 95%   BMI 41.13 kg/m  , BMI Body mass index  is 41.13 kg/m. Last weight:  Wt Readings from Last 3 Encounters:  07/05/23 254 lb 12.8 oz (115.6 kg)  06/11/23 257 lb (116.6 kg)  05/22/23 259 lb (117.5 kg)     Physical Exam Constitutional:      Appearance: Normal appearance.  Cardiovascular:     Rate and Rhythm: Normal rate and regular rhythm.     Heart sounds: Normal heart sounds.  Pulmonary:     Effort: Pulmonary effort is normal.     Breath sounds: Normal breath sounds.  Musculoskeletal:     Right lower leg: No edema.     Left lower leg: No edema.  Neurological:     Mental Status: She is alert.       EKG:   Recent Labs: 01/26/2023: Hemoglobin 13.0 05/22/2023: ALT 15; BUN 20; Creatinine, Ser 0.97; Potassium 5.1; Sodium 137    Lipid Panel    Component Value Date/Time   CHOL 200 (H) 05/22/2023 1510   TRIG 291 (H) 05/22/2023 1510   HDL 19 (L) 05/22/2023 1510   CHOLHDL 5.0 (H) 01/26/2023 1313   LDLCALC 129 (H) 05/22/2023 1510      Other studies Reviewed: Additional studies/ records that were reviewed today include:  Review of the above records demonstrates:       No data to display            ASSESSMENT AND PLAN:    ICD-10-CM   1. Benign hypertension with CKD (chronic kidney disease), stage II  I12.9    N18.2    normal BUN creat now    2. Essential hypertension, benign  I10    stable    3. Obstructive sleep apnea of adult  G47.33     4. Diabetic peripheral neuropathy (HCC)  E11.42     5. Chest pain, unspecified type  R07.9    stress test normal, normal EF    6. Mixed hyperlipidemia  E78.2     7. Nonrheumatic aortic valve stenosis  I35.0    mild aortic stenosis, no intervention needed       Problem List Items Addressed This Visit       Cardiovascular and Mediastinum   Benign hypertension with CKD (chronic kidney disease), stage II - Primary   Essential hypertension, benign     Respiratory   Obstructive sleep apnea of adult     Endocrine   Diabetic peripheral neuropathy (HCC)      Other   Mixed hyperlipidemia   Chest pain   Other Visit Diagnoses     Nonrheumatic aortic valve stenosis       mild  aortic stenosis, no intervention needed          Disposition:   Return in about 3 months (around 10/05/2023).    Total time spent: 30 minutes  Signed,  Adrian Blackwater, MD  07/05/2023 10:50 AM    [facility]

## 2023-07-06 DIAGNOSIS — G4733 Obstructive sleep apnea (adult) (pediatric): Secondary | ICD-10-CM | POA: Diagnosis not present

## 2023-07-06 DIAGNOSIS — R918 Other nonspecific abnormal finding of lung field: Secondary | ICD-10-CM | POA: Diagnosis not present

## 2023-07-06 DIAGNOSIS — R0609 Other forms of dyspnea: Secondary | ICD-10-CM | POA: Diagnosis not present

## 2023-07-06 DIAGNOSIS — E278 Other specified disorders of adrenal gland: Secondary | ICD-10-CM | POA: Diagnosis not present

## 2023-07-07 DIAGNOSIS — J984 Other disorders of lung: Secondary | ICD-10-CM | POA: Diagnosis not present

## 2023-07-07 DIAGNOSIS — R918 Other nonspecific abnormal finding of lung field: Secondary | ICD-10-CM | POA: Diagnosis not present

## 2023-07-16 ENCOUNTER — Telehealth: Payer: Self-pay | Admitting: Internal Medicine

## 2023-07-16 NOTE — Telephone Encounter (Signed)
Patient's daughter called wanting to stop by and pick up some Nexletol samples. We do have some samples she can come pick up. Please call [name] and let her know.

## 2023-07-17 ENCOUNTER — Telehealth: Payer: Self-pay

## 2023-07-17 NOTE — Telephone Encounter (Signed)
Pt daughter contacted office requesting samples for Nexletol. Daughter was informed that 2 samples are available for pick up; daughter verbalized understanding.

## 2023-07-24 DIAGNOSIS — B029 Zoster without complications: Secondary | ICD-10-CM | POA: Diagnosis not present

## 2023-07-24 DIAGNOSIS — R0789 Other chest pain: Secondary | ICD-10-CM | POA: Diagnosis not present

## 2023-07-24 DIAGNOSIS — J984 Other disorders of lung: Secondary | ICD-10-CM | POA: Diagnosis not present

## 2023-07-30 ENCOUNTER — Other Ambulatory Visit: Payer: Self-pay | Admitting: Internal Medicine

## 2023-07-30 DIAGNOSIS — I10 Essential (primary) hypertension: Secondary | ICD-10-CM

## 2023-07-31 ENCOUNTER — Telehealth: Payer: Self-pay | Admitting: Internal Medicine

## 2023-07-31 NOTE — Telephone Encounter (Signed)
Patient's daughter left VM needing samples of Nexletol since her insurance will not cover it. Samples put to the side downstairs for patient and daughter notified.

## 2023-08-16 DIAGNOSIS — E278 Other specified disorders of adrenal gland: Secondary | ICD-10-CM | POA: Diagnosis not present

## 2023-08-16 DIAGNOSIS — R0609 Other forms of dyspnea: Secondary | ICD-10-CM | POA: Diagnosis not present

## 2023-08-16 DIAGNOSIS — G4733 Obstructive sleep apnea (adult) (pediatric): Secondary | ICD-10-CM | POA: Diagnosis not present

## 2023-08-16 DIAGNOSIS — R918 Other nonspecific abnormal finding of lung field: Secondary | ICD-10-CM | POA: Diagnosis not present

## 2023-08-16 DIAGNOSIS — J984 Other disorders of lung: Secondary | ICD-10-CM | POA: Diagnosis not present

## 2023-08-17 ENCOUNTER — Ambulatory Visit: Payer: Medicare Other | Admitting: Internal Medicine

## 2023-08-17 ENCOUNTER — Encounter: Payer: Self-pay | Admitting: Internal Medicine

## 2023-08-17 VITALS — BP 136/78 | HR 75 | Ht 66.0 in | Wt 256.6 lb

## 2023-08-17 DIAGNOSIS — B0229 Other postherpetic nervous system involvement: Secondary | ICD-10-CM | POA: Diagnosis not present

## 2023-08-17 DIAGNOSIS — E1165 Type 2 diabetes mellitus with hyperglycemia: Secondary | ICD-10-CM | POA: Diagnosis not present

## 2023-08-17 DIAGNOSIS — E782 Mixed hyperlipidemia: Secondary | ICD-10-CM | POA: Diagnosis not present

## 2023-08-17 DIAGNOSIS — I10 Essential (primary) hypertension: Secondary | ICD-10-CM | POA: Diagnosis not present

## 2023-08-17 LAB — POCT CBG (FASTING - GLUCOSE)-MANUAL ENTRY: Glucose Fasting, POC: 129 mg/dL — AB (ref 70–99)

## 2023-08-17 MED ORDER — PREGABALIN 25 MG PO CAPS
25.0000 mg | ORAL_CAPSULE | Freq: Two times a day (BID) | ORAL | 1 refills | Status: DC
Start: 2023-08-17 — End: 2023-11-06

## 2023-08-17 MED ORDER — HYDROXYZINE HCL 10 MG PO TABS
10.0000 mg | ORAL_TABLET | Freq: Three times a day (TID) | ORAL | 0 refills | Status: DC | PRN
Start: 2023-08-17 — End: 2024-05-08

## 2023-08-17 NOTE — Progress Notes (Signed)
Established Patient Office Visit  Subjective:  Patient ID: Taylor Barry, female    DOB: [birth year]  Age: 77 y.o. MRN: [id number]  Chief Complaint  Patient presents with   Follow-up    3 month follow up    Patient comes in for her follow-up today accompanied by her daughter.  She was diagnosed with herpes zoster on her left chest wall on July 24, 2023 and prescribed Valtrex for a week which she has completed.  However she continues to have sharp burning pain in that area although the rash is almost completely resolved.  She also has itchy but she tries not to scratch.  Has difficulty sleeping at night.  Patient cannot tolerate gabapentin, but agrees to try Lyrica.  Will send in prescription for 25 mg twice a day.  And add a small dose of hydroxyzine at bedtime. We will check fasting blood work 2 days prior to her next appointment 10 days.    No other concerns at this time.   Past Medical History:  Diagnosis Date   Allergic rhinitis with postnasal drip    Anxiety and depression    Arthritis    Arthritis, multiple joint involvement    Bronchitis, acute, with bronchospasm    Hair loss    HTN, goal below 150/90    Hyperlipidemia LDL goal <100    Obstructive sleep apnea, adult    CPAP   Right adrenal mass (HCC)    Vitamin D deficiency     Past Surgical History:  Procedure Laterality Date   ABDOMINAL HYSTERECTOMY  1968   total   COLONOSCOPY WITH PROPOFOL N/A 04/15/2021   Procedure: COLONOSCOPY WITH PROPOFOL;  Surgeon: Midge Minium, MD;  Location: [facility];  Service: Endoscopy;  Laterality: N/A;  Daibetic - oral meds priority 4   REPLACEMENT TOTAL KNEE Bilateral    Right knee first, Left knee more recent   TYMPANOPLASTY Right 1977    Social History   Socioeconomic History   Marital status: Divorced    Spouse name: Not on file   Number of children: Not on file   Years of education: Not on file   Highest education level: Not on file  Occupational History   Not  on file  Tobacco Use   Smoking status: Former    Current packs/day: 0.00    Types: Cigarettes    Quit date: 01/25/1995    Years since quitting: 28.5   Smokeless tobacco: Never   Tobacco comments:    social smoker as teenager  Vaping Use   Vaping status: Never Used  Substance and Sexual Activity   Alcohol use: No    Alcohol/week: 0.0 standard drinks of alcohol   Drug use: No   Sexual activity: Never  Other Topics Concern   Not on file  Social History Narrative   Not on file   Social Determinants of Health   Financial Resource Strain: Low Risk  (08/16/2023)   Received from [facility]   Overall Financial Resource Strain (CARDIA)    Difficulty of Paying Living Expenses: Not hard at all  Food Insecurity: No Food Insecurity (08/16/2023)   Received from [facility]   Hunger Vital Sign    Worried About Running Out of Food in the Last Year: Never true    Ran Out of Food in the Last Year: Never true  Transportation Needs: No Transportation Needs (08/16/2023)   Received from [facility]   [facility] - Transportation  In the past 12 months, has lack of transportation kept you from medical appointments or from getting medications?: No    Lack of Transportation (Non-Medical): No  Physical Activity: Not on file  Stress: Not on file  Social Connections: Unknown (05/09/2022)   Received from [facility]   Social Network    Social Network: Not on file  Intimate Partner Violence: Unknown (03/31/2022)   Received from [facility]   HITS    Physically Hurt: Not on file    Insult or Talk Down To: Not on file    Threaten Physical Harm: Not on file    Scream or Curse: Not on file    Family History  Problem Relation Age of Onset   Heart disease Mother    Alcohol abuse Father    Diabetes Daughter    Breast cancer Maternal Aunt    Cancer Brother    Cancer Maternal Grandmother    Cancer Maternal Grandfather      Allergies  Allergen Reactions   Acetaminophen     Pt denies   Codeine Nausea Only   Hydrocodone-Acetaminophen Other (See Comments)    "Puts me up the wall"   Oxycodone Hcl    Oxycodone-Acetaminophen     Hallucination   Prednisolone     Hallucination   Statins     Review of Systems  Constitutional:  Positive for malaise/fatigue. Negative for chills, diaphoresis, fever and weight loss.  HENT: Negative.  Negative for sinus pain and sore throat.   Eyes: Negative.   Respiratory: Negative.  Negative for cough, shortness of breath and stridor.   Cardiovascular: Negative.  Negative for chest pain, palpitations and leg swelling.  Gastrointestinal: Negative.  Negative for abdominal pain, constipation, diarrhea, heartburn, nausea and vomiting.  Genitourinary: Negative.  Negative for dysuria and flank pain.  Musculoskeletal:  Positive for back pain. Negative for joint pain and myalgias.  Skin:  Positive for itching and rash.  Neurological: Negative.  Negative for dizziness and headaches.  Endo/Heme/Allergies: Negative.   Psychiatric/Behavioral: Negative.  Negative for depression and suicidal ideas. The patient is not nervous/anxious.        Objective:   BP 136/78   Pulse 75   Ht 5\' 6"  (1.676 m)   Wt 256 lb 9.6 oz (116.4 kg)   SpO2 95%   BMI 41.42 kg/m   Vitals:   08/17/23 1323  BP: 136/78  Pulse: 75  Height: 5\' 6"  (1.676 m)  Weight: 256 lb 9.6 oz (116.4 kg)  SpO2: 95%  BMI (Calculated): 41.44    Physical Exam Vitals and nursing note reviewed.  Constitutional:      Appearance: Normal appearance.  HENT:     Head: Normocephalic and atraumatic.     Nose: Nose normal.     Mouth/Throat:     Mouth: Mucous membranes are moist.     Pharynx: Oropharynx is clear.  Eyes:     Conjunctiva/sclera: Conjunctivae normal.     Pupils: Pupils are equal, round, and reactive to light.  Cardiovascular:     Rate and Rhythm: Normal rate and regular rhythm.     Pulses: Normal  pulses.     Heart sounds: Normal heart sounds. No murmur heard. Pulmonary:     Effort: Pulmonary effort is normal.     Breath sounds: Normal breath sounds. No wheezing.  Abdominal:     General: Bowel sounds are normal.     Palpations: Abdomen is soft.     Tenderness: There  is no abdominal tenderness. There is no right CVA tenderness or left CVA tenderness.  Musculoskeletal:        General: Normal range of motion.     Cervical back: Normal range of motion.     Right lower leg: No edema.     Left lower leg: No edema.  Skin:    General: Skin is warm and dry.     Findings: Rash present.  Neurological:     General: No focal deficit present.     Mental Status: She is alert and oriented to person, place, and time.  Psychiatric:        Mood and Affect: Mood normal.        Behavior: Behavior normal.     Results for orders placed or performed in visit on 08/17/23  POCT CBG (Fasting - Glucose)  Result Value Ref Range   Glucose Fasting, POC 129 (A) 70 - 99 mg/dL        Assessment & Plan:  Start Lyrica for postherpetic neuralgia. Fasting labs before next visit. Problem List Items Addressed This Visit     Mixed hyperlipidemia   Relevant Orders   Lipid Panel w/o Chol/HDL Ratio   Type 2 diabetes mellitus with hyperglycemia, without long-term current use of insulin (HCC) - Primary   Relevant Orders   POCT CBG (Fasting - Glucose) (Completed)   Hemoglobin A1c   Essential hypertension, benign   Relevant Orders   CBC with Diff   CMP14+EGFR   Other Visit Diagnoses     Post herpetic neuralgia       Relevant Medications   hydrOXYzine (ATARAX) 10 MG tablet   pregabalin (LYRICA) 25 MG capsule   Other Relevant Orders   CBC with Diff       Return in about 10 days (around 08/27/2023).   Total time spent: 25 minutes  Margaretann Loveless, MD  08/17/2023   This document may have been prepared by [facility] Voice Recognition software and as such may include unintentional dictation errors.

## 2023-08-23 ENCOUNTER — Ambulatory Visit: Payer: Medicare Other | Admitting: Internal Medicine

## 2023-08-28 ENCOUNTER — Other Ambulatory Visit: Payer: Medicare Other

## 2023-08-28 DIAGNOSIS — B0229 Other postherpetic nervous system involvement: Secondary | ICD-10-CM

## 2023-08-28 DIAGNOSIS — E782 Mixed hyperlipidemia: Secondary | ICD-10-CM | POA: Diagnosis not present

## 2023-08-28 DIAGNOSIS — I10 Essential (primary) hypertension: Secondary | ICD-10-CM

## 2023-08-28 DIAGNOSIS — E1165 Type 2 diabetes mellitus with hyperglycemia: Secondary | ICD-10-CM | POA: Diagnosis not present

## 2023-08-29 LAB — CBC WITH DIFFERENTIAL/PLATELET
Basophils Absolute: 0.1 10*3/uL (ref 0.0–0.2)
Basos: 1 %
EOS (ABSOLUTE): 0.2 10*3/uL (ref 0.0–0.4)
Eos: 4 %
Hematocrit: 37.5 % (ref 34.0–46.6)
Hemoglobin: 12.6 g/dL (ref 11.1–15.9)
Immature Grans (Abs): 0 10*3/uL (ref 0.0–0.1)
Immature Granulocytes: 0 %
Lymphocytes Absolute: 1.6 10*3/uL (ref 0.7–3.1)
Lymphs: 33 %
MCH: 32 pg (ref 26.6–33.0)
MCHC: 33.6 g/dL (ref 31.5–35.7)
MCV: 95 fL (ref 79–97)
Monocytes Absolute: 0.4 10*3/uL (ref 0.1–0.9)
Monocytes: 7 %
Neutrophils Absolute: 2.7 10*3/uL (ref 1.4–7.0)
Neutrophils: 55 %
Platelets: 311 10*3/uL (ref 150–450)
RBC: 3.94 x10E6/uL (ref 3.77–5.28)
RDW: 13.2 % (ref 11.7–15.4)
WBC: 4.9 10*3/uL (ref 3.4–10.8)

## 2023-08-29 LAB — CMP14+EGFR
ALT: 15 IU/L (ref 0–32)
AST: 24 IU/L (ref 0–40)
Albumin: 4.4 g/dL (ref 3.8–4.8)
Alkaline Phosphatase: 70 IU/L (ref 44–121)
BUN/Creatinine Ratio: 19 (ref 12–28)
BUN: 20 mg/dL (ref 8–27)
Bilirubin Total: 0.2 mg/dL (ref 0.0–1.2)
CO2: 20 mmol/L (ref 20–29)
Calcium: 10.4 mg/dL — ABNORMAL HIGH (ref 8.7–10.3)
Chloride: 100 mmol/L (ref 96–106)
Creatinine, Ser: 1.04 mg/dL — ABNORMAL HIGH (ref 0.57–1.00)
Globulin, Total: 2.9 g/dL (ref 1.5–4.5)
Glucose: 167 mg/dL — ABNORMAL HIGH (ref 70–99)
Potassium: 5.5 mmol/L — ABNORMAL HIGH (ref 3.5–5.2)
Sodium: 138 mmol/L (ref 134–144)
Total Protein: 7.3 g/dL (ref 6.0–8.5)
eGFR: 55 mL/min/{1.73_m2} — ABNORMAL LOW (ref >59)

## 2023-08-29 LAB — LIPID PANEL W/O CHOL/HDL RATIO
Cholesterol, Total: 241 mg/dL — ABNORMAL HIGH (ref 100–199)
HDL: 14 mg/dL — ABNORMAL LOW (ref >39)
LDL Chol Calc (NIH): 126 mg/dL — ABNORMAL HIGH (ref 0–99)
Triglycerides: 556 mg/dL (ref 0–149)
VLDL Cholesterol Cal: 101 mg/dL — ABNORMAL HIGH (ref 5–40)

## 2023-08-29 LAB — HEMOGLOBIN A1C
Est. average glucose Bld gHb Est-mCnc: 157 mg/dL
Hgb A1c MFr Bld: 7.1 % — ABNORMAL HIGH (ref 4.8–5.6)

## 2023-08-30 ENCOUNTER — Ambulatory Visit (INDEPENDENT_AMBULATORY_CARE_PROVIDER_SITE_OTHER): Payer: Medicare Other | Admitting: Internal Medicine

## 2023-08-30 ENCOUNTER — Encounter: Payer: Self-pay | Admitting: Internal Medicine

## 2023-08-30 VITALS — BP 130/70 | HR 81 | Ht 66.0 in | Wt 257.4 lb

## 2023-08-30 DIAGNOSIS — G8929 Other chronic pain: Secondary | ICD-10-CM

## 2023-08-30 DIAGNOSIS — M25511 Pain in right shoulder: Secondary | ICD-10-CM

## 2023-08-30 DIAGNOSIS — E1165 Type 2 diabetes mellitus with hyperglycemia: Secondary | ICD-10-CM

## 2023-08-30 DIAGNOSIS — M7501 Adhesive capsulitis of right shoulder: Secondary | ICD-10-CM | POA: Insufficient documentation

## 2023-08-30 DIAGNOSIS — E782 Mixed hyperlipidemia: Secondary | ICD-10-CM

## 2023-08-30 LAB — POCT CBG (FASTING - GLUCOSE)-MANUAL ENTRY: Glucose Fasting, POC: 152 mg/dL — AB (ref 70–99)

## 2023-08-30 MED ORDER — EMPAGLIFLOZIN 25 MG PO TABS: 25.0000 mg | ORAL_TABLET | Freq: Every day | ORAL | 6 refills | Status: DC

## 2023-08-30 NOTE — Progress Notes (Signed)
Established Patient Office Visit  Subjective:  Patient ID: Taylor Barry, female    DOB: 04-23-1946  Age: 77 y.o. MRN: [id number]  Chief Complaint  Patient presents with   Follow-up    10 day follow up    Patient comes in for a follow-up accompanied by her daughter.  She had labs done 2 days ago , which showed a very high triglyceride level.  Patient admits to eat eating hot dogs over the weekend.  She is currently on fenofibrate which she is able to take regularly.  However we will get another 12-hour fasting lipid panel next week.  Patient is also currently using samples of Nexletol and her LDL is looking better.  However her hemoglobin A1c is higher than before.  She is only on metformin 500 mg once a day due to her creatinine being above normal.  Will add Jardiance 25 mg once a day.  Patient advised to follow-up very strict low-fat low-carb diet. Mentions pain and reduced range of motion in her right shoulder.  Will get an orthopedic referral for right adhesive capsulitis.    No other concerns at this time.   Past Medical History:  Diagnosis Date   Allergic rhinitis with postnasal drip    Anxiety and depression    Arthritis    Arthritis, multiple joint involvement    Bronchitis, acute, with bronchospasm    Hair loss    HTN, goal below 150/90    Hyperlipidemia LDL goal <100    Obstructive sleep apnea, adult    CPAP   Right adrenal mass (HCC)    Vitamin D deficiency     Past Surgical History:  Procedure Laterality Date   ABDOMINAL HYSTERECTOMY  1968   total   COLONOSCOPY WITH PROPOFOL N/A 04/15/2021   Procedure: COLONOSCOPY WITH PROPOFOL;  Surgeon: Midge Minium, MD;  Location: [facility];  Service: Endoscopy;  Laterality: N/A;  Daibetic - oral meds priority 4   REPLACEMENT TOTAL KNEE Bilateral    Right knee first, Left knee more recent   TYMPANOPLASTY Right 1977    Social History   Socioeconomic History   Marital status: Divorced    Spouse name: Not on  file   Number of children: Not on file   Years of education: Not on file   Highest education level: Not on file  Occupational History   Not on file  Tobacco Use   Smoking status: Former    Current packs/day: 0.00    Types: Cigarettes    Quit date: 01/25/1995    Years since quitting: 28.6   Smokeless tobacco: Never   Tobacco comments:    social smoker as teenager  Vaping Use   Vaping status: Never Used  Substance and Sexual Activity   Alcohol use: No    Alcohol/week: 0.0 standard drinks of alcohol   Drug use: No   Sexual activity: Never  Other Topics Concern   Not on file  Social History Narrative   Not on file   Social Determinants of Health   Financial Resource Strain: Low Risk  (08/16/2023)   Received from [facility]   Overall Financial Resource Strain (CARDIA)    Difficulty of Paying Living Expenses: Not hard at all  Food Insecurity: No Food Insecurity (08/16/2023)   [facility]   Hunger Vital Sign    Worried About Running Out of Food in the Last Year: Never true    Ran Out of Food in  the Last Year: Never true  Transportation Needs: No Transportation Needs (08/16/2023)   Received from [facility] - Transportation    In the past 12 months, has lack of transportation kept you from medical appointments or from getting medications?: No    Lack of Transportation (Non-Medical): No  Physical Activity: Not on file  Stress: Not on file  Social Connections: Unknown (05/09/2022)   Received from [facility]   Social Network    Social Network: Not on file  Intimate Partner Violence: Unknown (03/31/2022)   Received from [facility]   HITS    Physically Hurt: Not on file    Insult or Talk Down To: Not on file    Threaten Physical Harm: Not on file    Scream or Curse: Not on file    Family History  Problem Relation Age of Onset   Heart disease Mother    Alcohol  abuse Father    Diabetes Daughter    Breast cancer Maternal Aunt    Cancer Brother    Cancer Maternal Grandmother    Cancer Maternal Grandfather     Allergies  Allergen Reactions   Acetaminophen     Pt denies   Codeine Nausea Only   Hydrocodone-Acetaminophen Other (See Comments)    "Puts me up the wall"   Oxycodone Hcl    Oxycodone-Acetaminophen     Hallucination   Prednisolone     Hallucination   Statins     Review of Systems  Constitutional: Negative.  Negative for chills, fever, malaise/fatigue and weight loss.  HENT: Negative.    Eyes: Negative.   Respiratory: Negative.  Negative for cough and shortness of breath.   Cardiovascular: Negative.  Negative for chest pain, palpitations and leg swelling.  Gastrointestinal: Negative.  Negative for abdominal pain, constipation, diarrhea, heartburn, nausea and vomiting.  Genitourinary: Negative.  Negative for dysuria and flank pain.  Musculoskeletal:  Positive for joint pain (right shoulder). Negative for myalgias.  Skin: Negative.   Neurological: Negative.  Negative for dizziness and headaches.  Endo/Heme/Allergies: Negative.   Psychiatric/Behavioral: Negative.  Negative for depression and suicidal ideas. The patient is not nervous/anxious.        Objective:   BP 130/70   Pulse 81   Ht 5\' 6"  (1.676 m)   Wt 257 lb 6.4 oz (116.8 kg)   SpO2 93%   BMI 41.55 kg/m   Vitals:   08/30/23 1129  BP: 130/70  Pulse: 81  Height: 5\' 6"  (1.676 m)  Weight: 257 lb 6.4 oz (116.8 kg)  SpO2: 93%  BMI (Calculated): 41.57    Physical Exam Vitals and nursing note reviewed.  Constitutional:      Appearance: Normal appearance.  HENT:     Head: Normocephalic and atraumatic.     Nose: Nose normal.     Mouth/Throat:     Mouth: Mucous membranes are moist.     Pharynx: Oropharynx is clear.  Eyes:     Conjunctiva/sclera: Conjunctivae normal.     Pupils: Pupils are equal, round, and reactive to light.  Cardiovascular:     Rate and  Rhythm: Normal rate and regular rhythm.     Pulses: Normal pulses.     Heart sounds: Normal heart sounds. No murmur heard. Pulmonary:     Effort: Pulmonary effort is normal.     Breath sounds: Normal breath sounds. No wheezing.  Abdominal:     General: Bowel sounds are normal.  Palpations: Abdomen is soft.     Tenderness: There is no abdominal tenderness. There is no right CVA tenderness or left CVA tenderness.  Musculoskeletal:        General: Normal range of motion.     Cervical back: Normal range of motion.     Right lower leg: No edema.     Left lower leg: No edema.  Skin:    General: Skin is warm and dry.  Neurological:     General: No focal deficit present.     Mental Status: She is alert and oriented to person, place, and time.  Psychiatric:        Mood and Affect: Mood normal.        Behavior: Behavior normal.      Results for orders placed or performed in visit on 08/30/23  POCT CBG (Fasting - Glucose)  Result Value Ref Range   Glucose Fasting, POC 152 (A) 70 - 99 mg/dL    Recent Results (from the past 2160 hour(s))  POCT CBG (Fasting - Glucose)     Status: Abnormal   Collection Time: 08/17/23  1:29 PM  Result Value Ref Range   Glucose Fasting, POC 129 (A) 70 - 99 mg/dL  Hemoglobin W0J     Status: Abnormal   Collection Time: 08/28/23  8:44 AM  Result Value Ref Range   Hgb A1c MFr Bld 7.1 (H) 4.8 - 5.6 %    Comment:          Prediabetes: 5.7 - 6.4          Diabetes: >6.4          Glycemic control for adults with diabetes: <7.0    Est. average glucose Bld gHb Est-mCnc 157 mg/dL  [id number]     Status: Abnormal   Collection Time: 08/28/23  8:44 AM  Result Value Ref Range   Glucose 167 (H) 70 - 99 mg/dL   BUN 20 8 - 27 mg/dL   Creatinine, Ser 9.56 (H) 0.57 - 1.00 mg/dL   eGFR 55 (L) >21 HY/QMV/7.84   BUN/Creatinine Ratio 19 12 - 28   Sodium 138 134 - 144 mmol/L   Potassium 5.5 (H) 3.5 - 5.2 mmol/L   Chloride 100 96 - 106 mmol/L   CO2 20 20 - 29 mmol/L    Calcium 10.4 (H) 8.7 - 10.3 mg/dL   Total Protein 7.3 6.0 - 8.5 g/dL   Albumin 4.4 3.8 - 4.8 g/dL   Globulin, Total 2.9 1.5 - 4.5 g/dL   Bilirubin Total 0.2 0.0 - 1.2 mg/dL   Alkaline Phosphatase 70 44 - 121 IU/L   AST 24 0 - 40 IU/L   ALT 15 0 - 32 IU/L  Lipid Panel w/o Chol/HDL Ratio     Status: Abnormal   Collection Time: 08/28/23  8:44 AM  Result Value Ref Range   Cholesterol, Total 241 (H) 100 - 199 mg/dL   Triglycerides 696 (HH) 0 - 149 mg/dL   HDL 14 (L) >29 mg/dL   VLDL Cholesterol Cal 101 (H) 5 - 40 mg/dL   LDL Chol Calc (NIH) 528 (H) 0 - 99 mg/dL  CBC with Diff     Status: None   Collection Time: 08/28/23  8:44 AM  Result Value Ref Range   WBC 4.9 3.4 - 10.8 x10E3/uL   RBC 3.94 3.77 - 5.28 x10E6/uL   Hemoglobin 12.6 11.1 - 15.9 g/dL   Hematocrit 41.3 24.4 - 46.6 %   MCV 95 79 -  97 fL   MCH 32.0 26.6 - 33.0 pg   MCHC 33.6 31.5 - 35.7 g/dL   RDW 69.6 29.5 - 28.4 %   Platelets 311 150 - 450 x10E3/uL   Neutrophils 55 Not Estab. %   Lymphs 33 Not Estab. %   Monocytes 7 Not Estab. %   Eos 4 Not Estab. %   Basos 1 Not Estab. %   Neutrophils Absolute 2.7 1.4 - 7.0 x10E3/uL   Lymphocytes Absolute 1.6 0.7 - 3.1 x10E3/uL   Monocytes Absolute 0.4 0.1 - 0.9 x10E3/uL   EOS (ABSOLUTE) 0.2 0.0 - 0.4 x10E3/uL   Basophils Absolute 0.1 0.0 - 0.2 x10E3/uL   Immature Granulocytes 0 Not Estab. %   Immature Grans (Abs) 0.0 0.0 - 0.1 x10E3/uL  POCT CBG (Fasting - Glucose)     Status: Abnormal   Collection Time: 08/30/23 11:33 AM  Result Value Ref Range   Glucose Fasting, POC 152 (A) 70 - 99 mg/dL      Assessment & Plan:  Continue current medications.  Patient will get repeat fasting lipid panel and med to be next week. Add Jardiance 25 mg/day.  Orthopedic referral for right shoulder pain. Problem List Items Addressed This Visit     Mixed hyperlipidemia   Relevant Orders   Basic metabolic panel   Lipid Panel w/o Chol/HDL Ratio   Type 2 diabetes mellitus with hyperglycemia,  without long-term current use of insulin (HCC) - Primary   Relevant Medications   empagliflozin (JARDIANCE) 25 MG TABS tablet   Other Relevant Orders   POCT CBG (Fasting - Glucose) (Completed)   Adhesive capsulitis of right shoulder   Other Visit Diagnoses     Chronic right shoulder pain       Relevant Orders   Ambulatory referral to Orthopedic Surgery       Return in about 2 weeks (around 09/13/2023).   Total time spent: 30 minutes  Margaretann Loveless, MD  08/30/2023   This document may have been prepared by [facility] Voice Recognition software and as such may include unintentional dictation errors.

## 2023-09-18 ENCOUNTER — Other Ambulatory Visit: Payer: Medicare Other

## 2023-09-18 DIAGNOSIS — E782 Mixed hyperlipidemia: Secondary | ICD-10-CM | POA: Diagnosis not present

## 2023-09-19 LAB — BASIC METABOLIC PANEL
BUN/Creatinine Ratio: 18 (ref 12–28)
BUN: 21 mg/dL (ref 8–27)
CO2: 19 mmol/L — ABNORMAL LOW (ref 20–29)
Calcium: 10.6 mg/dL — ABNORMAL HIGH (ref 8.7–10.3)
Chloride: 104 mmol/L (ref 96–106)
Creatinine, Ser: 1.14 mg/dL — ABNORMAL HIGH (ref 0.57–1.00)
Glucose: 140 mg/dL — ABNORMAL HIGH (ref 70–99)
Potassium: 5 mmol/L (ref 3.5–5.2)
Sodium: 140 mmol/L (ref 134–144)
eGFR: 50 mL/min/{1.73_m2} — ABNORMAL LOW (ref >59)

## 2023-09-19 LAB — LIPID PANEL W/O CHOL/HDL RATIO
Cholesterol, Total: 184 mg/dL (ref 100–199)
HDL: 17 mg/dL — ABNORMAL LOW (ref >39)
LDL Chol Calc (NIH): 108 mg/dL — ABNORMAL HIGH (ref 0–99)
Triglycerides: 340 mg/dL — ABNORMAL HIGH (ref 0–149)
VLDL Cholesterol Cal: 59 mg/dL — ABNORMAL HIGH (ref 5–40)

## 2023-09-20 ENCOUNTER — Other Ambulatory Visit: Payer: Self-pay | Admitting: Family

## 2023-09-20 ENCOUNTER — Ambulatory Visit (INDEPENDENT_AMBULATORY_CARE_PROVIDER_SITE_OTHER): Payer: Medicare Other | Admitting: Internal Medicine

## 2023-09-20 ENCOUNTER — Encounter: Payer: Self-pay | Admitting: Internal Medicine

## 2023-09-20 VITALS — BP 130/68 | HR 80 | Ht 66.0 in | Wt 251.0 lb

## 2023-09-20 DIAGNOSIS — F32A Depression, unspecified: Secondary | ICD-10-CM

## 2023-09-20 DIAGNOSIS — Z23 Encounter for immunization: Secondary | ICD-10-CM | POA: Diagnosis not present

## 2023-09-20 DIAGNOSIS — I10 Essential (primary) hypertension: Secondary | ICD-10-CM

## 2023-09-20 DIAGNOSIS — F419 Anxiety disorder, unspecified: Secondary | ICD-10-CM

## 2023-09-20 DIAGNOSIS — E782 Mixed hyperlipidemia: Secondary | ICD-10-CM | POA: Diagnosis not present

## 2023-09-20 DIAGNOSIS — K219 Gastro-esophageal reflux disease without esophagitis: Secondary | ICD-10-CM | POA: Diagnosis not present

## 2023-09-20 DIAGNOSIS — E1165 Type 2 diabetes mellitus with hyperglycemia: Secondary | ICD-10-CM

## 2023-09-20 LAB — POCT CBG (FASTING - GLUCOSE)-MANUAL ENTRY: Glucose Fasting, POC: 134 mg/dL — AB (ref 70–99)

## 2023-09-20 NOTE — Progress Notes (Signed)
Established Patient Office Visit  Subjective:  Patient ID: Taylor Barry, female    DOB: [birth year]  Age: 77 y.o. MRN: [id number]  Chief Complaint  Patient presents with   Follow-up    3 week follow up    Patient is here for follow-up accompanied by her daughter.  She is having some dental issues and has difficulty chewing and eating, has lost some weight.  She is also taking and tolerating Jardiance and fenofibrate. Needs to resume Zoloft. Labs discussed at visit today.    No other concerns at this time.   Past Medical History:  Diagnosis Date   Allergic rhinitis with postnasal drip    Anxiety and depression    Arthritis    Arthritis, multiple joint involvement    Bronchitis, acute, with bronchospasm    Hair loss    HTN, goal below 150/90    Hyperlipidemia LDL goal <100    Obstructive sleep apnea, adult    CPAP   Right adrenal mass (HCC)    Vitamin D deficiency     Past Surgical History:  Procedure Laterality Date   ABDOMINAL HYSTERECTOMY  1968   total   COLONOSCOPY WITH PROPOFOL N/A 04/15/2021   Procedure: COLONOSCOPY WITH PROPOFOL;  Surgeon: Midge Minium, MD;  Location: [facility];  Service: Endoscopy;  Laterality: N/A;  [employer] - oral meds priority 4   REPLACEMENT TOTAL KNEE Bilateral    Right knee first, Left knee more recent   TYMPANOPLASTY Right 1977    Social History   Socioeconomic History   Marital status: Divorced    Spouse name: Not on file   Number of children: Not on file   Years of education: Not on file   Highest education level: Not on file  Occupational History   Not on file  Tobacco Use   Smoking status: Former    Current packs/day: 0.00    Types: Cigarettes    Quit date: 01/25/1995    Years since quitting: 28.6   Smokeless tobacco: Never   Tobacco comments:    social smoker as teenager  Vaping Use   Vaping status: Never Used  Substance and Sexual Activity   Alcohol use: No    Alcohol/week: 0.0 standard drinks of alcohol    Drug use: No   Sexual activity: Never  Other Topics Concern   Not on file  Social History Narrative   Not on file   Social Determinants of Health   Financial Resource Strain: Low Risk  (08/16/2023)   Received from [facility]   Overall Financial Resource Strain (CARDIA)    Difficulty of Paying Living Expenses: Not hard at all  Food Insecurity: No Food Insecurity (08/16/2023)   Received from [facility]   Hunger Vital Sign    Worried About Running Out of Food in the Last Year: Never true    Ran Out of Food in the Last Year: Never true  Transportation Needs: No Transportation Needs (08/16/2023)   Received from [facility]   PRAPARE - Transportation    In the past 12 months, has lack of transportation kept you from medical appointments or from getting medications?: No    Lack of Transportation (Non-Medical): No  Physical Activity: Not on file  Stress: Not on file  Social Connections: Unknown (05/09/2022)   Received from [facility]   Social Network    Social Network: Not on file  Intimate Partner Violence: Unknown (03/31/2022)  Received from [facility]   HITS    Physically Hurt: Not on file    Insult or Talk Down To: Not on file    Threaten Physical Harm: Not on file    Scream or Curse: Not on file    Family History  Problem Relation Age of Onset   Heart disease Mother    Alcohol abuse Father    Diabetes Daughter    Breast cancer Maternal Aunt    Cancer Brother    Cancer Maternal Grandmother    Cancer Maternal Grandfather     Allergies  Allergen Reactions   Acetaminophen     Pt denies   Codeine Nausea Only   Hydrocodone-Acetaminophen Other (See Comments)    "Puts me up the wall"   Oxycodone Hcl    Oxycodone-Acetaminophen     Hallucination   Prednisolone     Hallucination   Statins     Review of Systems  Constitutional: Negative.  Negative for chills, diaphoresis, fever,  malaise/fatigue and weight loss.  HENT: Negative.    Eyes: Negative.   Respiratory: Negative.  Negative for cough and shortness of breath.   Cardiovascular: Negative.  Negative for chest pain, palpitations and leg swelling.  Gastrointestinal: Negative.  Negative for abdominal pain, constipation, diarrhea, heartburn, nausea and vomiting.  Genitourinary: Negative.  Negative for dysuria and flank pain.  Musculoskeletal: Negative.  Negative for joint pain and myalgias.  Skin: Negative.   Neurological: Negative.  Negative for dizziness and headaches.  Endo/Heme/Allergies: Negative.   Psychiatric/Behavioral: Negative.  Negative for depression and suicidal ideas. The patient is not nervous/anxious.        Objective:   BP 130/68   Pulse 80   Ht 5\' 6"  (1.676 m)   Wt 251 lb (113.9 kg)   SpO2 94%   BMI 40.51 kg/m   Vitals:   09/20/23 1117  BP: 130/68  Pulse: 80  Height: 5\' 6"  (1.676 m)  Weight: 251 lb (113.9 kg)  SpO2: 94%  BMI (Calculated): 40.53    Physical Exam Vitals and nursing note reviewed.  Constitutional:      Appearance: Normal appearance.  HENT:     Head: Normocephalic and atraumatic.     Nose: Nose normal.     Mouth/Throat:     Mouth: Mucous membranes are moist.     Pharynx: Oropharynx is clear.  Eyes:     Conjunctiva/sclera: Conjunctivae normal.     Pupils: Pupils are equal, round, and reactive to light.  Cardiovascular:     Rate and Rhythm: Normal rate and regular rhythm.     Pulses: Normal pulses.     Heart sounds: Normal heart sounds. No murmur heard. Pulmonary:     Effort: Pulmonary effort is normal.     Breath sounds: Normal breath sounds. No wheezing.  Abdominal:     General: Bowel sounds are normal.     Palpations: Abdomen is soft.     Tenderness: There is no abdominal tenderness. There is no right CVA tenderness or left CVA tenderness.  Musculoskeletal:        General: Normal range of motion.     Cervical back: Normal range of motion.     Right  lower leg: No edema.     Left lower leg: No edema.  Skin:    General: Skin is warm and dry.  Neurological:     General: No focal deficit present.     Mental Status: She is alert and oriented to person,  place, and time.  Psychiatric:        Mood and Affect: Mood normal.        Behavior: Behavior normal.      Results for orders placed or performed in visit on 09/20/23  POCT CBG (Fasting - Glucose)  Result Value Ref Range   Glucose Fasting, POC 134 (A) 70 - 99 mg/dL    Recent Results (from the past 2160 hour(s))  POCT CBG (Fasting - Glucose)     Status: Abnormal   Collection Time: 08/17/23  1:29 PM  Result Value Ref Range   Glucose Fasting, POC 129 (A) 70 - 99 mg/dL  Hemoglobin Z3Y     Status: Abnormal   Collection Time: 08/28/23  8:44 AM  Result Value Ref Range   Hgb A1c MFr Bld 7.1 (H) 4.8 - 5.6 %    Comment:          Prediabetes: 5.7 - 6.4          Diabetes: >6.4          Glycemic control for adults with diabetes: <7.0    Est. average glucose Bld gHb Est-mCnc 157 mg/dL  [id number]     Status: Abnormal   Collection Time: 08/28/23  8:44 AM  Result Value Ref Range   Glucose 167 (H) 70 - 99 mg/dL   BUN 20 8 - 27 mg/dL   Creatinine, Ser 2.95 (H) 0.57 - 1.00 mg/dL   eGFR 55 (L) >28 UX/LKG/4.01   BUN/Creatinine Ratio 19 12 - 28   Sodium 138 134 - 144 mmol/L   Potassium 5.5 (H) 3.5 - 5.2 mmol/L   Chloride 100 96 - 106 mmol/L   CO2 20 20 - 29 mmol/L   Calcium 10.4 (H) 8.7 - 10.3 mg/dL   Total Protein 7.3 6.0 - 8.5 g/dL   Albumin 4.4 3.8 - 4.8 g/dL   Globulin, Total 2.9 1.5 - 4.5 g/dL   Bilirubin Total 0.2 0.0 - 1.2 mg/dL   Alkaline Phosphatase 70 44 - 121 IU/L   AST 24 0 - 40 IU/L   ALT 15 0 - 32 IU/L  Lipid Panel w/o Chol/HDL Ratio     Status: Abnormal   Collection Time: 08/28/23  8:44 AM  Result Value Ref Range   Cholesterol, Total 241 (H) 100 - 199 mg/dL   Triglycerides 027 (HH) 0 - 149 mg/dL   HDL 14 (L) >25 mg/dL   VLDL Cholesterol Cal 101 (H) 5 - 40 mg/dL    LDL Chol Calc (NIH) 126 (H) 0 - 99 mg/dL  CBC with Diff     Status: None   Collection Time: 08/28/23  8:44 AM  Result Value Ref Range   WBC 4.9 3.4 - 10.8 x10E3/uL   RBC 3.94 3.77 - 5.28 x10E6/uL   Hemoglobin 12.6 11.1 - 15.9 g/dL   Hematocrit 36.6 44.0 - 46.6 %   MCV 95 79 - 97 fL   MCH 32.0 26.6 - 33.0 pg   MCHC 33.6 31.5 - 35.7 g/dL   RDW 34.7 42.5 - 95.6 %   Platelets 311 150 - 450 x10E3/uL   Neutrophils 55 Not Estab. %   Lymphs 33 Not Estab. %   Monocytes 7 Not Estab. %   Eos 4 Not Estab. %   Basos 1 Not Estab. %   Neutrophils Absolute 2.7 1.4 - 7.0 x10E3/uL   Lymphocytes Absolute 1.6 0.7 - 3.1 x10E3/uL   Monocytes Absolute 0.4 0.1 - 0.9 x10E3/uL   EOS (ABSOLUTE)  0.2 0.0 - 0.4 x10E3/uL   Basophils Absolute 0.1 0.0 - 0.2 x10E3/uL   Immature Granulocytes 0 Not Estab. %   Immature Grans (Abs) 0.0 0.0 - 0.1 x10E3/uL  POCT CBG (Fasting - Glucose)     Status: Abnormal   Collection Time: 08/30/23 11:33 AM  Result Value Ref Range   Glucose Fasting, POC 152 (A) 70 - 99 mg/dL  Basic metabolic panel     Status: Abnormal   Collection Time: 09/18/23  8:19 AM  Result Value Ref Range   Glucose 140 (H) 70 - 99 mg/dL   BUN 21 8 - 27 mg/dL   Creatinine, Ser 6.04 (H) 0.57 - 1.00 mg/dL   eGFR 50 (L) >54 UJ/WJX/9.14   BUN/Creatinine Ratio 18 12 - 28   Sodium 140 134 - 144 mmol/L   Potassium 5.0 3.5 - 5.2 mmol/L   Chloride 104 96 - 106 mmol/L   CO2 19 (L) 20 - 29 mmol/L   Calcium 10.6 (H) 8.7 - 10.3 mg/dL  Lipid Panel w/o Chol/HDL Ratio     Status: Abnormal   Collection Time: 09/18/23  8:19 AM  Result Value Ref Range   Cholesterol, Total 184 100 - 199 mg/dL   Triglycerides 782 (H) 0 - 149 mg/dL   HDL 17 (L) >95 mg/dL   VLDL Cholesterol Cal 59 (H) 5 - 40 mg/dL   LDL Chol Calc (NIH) 621 (H) 0 - 99 mg/dL  POCT CBG (Fasting - Glucose)     Status: Abnormal   Collection Time: 09/20/23 11:28 AM  Result Value Ref Range   Glucose Fasting, POC 134 (A) 70 - 99 mg/dL      Assessment &  Plan:  Patient will continue all her medications.  Monitor blood pressure and blood sugar at home. Problem List Items Addressed This Visit     Anxiety and depression   Mixed hyperlipidemia   Esophageal reflux   Type 2 diabetes mellitus with hyperglycemia, without long-term current use of insulin (HCC) - Primary   Relevant Orders   POCT CBG (Fasting - Glucose) (Completed)   Essential hypertension, benign    Return in about 3 months (around 12/20/2023).   Total time spent: 30 minutes  Margaretann Loveless, MD  09/20/2023   This document may have been prepared by [facility] Voice Recognition software and as such may include unintentional dictation errors.

## 2023-09-21 ENCOUNTER — Other Ambulatory Visit: Payer: Self-pay | Admitting: Family

## 2023-09-24 ENCOUNTER — Other Ambulatory Visit: Payer: Self-pay | Admitting: Internal Medicine

## 2023-09-24 DIAGNOSIS — Z23 Encounter for immunization: Secondary | ICD-10-CM | POA: Diagnosis not present

## 2023-09-24 NOTE — Addendum Note (Signed)
Addended by: Margaretann Loveless on: 09/24/2023 02:06 PM   Modules accepted: Orders

## 2023-09-27 ENCOUNTER — Other Ambulatory Visit: Payer: Self-pay | Admitting: Family

## 2023-09-28 ENCOUNTER — Other Ambulatory Visit: Payer: Self-pay | Admitting: Internal Medicine

## 2023-09-28 DIAGNOSIS — F419 Anxiety disorder, unspecified: Secondary | ICD-10-CM

## 2023-09-28 MED ORDER — DIAZEPAM 5 MG PO TABS
ORAL_TABLET | ORAL | 0 refills | Status: DC
Start: 2023-09-28 — End: 2024-05-08

## 2023-09-28 NOTE — Progress Notes (Signed)
valium 

## 2023-10-26 ENCOUNTER — Telehealth: Payer: Self-pay

## 2023-10-26 NOTE — Telephone Encounter (Signed)
Patients daughter sharon LM asking for call back, she [place] requesting samples on behalf of the patient nexletol or nexlizet

## 2023-11-06 ENCOUNTER — Other Ambulatory Visit: Payer: Self-pay | Admitting: Internal Medicine

## 2023-11-06 DIAGNOSIS — B0229 Other postherpetic nervous system involvement: Secondary | ICD-10-CM

## 2023-11-12 ENCOUNTER — Ambulatory Visit (INDEPENDENT_AMBULATORY_CARE_PROVIDER_SITE_OTHER): Payer: Medicare Other | Admitting: Dermatology

## 2023-11-12 DIAGNOSIS — R202 Paresthesia of skin: Secondary | ICD-10-CM | POA: Diagnosis not present

## 2023-11-12 DIAGNOSIS — L304 Erythema intertrigo: Secondary | ICD-10-CM

## 2023-11-12 DIAGNOSIS — L821 Other seborrheic keratosis: Secondary | ICD-10-CM

## 2023-11-12 DIAGNOSIS — L820 Inflamed seborrheic keratosis: Secondary | ICD-10-CM

## 2023-11-12 MED ORDER — KETOCONAZOLE 2 % EX CREA: TOPICAL_CREAM | CUTANEOUS | 5 refills | Status: DC

## 2023-11-12 MED ORDER — HYDROCORTISONE 2.5 % EX CREA: TOPICAL_CREAM | CUTANEOUS | 2 refills | Status: DC

## 2023-11-12 NOTE — Progress Notes (Signed)
Follow-Up Visit   Subjective  Taylor Barry is a 77 y.o. female who presents for the following: patient here today with concerns about irritating spots under breasts, under arms, and spot at left buttock.   The patient has spots, moles and lesions to be evaluated, some may be new or changing and the patient may have concern these could be cancer.   The following portions of the chart were reviewed this encounter and updated as appropriate: medications, allergies, medical history  Review of Systems:  No other skin or systemic complaints except as noted in HPI or Assessment and Plan.  Objective  Well appearing patient in no apparent distress; mood and affect are within normal limits.   A focused examination was performed of the following areas: Left buttock, back, b/l inframammary , b/l arms  Relevant exam findings are noted in the Assessment and Plan.  left buttock x 1, left axilla x 7, right axilla x 4, right inframammary x  7, left inframammary x 8 (27) Erythematous stuck-on, waxy papule or plaque    Assessment & Plan   SEBORRHEIC KERATOSIS - Stuck-on, waxy, tan-brown papules and/or plaques  - Benign-appearing - Discussed benign etiology and prognosis. - Observe - Call for any changes   NOTALGIA PARESTHETICA Left spinal mid back Exam: Perispinal hyperpigmented patch Chronic condition without cure secondary to pinched nerve along spine causing itching or sensation changes in an area of skin. Chronic rubbing or scratching causes darkening of the skin.  OTC treatments which can help with itch include numbing creams like pramoxine or lidocaine which temporarily reduce itch or Capsaicin-containing creams which cause a burning sensation but which sometimes over time will reset the nerves to stop producing itch.  If you choose to use Capsaicin cream, it is recommended to use it 5 times daily for 1 week followed by 3 times daily for 3-6 weeks. You may have to continue using it  long-term.  If not doing well with OTC options, could consider [employer] compounded prescription anti-itch cream with Amitriptyline 5% / Lidocaine 5% / Pramoxine 1% or Amitriptyline 5% / Gabapentin 10% / Lidocaine 5% Cream or other prescription cream or pill options.   INTERTRIGO Exam Erythematous macerated patches at b/l inframammary  Chronic and persistent condition with duration or expected duration over one year. Condition is bothersome/symptomatic for patient. Currently flared.   Intertrigo is a chronic recurrent rash that occurs in skin fold areas that may be associated with friction; heat; moisture; yeast; fungus; and bacteria.  It is exacerbated by increased movement / activity; sweating; and higher atmospheric temperature.  Treatment Plan  Mix equal amounts of hydrocortisone 2.5% cream with ketaconazole 2% cream and apply to affected areas twice a day.  If improved, decrease to hydrocortisone and ketaconazole mixed once a day. If still clear, decrease to ketaconazole cream only, once daily to help prevent flares.  Do not use the hydrocortisone cream for maintenance, since long term use of a topical steroid can thin the skin.  May repeat regimen as needed for flares.  Recommend OTC Zeasorb AF powder to body folds daily after shower.  It is often found in the athlete's foot section in the pharmacy.  Avoid using powders that contain cornstarch.  Erythema intertrigo  Related Medications hydrocortisone 2.5 % cream Apply topically to affected areas qd/bid prn for rash  Use as directed in handout  ketoconazole (NIZORAL) 2 % cream Apply topically qd/bid daily as needed for rash.  Use as directed in handout  Inflamed seborrheic keratosis (27) left buttock x 1, left axilla x 7, right axilla x 4, right inframammary x  7, left inframammary x 8  Symptomatic, irritating, patient would like treated.  Destruction of lesion - left buttock x 1, left axilla x 7, right axilla x 4, right  inframammary x  7, left inframammary x 8 (27)  Destruction method: cryotherapy   Informed consent: discussed and consent obtained   Lesion destroyed using liquid nitrogen: Yes   Region frozen until ice ball extended beyond lesion: Yes   Outcome: patient tolerated procedure well with no complications   Post-procedure details: wound care instructions given   Additional details:  Prior to procedure, discussed risks of blister formation, small wound, skin dyspigmentation, or rare scar following cryotherapy. Recommend Vaseline ointment to treated areas while healing.     Return if symptoms worsen or fail to improve.  I, Asher Muir, CMA, am acting as scribe for Willeen Niece, MD.  Documentation: I have reviewed the above documentation for accuracy and completeness, and I agree with the above.  Willeen Niece, MD

## 2023-11-12 NOTE — Patient Instructions (Addendum)
For itchy areas under breasts  Intertrigo is a chronic recurrent rash that occurs in skin fold areas that may be associated with friction; heat; moisture; yeast; fungus; and bacteria.  It is exacerbated by increased movement / activity; sweating; and higher atmospheric temperature.  Mix equal amounts of hydrocortisone 2.5% cream with ketaconazole 2% cream and apply to affected areas twice a day.  If improved, decrease to hydrocortisone and ketaconazole mixed once a day. If still clear, decrease to ketaconazole cream only, once daily to help prevent flares.  Do not use the hydrocortisone cream for maintenance, since long term use of a topical steroid can thin the skin.  May repeat regimen as needed for flares.   Recommend OTC Zeasorb AF powder to body folds daily after shower.  It is often found in the athlete's foot section in the pharmacy.  Avoid using powders that contain cornstarch.      Cryotherapy Aftercare  Wash gently with soap and water everyday.   Apply Vaseline and Band-Aid daily until healed.   Seborrheic Keratosis  What causes seborrheic keratoses? Seborrheic keratoses are harmless, common skin growths that first appear during adult life.  As time goes by, more growths appear.  Some people may develop a large number of them.  Seborrheic keratoses appear on both covered and uncovered body parts.  They are not caused by sunlight.  The tendency to develop seborrheic keratoses can be inherited.  They vary in color from skin-colored to gray, brown, or even black.  They can be either smooth or have a rough, warty surface.   Seborrheic keratoses are superficial and look as if they were stuck on the skin.  Under the microscope this type of keratosis looks like layers upon layers of skin.  That is why at times the top layer may seem to fall off, but the rest of the growth remains and re-grows.    Treatment Seborrheic keratoses do not need to be treated, but can easily be removed in the  office.  Seborrheic keratoses often cause symptoms when they rub on clothing or jewelry.  Lesions can be in the way of shaving.  If they become inflamed, they can cause itching, soreness, or burning.  Removal of a seborrheic keratosis can be accomplished by freezing, burning, or surgery. If any spot bleeds, scabs, or grows rapidly, please return to have it checked, as these can be an indication of a skin cancer.         Due to recent changes in healthcare laws, you may see results of your pathology and/or laboratory studies on MyChart before the doctors have had a chance to review them. We understand that in some cases there may be results that are confusing or concerning to you. Please understand that not all results are received at the same time and often the doctors may need to interpret multiple results in order to provide you with the best plan of care or course of treatment. Therefore, we ask that you please give [place] 2 business days to thoroughly review all your results before contacting the office for clarification. Should we see a critical lab result, you will be contacted sooner.   If You Need Anything After Your Visit  If you have any questions or concerns for your doctor, please call our main line at [phone] and press option 4 to reach your doctor's medical assistant. If no one answers, please leave a voicemail as directed and we will return your call as soon as possible. Messages  left after 4 pm will be answered the following business day.   You may also send [place] a message via MyChart. We typically respond to MyChart messages within 1-2 business days.  For prescription refills, please ask your pharmacy to contact our office. Our fax number is [phone].  If you have an urgent issue when the clinic is closed that cannot wait until the next business day, you can page your doctor at the number below.    Please note that while we do our best to be available for urgent issues  outside of office hours, we are not available 24/7.   If you have an urgent issue and are unable to reach [place], you may choose to seek medical care at your doctor's office, retail clinic, urgent care center, or emergency room.  If you have a medical emergency, please immediately call 911 or go to the emergency department.  Pager Numbers  - Dr. Gwen Pounds: [phone]  - Dr. Roseanne Reno: [phone]  - Dr. Katrinka Blazing: [phone]   In the event of inclement weather, please call our main line at [phone] for an update on the status of any delays or closures.  Dermatology Medication Tips: Please keep the boxes that topical medications come in in order to help keep track of the instructions about where and how to use these. Pharmacies typically print the medication instructions only on the boxes and not directly on the medication tubes.   If your medication is too expensive, please contact our office at [phone] option 4 or send [place] a message through MyChart.   We are unable to tell what your co-pay for medications will be in advance as this is different depending on your insurance coverage. However, we may be able to find a substitute medication at lower cost or fill out paperwork to get insurance to cover a needed medication.   If a prior authorization is required to get your medication covered by your insurance company, please allow [place] 1-2 business days to complete this process.  Drug prices often vary depending on where the prescription is filled and some pharmacies may offer cheaper prices.  The website www.goodrx.com contains coupons for medications through different pharmacies. The prices here do not account for what the cost may be with help from insurance (it may be cheaper with your insurance), but the website can give you the price if you did not use any insurance.  - You can print the associated coupon and take it with your prescription to the pharmacy.  - You may also stop by our  office during regular business hours and pick up a GoodRx coupon card.  - If you need your prescription sent electronically to a different pharmacy, notify our office through [facility] or by phone at [phone] option 4.     Si Usted Necesita Algo Despus de Su Visita  Tambin puede enviarnos un mensaje a travs de Clinical cytogeneticist. Por lo general respondemos a los mensajes de MyChart en el transcurso de 1 a 2 das hbiles.  Para renovar recetas, por favor pida a su farmacia que se ponga en contacto con nuestra oficina. Annie Sable de fax es [place] [phone].  Si tiene un asunto urgente cuando la clnica est cerrada y que no puede esperar hasta el siguiente da hbil, puede llamar/localizar a su doctor(a) al nmero que aparece a continuacin.   Por favor, tenga en cuenta que aunque hacemos todo lo posible para estar disponibles para asuntos urgentes fuera del horario de [place], no  estamos disponibles las 24 horas del da, los 7 [address]  [address] de la [place].   Si tiene un problema urgente y no puede comunicarse con nosotros, puede optar por buscar atencin mdica  en el consultorio de su doctor(a), en una clnica privada, en un centro de atencin urgente o en una sala de emergencias.  Si tiene Engineer, drilling, por favor llame inmediatamente al 911 o vaya a la sala de emergencias.  Nmeros de bper  - Dr. Gwen Pounds: [phone]  - Dra. Roseanne Reno: [phone]  - Dr. Katrinka Blazing: [phone]   En caso de inclemencias del tiempo, por favor llame a Lacy Duverney principal al [phone] para una actualizacin sobre el [place] de cualquier retraso o cierre.  Consejos para la medicacin en dermatologa: Por favor, guarde las cajas en las que vienen los medicamentos de uso tpico para ayudarle a seguir las instrucciones sobre dnde y cmo usarlos. Las farmacias generalmente imprimen las instrucciones del medicamento slo en las cajas y no directamente en los tubos del [place].   Si su  medicamento es muy caro, por favor, pngase en contacto con Rolm Gala llamando al [phone] y presione la opcin 4 o envenos un mensaje a travs de Clinical cytogeneticist.   No podemos decirle cul ser su copago por los medicamentos por adelantado ya que esto es diferente dependiendo de la cobertura de su seguro. Sin embargo, es posible que podamos encontrar un medicamento sustituto a Audiological scientist un formulario para que el seguro cubra el medicamento que se considera necesario.   Si se requiere una autorizacin previa para que su compaa de seguros [place] su medicamento, por favor permtanos de 1 a 2 das hbiles para completar [address]  Los precios de los medicamentos varan con frecuencia dependiendo del Environmental consultant de dnde se surte la receta y alguna farmacias pueden ofrecer precios ms baratos.  El sitio web www.goodrx.com tiene cupones para medicamentos de Health and safety inspector. Los precios aqu no tienen en cuenta lo que podra costar con la ayuda del seguro (puede ser ms barato con su seguro), pero el sitio web puede darle el precio si no utiliz Tourist information centre manager.  - Puede imprimir el cupn correspondiente y llevarlo con su receta a la farmacia.  - Tambin puede pasar por nuestra oficina durante el horario de atencin regular y Education officer, museum una tarjeta de cupones de GoodRx.  - Si necesita que su receta se enve electrnicamente a una farmacia diferente, informe a nuestra oficina a travs de MyChart de [place] o por telfono llamando al [phone] y presione la opcin 4.

## 2023-12-07 DIAGNOSIS — I700 Atherosclerosis of aorta: Secondary | ICD-10-CM | POA: Diagnosis not present

## 2023-12-07 DIAGNOSIS — K219 Gastro-esophageal reflux disease without esophagitis: Secondary | ICD-10-CM | POA: Diagnosis not present

## 2023-12-07 DIAGNOSIS — R918 Other nonspecific abnormal finding of lung field: Secondary | ICD-10-CM | POA: Diagnosis not present

## 2023-12-07 DIAGNOSIS — G4733 Obstructive sleep apnea (adult) (pediatric): Secondary | ICD-10-CM | POA: Diagnosis not present

## 2023-12-07 DIAGNOSIS — R0609 Other forms of dyspnea: Secondary | ICD-10-CM | POA: Diagnosis not present

## 2023-12-17 ENCOUNTER — Telehealth: Payer: Self-pay

## 2023-12-17 NOTE — Telephone Encounter (Signed)
We have no samples of Nexletol for her. We will call her daughter [name]) when we get some samples. I informed [name]. Lurena Joiner is going to call rep for some samples.

## 2023-12-17 NOTE — Telephone Encounter (Signed)
Spoke with the rep and it'll be two weeks before we get any samples.[name] informed.

## 2023-12-21 ENCOUNTER — Ambulatory Visit: Payer: Medicare Other | Admitting: Internal Medicine

## 2023-12-28 ENCOUNTER — Encounter: Payer: Self-pay | Admitting: Cardiovascular Disease

## 2024-01-01 DIAGNOSIS — R918 Other nonspecific abnormal finding of lung field: Secondary | ICD-10-CM | POA: Diagnosis not present

## 2024-01-01 DIAGNOSIS — J439 Emphysema, unspecified: Secondary | ICD-10-CM | POA: Diagnosis not present

## 2024-01-01 DIAGNOSIS — G4733 Obstructive sleep apnea (adult) (pediatric): Secondary | ICD-10-CM | POA: Diagnosis not present

## 2024-01-03 ENCOUNTER — Other Ambulatory Visit: Payer: Medicare Other

## 2024-01-03 DIAGNOSIS — I10 Essential (primary) hypertension: Secondary | ICD-10-CM

## 2024-01-03 DIAGNOSIS — E782 Mixed hyperlipidemia: Secondary | ICD-10-CM

## 2024-01-03 DIAGNOSIS — E1165 Type 2 diabetes mellitus with hyperglycemia: Secondary | ICD-10-CM

## 2024-01-04 ENCOUNTER — Other Ambulatory Visit: Payer: Self-pay | Admitting: Specialist

## 2024-01-04 ENCOUNTER — Ambulatory Visit (INDEPENDENT_AMBULATORY_CARE_PROVIDER_SITE_OTHER): Payer: Medicare Other | Admitting: Internal Medicine

## 2024-01-04 ENCOUNTER — Encounter: Payer: Self-pay | Admitting: Internal Medicine

## 2024-01-04 VITALS — BP 140/80 | HR 74 | Ht 66.0 in | Wt 247.0 lb

## 2024-01-04 DIAGNOSIS — E1165 Type 2 diabetes mellitus with hyperglycemia: Secondary | ICD-10-CM

## 2024-01-04 DIAGNOSIS — F411 Generalized anxiety disorder: Secondary | ICD-10-CM | POA: Diagnosis not present

## 2024-01-04 DIAGNOSIS — B0229 Other postherpetic nervous system involvement: Secondary | ICD-10-CM | POA: Diagnosis not present

## 2024-01-04 DIAGNOSIS — F32A Depression, unspecified: Secondary | ICD-10-CM | POA: Diagnosis not present

## 2024-01-04 DIAGNOSIS — E1159 Type 2 diabetes mellitus with other circulatory complications: Secondary | ICD-10-CM | POA: Diagnosis not present

## 2024-01-04 DIAGNOSIS — K219 Gastro-esophageal reflux disease without esophagitis: Secondary | ICD-10-CM | POA: Diagnosis not present

## 2024-01-04 DIAGNOSIS — I152 Hypertension secondary to endocrine disorders: Secondary | ICD-10-CM

## 2024-01-04 DIAGNOSIS — F419 Anxiety disorder, unspecified: Secondary | ICD-10-CM

## 2024-01-04 DIAGNOSIS — E782 Mixed hyperlipidemia: Secondary | ICD-10-CM

## 2024-01-04 DIAGNOSIS — E1169 Type 2 diabetes mellitus with other specified complication: Secondary | ICD-10-CM | POA: Diagnosis not present

## 2024-01-04 DIAGNOSIS — R918 Other nonspecific abnormal finding of lung field: Secondary | ICD-10-CM

## 2024-01-04 LAB — LIPID PANEL
Chol/HDL Ratio: 9 {ratio} — ABNORMAL HIGH (ref 0.0–4.4)
Cholesterol, Total: 208 mg/dL — ABNORMAL HIGH (ref 100–199)
HDL: 23 mg/dL — ABNORMAL LOW (ref >39)
LDL Chol Calc (NIH): 126 mg/dL — ABNORMAL HIGH (ref 0–99)
Triglycerides: 329 mg/dL — ABNORMAL HIGH (ref 0–149)
VLDL Cholesterol Cal: 59 mg/dL — ABNORMAL HIGH (ref 5–40)

## 2024-01-04 LAB — CMP14+EGFR
ALT: 16 [IU]/L (ref 0–32)
AST: 19 [IU]/L (ref 0–40)
Albumin: 4.4 g/dL (ref 3.8–4.8)
Alkaline Phosphatase: 73 [IU]/L (ref 44–121)
BUN/Creatinine Ratio: 16 (ref 12–28)
BUN: 19 mg/dL (ref 8–27)
Bilirubin Total: 0.3 mg/dL (ref 0.0–1.2)
CO2: 19 mmol/L — ABNORMAL LOW (ref 20–29)
Calcium: 10.2 mg/dL (ref 8.7–10.3)
Chloride: 102 mmol/L (ref 96–106)
Creatinine, Ser: 1.19 mg/dL — ABNORMAL HIGH (ref 0.57–1.00)
Globulin, Total: 2.7 g/dL (ref 1.5–4.5)
Glucose: 149 mg/dL — ABNORMAL HIGH (ref 70–99)
Potassium: 5 mmol/L (ref 3.5–5.2)
Sodium: 139 mmol/L (ref 134–144)
Total Protein: 7.1 g/dL (ref 6.0–8.5)
eGFR: 47 mL/min/{1.73_m2} — ABNORMAL LOW (ref >59)

## 2024-01-04 LAB — HEMOGLOBIN A1C
Est. average glucose Bld gHb Est-mCnc: 148 mg/dL
Hgb A1c MFr Bld: 6.8 % — ABNORMAL HIGH (ref 4.8–5.6)

## 2024-01-04 LAB — TSH: TSH: 1.64 u[IU]/mL (ref 0.450–4.500)

## 2024-01-04 LAB — POCT CBG (FASTING - GLUCOSE)-MANUAL ENTRY: Glucose Fasting, POC: 141 mg/dL — AB (ref 70–99)

## 2024-01-04 MED ORDER — PREGABALIN 25 MG PO CAPS: 25.0000 mg | ORAL_CAPSULE | Freq: Two times a day (BID) | ORAL | 1 refills | Status: DC

## 2024-01-04 MED ORDER — PANTOPRAZOLE SODIUM 40 MG PO TBEC: 40.0000 mg | DELAYED_RELEASE_TABLET | Freq: Every day | ORAL | 3 refills | Status: AC

## 2024-01-04 MED ORDER — SERTRALINE HCL 25 MG PO TABS: 25.0000 mg | ORAL_TABLET | Freq: Two times a day (BID) | ORAL | 3 refills | Status: DC

## 2024-01-04 NOTE — Progress Notes (Signed)
 Established Patient Office Visit  Subjective:  Patient ID: Taylor Barry, female    DOB: 1946/12/23  Age: 78 y.o. MRN: [id number]  Chief Complaint  Patient presents with   Follow-up    3 month follow up    Patient comes in for follow-up, accompanied by her daughter.  She recently had blood work done and  needed to discuss the results.  Her lipid panel is very abnormal, as she ran out of Nexletol  samples.  Patient cannot tolerate statins and that her insurance does not cover any alternatives.  She is also suffering from some dental issues and has not been able to eat properly.  Although she has lost some weight, her blood pressure and cholesterol are high. Patient agrees to start a proper diet even if it needs to be soft.    No other concerns at this time.   Past Medical History:  Diagnosis Date   Allergic rhinitis with postnasal drip    Anxiety and depression    Arthritis    Arthritis, multiple joint involvement    Bronchitis, acute, with bronchospasm    Hair loss    HTN, goal below 150/90    Hyperlipidemia LDL goal <100    Obstructive sleep apnea, adult    CPAP   Right adrenal mass (HCC)    Vitamin D deficiency     Past Surgical History:  Procedure Laterality Date   ABDOMINAL HYSTERECTOMY  1968   total   COLONOSCOPY WITH PROPOFOL  N/A 04/15/2021   Procedure: COLONOSCOPY WITH PROPOFOL ;  Surgeon: Jinny Carmine, MD;  [facility];  Service: Endoscopy;  Laterality: N/A;  [facility] - oral meds priority 4   REPLACEMENT TOTAL KNEE Bilateral    Right knee first, Left knee more recent   TYMPANOPLASTY Right 1977    Social History   Socioeconomic History   Marital status: Divorced    Spouse name: Not on file   Number of children: Not on file   Years of education: Not on file   Highest education level: Not on file  Occupational History   Not on file  Tobacco Use   Smoking status: Former    Current packs/day: 0.00    Types: Cigarettes    Quit date:  01/25/1995    Years since quitting: 28.9   Smokeless tobacco: Never   Tobacco comments:    social smoker as teenager  Vaping Use   Vaping status: Never Used  Substance and Sexual Activity   Alcohol use: No    Alcohol/week: 0.0 standard drinks of alcohol   Drug use: No   Sexual activity: Never  Other Topics Concern   Not on file  Social History Narrative   Not on file   Social Drivers of Health   Financial Resource Strain: Low Risk  (01/01/2024)   Received [facility]   Overall Financial Resource Strain (CARDIA)    Difficulty of Paying Living Expenses: Not hard at all  Food Insecurity: No Food Insecurity (01/01/2024)   Received from [facility]   Hunger Vital Sign    Worried About Running Out of Food in the Last Year: Never true    Ran Out of Food in the Last Year: Never true  Transportation Needs: No Transportation Needs (01/01/2024)   Received from [facility] - Transportation    In the past 12 months, has lack of transportation kept you from medical appointments or from getting medications?: No  Lack of Transportation (Non-Medical): No  Physical Activity: Not on file  Stress: Not on file  Social Connections: Unknown (05/09/2022)   Received from [facility]   Social Network    Social Network: Not on file  Intimate Partner Violence: Unknown (03/31/2022)   Received from [facility]   HITS    Physically Hurt: Not on file    Insult or Talk Down To: Not on file    Threaten Physical Harm: Not on file    Scream or Curse: Not on file    Family History  Problem Relation Age of Onset   Heart disease Mother    Alcohol abuse Father    Diabetes Daughter    Breast cancer Maternal Aunt    Cancer Brother    Cancer Maternal Grandmother    Cancer Maternal Grandfather     Allergies  Allergen Reactions   Acetaminophen      Pt denies   Codeine Nausea Only    Hydrocodone -Acetaminophen  Other (See Comments)    Puts me up the wall   Oxycodone Hcl    Oxycodone-Acetaminophen      Hallucination   Prednisolone     Hallucination   Statins     Outpatient Medications Prior to Visit  Medication Sig   Bempedoic Acid  (NEXLETOL ) 180 MG TABS Take 1 tablet (180 mg total) by mouth daily.   diazepam  (VALIUM ) 5 MG tablet Take half or one tablet once a day PRN only for severe anxiety   empagliflozin  (JARDIANCE ) 25 MG TABS tablet Take 1 tablet (25 mg total) by mouth daily.   fenofibrate  (TRICOR ) 145 MG tablet TAKE 1 TABLET BY MOUTH ONCE  DAILY   hydrocortisone  2.5 % cream Apply topically to affected areas qd/bid prn for rash  Use as directed in handout   ibuprofen (ADVIL) 800 MG tablet SMARTSIG:1 Tablet(s) By Mouth 1 to 3 Times Daily PRN   ketoconazole  (NIZORAL ) 2 % cream Apply topically qd/bid daily as needed for rash.  Use as directed in handout   losartan -hydrochlorothiazide  (HYZAAR) 100-12.5 MG tablet TAKE 1 TABLET BY MOUTH ONCE  DAILY   ONETOUCH VERIO test strip 3 (three) times daily.   [DISCONTINUED] metFORMIN (GLUCOPHAGE-XR) 500 MG 24 hr tablet Take 500 mg by mouth daily with breakfast.   [DISCONTINUED] pantoprazole  (PROTONIX ) 40 MG tablet Take 1 tablet (40 mg total) by mouth daily.   [DISCONTINUED] pregabalin  (LYRICA ) 25 MG capsule TAKE 1 CAPSULE(25 MG) BY MOUTH TWICE DAILY   aspirin 81 MG chewable tablet Chew by mouth. (Patient not taking: Reported on 01/04/2024)   fluocinonide  (LIDEX ) 0.05 % external solution Apply 1-2 drops to affected areas scalp once to twice daily as needed for itch. Avoid face. (Patient not taking: Reported on 01/04/2024)   hydrOXYzine  (ATARAX ) 10 MG tablet Take 1 tablet (10 mg total) by mouth 3 (three) times daily as needed. (Patient not taking: Reported on 01/04/2024)   ketoconazole  (NIZORAL ) 2 % shampoo Massage into scalp 3 times a week, let sit 5 minutes, then rinse. (Patient not taking: Reported on 01/04/2024)   Multiple  Vitamins-Minerals (MULTIVITAMIN WITH MINERALS) tablet Take 1 tablet by mouth daily. (Patient not taking: Reported on 01/04/2024)   spironolactone  (ALDACTONE ) 25 MG tablet Take 0.5 tablets (12.5 mg total) by mouth daily. (Patient not taking: Reported on 01/04/2024)   [DISCONTINUED] sertraline  (ZOLOFT ) 25 MG tablet TAKE 1 TABLET BY MOUTH EVERY DAY (Patient not taking: Reported on 01/04/2024)   No facility-administered medications prior to visit.    Review of  Systems  Constitutional: Negative.  Negative for chills, diaphoresis, fever and malaise/fatigue.  HENT: Negative.  Negative for congestion and ear discharge.   Eyes: Negative.   Respiratory: Negative.  Negative for cough, shortness of breath and stridor.   Cardiovascular: Negative.  Negative for chest pain, palpitations and leg swelling.  Gastrointestinal: Negative.  Negative for abdominal pain, constipation, diarrhea, heartburn, nausea and vomiting.  Genitourinary: Negative.  Negative for dysuria and flank pain.  Musculoskeletal: Negative.  Negative for joint pain and myalgias.  Skin: Negative.   Neurological: Negative.  Negative for dizziness and headaches.  Endo/Heme/Allergies: Negative.   Psychiatric/Behavioral: Negative.  Negative for depression and suicidal ideas. The patient is not nervous/anxious.        Objective:   BP (!) 140/80   Pulse 74   Ht 5' 6 (1.676 m)   Wt 247 lb (112 kg)   SpO2 95%   BMI 39.87 kg/m   Vitals:   01/04/24 1106  BP: (!) 140/80  Pulse: 74  Height: 5' 6 (1.676 m)  Weight: 247 lb (112 kg)  SpO2: 95%  BMI (Calculated): 39.89    Physical Exam Vitals and nursing note reviewed.  Constitutional:      Appearance: Normal appearance.  HENT:     Head: Normocephalic and atraumatic.     Nose: Nose normal.     Mouth/Throat:     Mouth: Mucous membranes are moist.     Pharynx: Oropharynx is clear.  Eyes:     Conjunctiva/sclera: Conjunctivae normal.     Pupils: Pupils are equal, round, and  reactive to light.  Cardiovascular:     Rate and Rhythm: Normal rate and regular rhythm.     Pulses: Normal pulses.     Heart sounds: Normal heart sounds. No murmur heard. Pulmonary:     Effort: Pulmonary effort is normal.     Breath sounds: Normal breath sounds. No wheezing.  Abdominal:     General: Bowel sounds are normal.     Palpations: Abdomen is soft.     Tenderness: There is no abdominal tenderness. There is no right CVA tenderness or left CVA tenderness.  Musculoskeletal:        General: Normal range of motion.     Cervical back: Normal range of motion.     Right lower leg: No edema.     Left lower leg: No edema.  Skin:    General: Skin is warm and dry.  Neurological:     General: No focal deficit present.     Mental Status: She is alert and oriented to person, place, and time.  Psychiatric:        Mood and Affect: Mood normal.        Behavior: Behavior normal.      Results for orders placed or performed in visit on 01/04/24  POCT CBG (Fasting - Glucose)  Result Value Ref Range   Glucose Fasting, POC 141 (A) 70 - 99 mg/dL    Recent Results (from the past 2160 hours)  Hemoglobin A1c     Status: Abnormal   Collection Time: 01/03/24  8:18 AM  Result Value Ref Range   Hgb A1c MFr Bld 6.8 (H) 4.8 - 5.6 %    Comment:          Prediabetes: 5.7 - 6.4          Diabetes: >6.4          Glycemic control for adults with diabetes: <7.0    Est. average  glucose Bld gHb Est-mCnc 148 mg/dL  TSH     Status: None   Collection Time: 01/03/24  8:18 AM  Result Value Ref Range   TSH 1.640 0.450 - 4.500 uIU/mL  CMP14+EGFR     Status: Abnormal   Collection Time: 01/03/24  8:18 AM  Result Value Ref Range   Glucose 149 (H) 70 - 99 mg/dL   BUN 19 8 - 27 mg/dL   Creatinine, Ser 8.80 (H) 0.57 - 1.00 mg/dL   eGFR 47 (L) >40 fO/fpw/8.26   BUN/Creatinine Ratio 16 12 - 28   Sodium 139 134 - 144 mmol/L   Potassium 5.0 3.5 - 5.2 mmol/L   Chloride 102 96 - 106 mmol/L   CO2 19 (L) 20 -  29 mmol/L   Calcium 10.2 8.7 - 10.3 mg/dL   Total Protein 7.1 6.0 - 8.5 g/dL   Albumin 4.4 3.8 - 4.8 g/dL   Globulin, Total 2.7 1.5 - 4.5 g/dL   Bilirubin Total 0.3 0.0 - 1.2 mg/dL   Alkaline Phosphatase 73 44 - 121 IU/L   AST 19 0 - 40 IU/L   ALT 16 0 - 32 IU/L  Lipid panel     Status: Abnormal   Collection Time: 01/03/24  8:18 AM  Result Value Ref Range   Cholesterol, Total 208 (H) 100 - 199 mg/dL   Triglycerides 670 (H) 0 - 149 mg/dL   HDL 23 (L) >60 mg/dL   VLDL Cholesterol Cal 59 (H) 5 - 40 mg/dL   LDL Chol Calc (NIH) 873 (H) 0 - 99 mg/dL   Chol/HDL Ratio 9.0 (H) 0.0 - 4.4 ratio    Comment:                                   T. Chol/HDL Ratio                                             Men  Women                               1/2 Avg.Risk  3.4    3.3                                   Avg.Risk  5.0    4.4                                2X Avg.Risk  9.6    7.1                                3X Avg.Risk 23.4   11.0   POCT CBG (Fasting - Glucose)     Status: Abnormal   Collection Time: 01/04/24 11:14 AM  Result Value Ref Range   Glucose Fasting, POC 141 (A) 70 - 99 mg/dL      Assessment & Plan:  Patient advised to continue her medications, along with strict diet control.  Meds refilled today. Problem List Items Addressed This Visit     Anxiety and depression   Relevant  Medications   sertraline  (ZOLOFT ) 25 MG tablet   Hypertension associated with diabetes (HCC) - Primary   Esophageal reflux   Relevant Medications   pantoprazole  (PROTONIX ) 40 MG tablet   Type 2 diabetes mellitus with hyperglycemia, without long-term current use of insulin  (HCC)   Relevant Orders   POCT CBG (Fasting - Glucose) (Completed)   GAD (generalized anxiety disorder)   Relevant Medications   sertraline  (ZOLOFT ) 25 MG tablet   Combined hyperlipidemia associated with type 2 diabetes mellitus (HCC)   Other Visit Diagnoses       Post herpetic neuralgia       Relevant Medications   pregabalin   (LYRICA ) 25 MG capsule       Return in about 3 months (around 04/03/2024).   Total time spent: 30 minutes  FERNAND FREDY RAMAN, MD  01/04/2024   This document may have been prepared by [facility] Voice Recognition software and as such may include unintentional dictation errors.

## 2024-01-08 ENCOUNTER — Other Ambulatory Visit: Payer: Self-pay | Admitting: Internal Medicine

## 2024-01-08 MED ORDER — FENOFIBRATE 145 MG PO TABS: 145.0000 mg | ORAL_TABLET | Freq: Every day | ORAL | 3 refills | Status: AC

## 2024-01-15 ENCOUNTER — Ambulatory Visit: Payer: Medicare Other

## 2024-01-30 ENCOUNTER — Other Ambulatory Visit: Payer: Self-pay | Admitting: Internal Medicine

## 2024-01-30 DIAGNOSIS — E1165 Type 2 diabetes mellitus with hyperglycemia: Secondary | ICD-10-CM

## 2024-02-06 ENCOUNTER — Telehealth: Payer: Self-pay | Admitting: Internal Medicine

## 2024-02-06 NOTE — Telephone Encounter (Signed)
Patient left VM that she needs some Nexletol samples.  Spoke with patient and advised her I will have some samples upstairs for her to pick up tomorrow.

## 2024-02-26 ENCOUNTER — Encounter: Payer: Self-pay | Admitting: Pulmonary Disease

## 2024-02-26 ENCOUNTER — Ambulatory Visit: Payer: No Typology Code available for payment source | Admitting: Pulmonary Disease

## 2024-02-26 VITALS — BP 120/68 | HR 71 | Temp 97.1°F | Ht 66.0 in | Wt 245.0 lb

## 2024-02-26 DIAGNOSIS — G4734 Idiopathic sleep related nonobstructive alveolar hypoventilation: Secondary | ICD-10-CM | POA: Diagnosis not present

## 2024-02-26 DIAGNOSIS — R918 Other nonspecific abnormal finding of lung field: Secondary | ICD-10-CM | POA: Diagnosis not present

## 2024-02-26 DIAGNOSIS — I350 Nonrheumatic aortic (valve) stenosis: Secondary | ICD-10-CM

## 2024-02-26 DIAGNOSIS — R0609 Other forms of dyspnea: Secondary | ICD-10-CM | POA: Diagnosis not present

## 2024-02-26 LAB — NITRIC OXIDE: Nitric Oxide: 23

## 2024-02-26 NOTE — Progress Notes (Signed)
 Subjective:    Patient ID: Taylor Barry, female    DOB: 12-Mar-1946, 78 y.o.   MRN: [id number]  Patient Care Team: Margaretann Loveless, MD as PCP - General (Internal Medicine)  Chief Complaint  Patient presents with   Consult    Has had Covid 2 times. SOB since then, over a year ago. Occasional wheezing. No cough.     BACKGROUND: Previously seen Dr. Meredeth Ide, presents for referred for a second opinion with regards to finding on CT scan of the chest.  Primary care physician is Yves Dill, MD   HPI Discussed the use of AI scribe software for clinical note transcription with the patient, who gave verbal consent to proceed.  History of Present Illness Taylor Barry is a 78 year old female with aortic stenosis and prior history of COVID-19 who presents for an abnormality on her CT chest.  Presents for a second opinion.  She presents with her daughter who is an EMT.  An irregular peribronchovascular consolidation on the left upper lobe was identified in a CT scan from 20 June 2023.  There was mention that perhaps this abnormality was present since as far back as 2019, however I have not been able to corroborate this with the available data from imaging of 2019. She has undergone multiple x-rays and a CT scan, previously and these were reviewed.  He also has a 7 mm right upper lobe nodule that requires follow-up as well.  She has had COVID-19 twice, with the last episode being particularly severe, occurring approximately six months apart.  She has noted significant dyspnea after those 2 episodes of COVID, she has not recovered.  Dizziness mostly on exertion.  Occasional wheezing noted.  She has no cough.  She experiences hoarseness and changes in her voice, which she attributes to her medication. She recently stopped taking pregabalin, which she had been prescribed for shingles, due to side effects including itching.  She uses a CPAP machine for sleep apnea for many years, noting a recent need to  add water to the machine in the morning, which is a new development.  Her compliance download over the last month was reviewed and it is excellent her compliance is at 100%.  She is not on AutoSet at 5 to 15 cm H2O.  Residual AHI is 0.5.  No significant leaks.  Average pressure ranges between 8 to 9 cm H2O.  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   Review of Systems A 10 point review of systems was performed and it is as noted above otherwise negative.   Past Medical History:  Diagnosis Date   Allergic rhinitis with postnasal drip    Anxiety and depression    Arthritis    Arthritis, multiple joint involvement    Bronchitis, acute, with bronchospasm    Hair loss    HTN, goal below 150/90    Hyperlipidemia LDL goal <100    Obstructive sleep apnea, adult    CPAP   Right adrenal mass (HCC)    Vitamin D deficiency     Past Surgical History:  Procedure Laterality Date   ABDOMINAL HYSTERECTOMY  1968   total   COLONOSCOPY WITH PROPOFOL N/A 04/15/2021   Procedure: COLONOSCOPY WITH PROPOFOL;  Surgeon: Midge Minium, MD;  Location: [facility];  Service: Endoscopy;  Laterality: N/A;  Daibetic - oral meds priority 4   REPLACEMENT TOTAL KNEE  Bilateral    Right knee first, Left knee more recent   TYMPANOPLASTY Right 1977    Patient Active Problem List   Diagnosis Date Noted   Combined hyperlipidemia associated with type 2 diabetes mellitus (HCC) 01/04/2024   Adhesive capsulitis of right shoulder 08/30/2023   History of colon polyps    Morbid obesity with body mass index (BMI) of 40.0 or higher (HCC) 07/25/2020   Radiculopathy, lumbar region 11/05/2018   Major depressive disorder, single episode, severe (HCC) 11/05/2018   GAD (generalized anxiety disorder) 11/04/2018   Essential hypertension, benign 11/04/2018   Varicose veins of lower extremity 10/21/2018   Chest pain 07/10/2018   Diverticulosis  of colon 01/29/2018   Type 2 diabetes mellitus with hyperglycemia, without long-term current use of insulin (HCC) 01/29/2018   Umbilical hernia 01/29/2018   Disorder of adrenal gland, unspecified (HCC) 01/29/2018   Diabetic peripheral neuropathy (HCC) 01/29/2018   Esophageal reflux 02/14/2016   Medicare annual wellness visit, subsequent 09/22/2015   Major depressive disorder, recurrent episode, in partial remission with anxious distress (HCC) 09/22/2015   Insomnia 09/22/2015   Need for immunization against influenza 09/22/2015   Benign hypertension with CKD (chronic kidney disease), stage II 07/29/2015   Adrenal mass (HCC) 07/28/2015   Allergic rhinitis with postnasal drip 07/28/2015   Anxiety and depression 07/28/2015   Osteoarthritis of multiple joints 07/28/2015   Alopecia 07/28/2015   Mixed hyperlipidemia 07/28/2015   Hypertension associated with diabetes (HCC) 07/28/2015   Obstructive sleep apnea of adult 07/28/2015   Borderline diabetes 07/28/2015   Avitaminosis D 07/28/2015   Adenomatous colon polyp 07/28/2015   Other fatigue 07/28/2015   H/O total knee replacement 10/08/2014   Arthritis of knee, degenerative 07/24/2014    Family History  Problem Relation Age of Onset   Heart disease Mother    Alcohol abuse Father    Diabetes Daughter    Breast cancer Maternal Aunt    Cancer Brother    Cancer Maternal Grandmother    Cancer Maternal Grandfather     Social History   Tobacco Use   Smoking status: Former    Current packs/day: 0.00    Types: Cigarettes    Quit date: 01/26/1992    Years since quitting: 32.1   Smokeless tobacco: Never   Tobacco comments:    Started smoking in the mid 80's    Smoked 0.25 PPD at the heaviest.      Substance Use Topics   Alcohol use: No    Alcohol/week: 0.0 standard drinks of alcohol    Allergies  Allergen Reactions   Acetaminophen     Pt denies   Codeine Nausea Only   Hydrocodone-Acetaminophen Other (See Comments)    "Puts me  up the wall"   Oxycodone Hcl    Oxycodone-Acetaminophen     Hallucination   Prednisolone     Hallucination   Statins     Current Meds  Medication Sig   albuterol (VENTOLIN HFA) 108 (90 Base) MCG/ACT inhaler Inhale 2 puffs into the lungs every 6 (six) hours as needed.   Bempedoic Acid (NEXLETOL) 180 MG TABS Take 1 tablet (180 mg total) by mouth daily.   fenofibrate (TRICOR) 145 MG tablet Take 1 tablet (145 mg total) by mouth daily.   hydrOXYzine (ATARAX) 10 MG tablet Take 1 tablet (10 mg total) by mouth 3 (three) times daily as needed.   ibuprofen (ADVIL) 800 MG tablet SMARTSIG:1 Tablet(s) By Mouth 1 to 3 Times Daily PRN   JARDIANCE 25  MG TABS tablet TAKE 1 TABLET BY MOUTH DAILY   losartan-hydrochlorothiazide (HYZAAR) 100-12.5 MG tablet TAKE 1 TABLET BY MOUTH ONCE  DAILY   ONETOUCH VERIO test strip 3 (three) times daily.   pantoprazole (PROTONIX) 40 MG tablet Take 1 tablet (40 mg total) by mouth daily.   sertraline (ZOLOFT) 25 MG tablet Take 1 tablet (25 mg total) by mouth 2 (two) times daily.    Immunization History  Administered Date(s) Administered   Fluad Trivalent(High Dose 65+) 09/24/2023   Influenza,inj,Quad PF,6+ Mos 09/22/2015   Moderna Sars-Covid-2 Vaccination 07/29/2020, 08/31/2020      Objective:     BP 120/68 (BP Location: Right Arm, Cuff Size: Large)   Pulse 71   Temp (!) 97.1 F (36.2 C)   Ht 5\' 6"  (1.676 m)   Wt 245 lb (111.1 kg)   SpO2 95%   BMI 39.54 kg/m   SpO2: 95 % O2 Device: None (Room air)  GENERAL: Obese woman, no acute distress, no conversational dyspnea, fully ambulatory. HEAD: Normocephalic, atraumatic.  EYES: Pupils equal, round, reactive to light.  No scleral icterus.  MOUTH: Dentures both, no thrush.  Oral mucosa moist. NECK: Supple. No thyromegaly. Trachea midline. No JVD.  No adenopathy. PULMONARY: Good air entry bilaterally.  No adventitious sounds. CARDIOVASCULAR: S1 and S2. Regular rate and rhythm.  Grade 2/6 aortic stenosis murmur  present. ABDOMEN: Obese otherwise benign. MUSCULOSKELETAL: No joint deformity, no clubbing, no edema.  NEUROLOGIC: No overt focal deficit, no gait disturbance, speech is fluent.   SKIN: Intact,warm,dry. PSYCH: Mood and behavior normal.        Lab Results  Component Value Date   NITRICOXIDE 23 02/26/2024  *No evidence of type II inflammation.  Ambulatory oxymetry was performed today:  At rest on room air oxygen saturation was 99%, the patient ambulated at a moderate pace, completed 3 laps, O2 nadir 89%, moderate shortness of breath.  Resting heart rate was 78 bpm at maximum for this exercise 103 bpm.   Assessment & Plan:     ICD-10-CM   1. Dyspnea on exertion  R06.09 Pulmonary Function Test [facility] Only    Nitric oxide    2. Opacity of lung on imaging study  R91.8 CT CHEST WO CONTRAST    3. Nonrheumatic aortic valve stenosis  I35.0     4. Nocturnal hypoxemia  G47.34 Overnight Pulse Oximetry Study      Orders Placed This Encounter  Procedures   CT CHEST WO CONTRAST    Standing Status:   Future    Expiration Date:   02/25/2025    Preferred imaging location?:   [facility]   Pulmonary Function Test [facility] Only    Standing Status:   Future    Expected Date:   03/11/2024    Expiration Date:   02/25/2025    Full PFT: includes the following: basic spirometry, spirometry pre & post bronchodilator, diffusion capacity (DLCO), lung volumes:   Full PFT    MIP/MEP:   Yes    This test can only be performed at:   [facility]   Nitric oxide   Overnight Pulse Oximetry Study    On CPAP DME: [employer], CMA 02/26/2024    Standing Status:   Future    Expiration Date:   02/25/2025     Assessment and Plan Dyspnea Severe dyspnea for 4-5 months, worsening in the last 3-4 weeks. Shortness of breath with minimal exertion. Previous imaging showed a lung nodule since 2019. Differential diagnosis includes COPD, heart  failure, and potential postinflammatory pulmonary fibrosis.  Discussed need for CT scan and full pulmonary function tests. Explained walking test for oxygen levels and airway inflammation check. - Order CT scan of the chest - Order full pulmonary function tests  Aortic Stenosis Regular monitoring required. Last echocardiogram 6-7 months ago.  Query contributing to dyspnea. Explained need for careful monitoring to prevent complications. - Order echocardiogram in June  Sleep Apnea Managed with CPAP. Reports needing to add water to CPAP machine in the morning. Machine is 55-66 years old. Discussed re-evaluating CPAP settings and machine functionality. Consider new sleep study. - Re-evaluate CPAP settings and machine functionality - Consider new sleep study -compliance report available after patient left and does not indicate that the patient needs repeat study, obtaining overnight oximetry on CPAP  Follow-up - Schedule follow-up after CT scan and pulmonary function tests - Reassess sleep apnea management after new oximetry study.       Advised if symptoms do not improve or worsen, to please contact office for sooner follow up or seek emergency care.    I spent 60 minutes of dedicated to the care of this patient on the date of this encounter to include pre-visit review of records, face-to-face time with the patient discussing conditions above, post visit ordering of testing, clinical documentation with the electronic health record, making appropriate referrals as documented, and communicating necessary findings to members of the patients care team.   C. Danice Goltz, MD Advanced Bronchoscopy [facility]    *This note was dictated using voice recognition software/Dragon.  Despite best efforts to proofread, errors can occur which can change the meaning. Any transcriptional errors that result from this process are unintentional and may not be fully corrected at the time of dictation.

## 2024-02-26 NOTE — Patient Instructions (Signed)
 VISIT SUMMARY:  Today, we discussed your worsening shortness of breath and reviewed your medical history, including, aortic stenosis, and other conditions. We have planned several tests and adjustments to better understand and manage your symptoms.  YOUR PLAN:  -DYSPNEA: Dyspnea means difficulty breathing. We will conduct full pulmonary function tests, a CT scan of your chest, and a walking test to assess your oxygen levels and airway inflammation.  -AORTIC STENOSIS: Aortic stenosis is a narrowing of the heart's aortic valve. We will continue regular monitoring and have scheduled an echocardiogram for June to assess your condition.  -SLEEP APNEA: Sleep apnea is a condition where breathing repeatedly stops and starts during sleep. We will re-evaluate your CPAP machine and settings, and consider ordering a new sleep study.  -MEDICATION SIDE EFFECTS: Some of your symptoms, like hoarseness and itching, may be side effects of your medications. We will review your current medications and consider alternatives if necessary.  -GENERAL HEALTH MAINTENANCE: Ensure you have an annual echocardiogram for aortic stenosis. INSTRUCTIONS:  Please schedule a follow-up appointment after completing the pulmonary function tests and CT scan. Follow-up in 4 to 6 weeks time call sooner should any new problems arise.

## 2024-03-01 ENCOUNTER — Encounter: Payer: Self-pay | Admitting: Pulmonary Disease

## 2024-03-04 ENCOUNTER — Ambulatory Visit
Admission: RE | Admit: 2024-03-04 | Discharge: 2024-03-04 | Disposition: A | Discharge status: Home / Self Care | Payer: PRIVATE HEALTH INSURANCE | Source: Ambulatory Visit | Attending: Internal Medicine | Admitting: Internal Medicine

## 2024-03-04 DIAGNOSIS — I700 Atherosclerosis of aorta: Secondary | ICD-10-CM | POA: Diagnosis not present

## 2024-03-04 DIAGNOSIS — R918 Other nonspecific abnormal finding of lung field: Secondary | ICD-10-CM

## 2024-03-04 DIAGNOSIS — R0609 Other forms of dyspnea: Secondary | ICD-10-CM | POA: Diagnosis not present

## 2024-03-04 DIAGNOSIS — D3501 Benign neoplasm of right adrenal gland: Secondary | ICD-10-CM | POA: Diagnosis not present

## 2024-03-18 ENCOUNTER — Telehealth: Payer: Self-pay | Admitting: Internal Medicine

## 2024-03-18 ENCOUNTER — Other Ambulatory Visit: Payer: Self-pay | Admitting: Internal Medicine

## 2024-03-18 DIAGNOSIS — B3731 Acute candidiasis of vulva and vagina: Secondary | ICD-10-CM

## 2024-03-18 MED ORDER — FLUCONAZOLE 150 MG PO TABS: 150.0000 mg | ORAL_TABLET | Freq: Every day | ORAL | 0 refills | Status: DC

## 2024-03-18 NOTE — Telephone Encounter (Signed)
 Patient left VM that she believes that she has a yeast infection and has been taking OTC meds and it is not helping. She wants something sent into the pharmacy.  Spoke with patient and she is experiencing small amount of discharge and itching and burning on the "lips of her vagina". She took OTC Monistat and it helped at first but not anymore. Please advise.   [facility] [address]

## 2024-03-21 ENCOUNTER — Telehealth: Payer: Self-pay | Admitting: Pulmonary Disease

## 2024-03-21 NOTE — Telephone Encounter (Signed)
 I have spoke with Taylor Barry about scheduling PFT and she was worrying about the ONO that Dr. Jayme Cloud ordered for her. I sent urgent message to [employer] to see why she hasn't done it yet

## 2024-03-21 NOTE — Telephone Encounter (Signed)
 Copied from CRM [id number]. Topic: Clinical - Prescription Issue >> Mar 21, 2024 11:52 AM Nila Nephew wrote: Reason for CRM: Patient is calling to state that she has never received a PulseOx for the study through [employer] [facility]. Patient states daughter called and never got a call. Patient is concerned she has not gotten it. Patient would like a follow-up on this ASAP. Patient would also like to know if she needs to reschedule her coming appointment, as she is unable to follow up because she has not gotten thing thing to follow up on yet.  Patient requesting a call back to advise regarding appointment and DME supplies. Can be reached at number on file.

## 2024-03-24 ENCOUNTER — Telehealth: Payer: Self-pay | Admitting: Pulmonary Disease

## 2024-03-24 NOTE — Telephone Encounter (Signed)
 Patient reports she has not received a call or the ONO. I have sent an urgent message to [facility].

## 2024-03-24 NOTE — Telephone Encounter (Signed)
 Copied from CRM [id number]. Topic: Appointments - Appointment Cancel/Reschedule >> Mar 24, 2024 10:43 AM Duncan Dull wrote: Patient/patient representative is calling to cancel or reschedule an appointment. Refer to attachments for appointment information.  Patient daughter and patient would like to reschedule PFT 04-03 to a 10am appointment if still available, if not, then the original appointment is fine. Please contact at [phone], please call twice.

## 2024-03-24 NOTE — Telephone Encounter (Signed)
 Copied from CRM [id number]. Topic: General - Other >> Mar 21, 2024  3:13 PM Konrad Dolores wrote: Reason for CRM: Bethann Berkshire [phone] daughter of Joetta Delprado called regarding PFT scheduled for 4.3.2025. I saw that it states [facility] only and per leadership directive I was not able to reschedule the PFT. [name] stated Tashera would prefer an earlier time slot that same day if possible, she believed there was a 10 am time slot that was available when scheduling.

## 2024-03-24 NOTE — Telephone Encounter (Signed)
 I called and had to leave a message for [name] the daughter that all appts on 03/27/24 have been filled as of 03/21/24

## 2024-03-27 ENCOUNTER — Ambulatory Visit: Attending: Pulmonary Disease

## 2024-03-27 DIAGNOSIS — R0609 Other forms of dyspnea: Secondary | ICD-10-CM | POA: Diagnosis not present

## 2024-03-27 DIAGNOSIS — R942 Abnormal results of pulmonary function studies: Secondary | ICD-10-CM | POA: Insufficient documentation

## 2024-03-27 DIAGNOSIS — J849 Interstitial pulmonary disease, unspecified: Secondary | ICD-10-CM | POA: Diagnosis not present

## 2024-03-27 DIAGNOSIS — R0600 Dyspnea, unspecified: Secondary | ICD-10-CM | POA: Diagnosis present

## 2024-03-27 LAB — PULMONARY FUNCTION TEST ARMC ONLY
DL/VA % pred: 65 %
DL/VA: 2.63 ml/min/mmHg/L
DLCO unc % pred: 41 %
DLCO unc: 8.39 ml/min/mmHg
FEF 25-75 Post: 2.03 L/s
FEF 25-75 Pre: 1.58 L/s
FEF2575-%Change-Post: 28 %
FEF2575-%Pred-Post: 121 %
FEF2575-%Pred-Pre: 94 %
FEV1-%Change-Post: 4 %
FEV1-%Pred-Post: 75 %
FEV1-%Pred-Pre: 71 %
FEV1-Post: 1.68 L
FEV1-Pre: 1.6 L
FEV1FVC-%Change-Post: 1 %
FEV1FVC-%Pred-Pre: 108 %
FEV6-%Change-Post: 3 %
FEV6-%Pred-Post: 72 %
FEV6-%Pred-Pre: 69 %
FEV6-Post: 2.05 L
FEV6-Pre: 1.98 L
FEV6FVC-%Pred-Post: 105 %
FEV6FVC-%Pred-Pre: 105 %
FVC-%Change-Post: 3 %
FVC-%Pred-Post: 68 %
FVC-%Pred-Pre: 66 %
FVC-Post: 2.05 L
FVC-Pre: 1.98 L
Post FEV1/FVC ratio: 82 %
Post FEV6/FVC ratio: 100 %
Pre FEV1/FVC ratio: 81 %
Pre FEV6/FVC Ratio: 100 %
RV % pred: 85 %
RV: 2.07 L
TLC % pred: 75 %
TLC: 4.04 L

## 2024-03-27 MED ORDER — ALBUTEROL SULFATE (2.5 MG/3ML) 0.083% IN NEBU
2.5000 mg | INHALATION_SOLUTION | Freq: Once | RESPIRATORY_TRACT | Status: AC
  Administered 2024-03-27: 2.5 mg via RESPIRATORY_TRACT
  Filled 2024-03-27: qty 3

## 2024-03-28 ENCOUNTER — Ambulatory Visit: Payer: PRIVATE HEALTH INSURANCE | Admitting: Pulmonary Disease

## 2024-03-31 ENCOUNTER — Other Ambulatory Visit: Payer: Self-pay | Admitting: Internal Medicine

## 2024-03-31 ENCOUNTER — Telehealth: Payer: Self-pay | Admitting: Internal Medicine

## 2024-03-31 DIAGNOSIS — F411 Generalized anxiety disorder: Secondary | ICD-10-CM

## 2024-03-31 DIAGNOSIS — E1169 Type 2 diabetes mellitus with other specified complication: Secondary | ICD-10-CM

## 2024-03-31 DIAGNOSIS — K219 Gastro-esophageal reflux disease without esophagitis: Secondary | ICD-10-CM

## 2024-03-31 DIAGNOSIS — E1165 Type 2 diabetes mellitus with hyperglycemia: Secondary | ICD-10-CM

## 2024-03-31 DIAGNOSIS — I152 Hypertension secondary to endocrine disorders: Secondary | ICD-10-CM

## 2024-03-31 NOTE — Telephone Encounter (Signed)
 Patient left VM wanting to know if she needs labs before her appointment this Thursday? Please advise.

## 2024-04-03 ENCOUNTER — Other Ambulatory Visit

## 2024-04-03 ENCOUNTER — Other Ambulatory Visit: Payer: Self-pay

## 2024-04-03 ENCOUNTER — Encounter: Payer: Self-pay | Admitting: Internal Medicine

## 2024-04-03 ENCOUNTER — Ambulatory Visit (INDEPENDENT_AMBULATORY_CARE_PROVIDER_SITE_OTHER): Admitting: Internal Medicine

## 2024-04-03 ENCOUNTER — Ambulatory Visit: Payer: Medicare Other | Admitting: Internal Medicine

## 2024-04-03 VITALS — BP 131/72 | HR 75 | Ht 66.0 in | Wt 242.0 lb

## 2024-04-03 DIAGNOSIS — E1159 Type 2 diabetes mellitus with other circulatory complications: Secondary | ICD-10-CM | POA: Diagnosis not present

## 2024-04-03 DIAGNOSIS — E782 Mixed hyperlipidemia: Secondary | ICD-10-CM

## 2024-04-03 DIAGNOSIS — E1165 Type 2 diabetes mellitus with hyperglycemia: Secondary | ICD-10-CM | POA: Diagnosis not present

## 2024-04-03 DIAGNOSIS — F411 Generalized anxiety disorder: Secondary | ICD-10-CM | POA: Diagnosis not present

## 2024-04-03 DIAGNOSIS — F419 Anxiety disorder, unspecified: Secondary | ICD-10-CM

## 2024-04-03 DIAGNOSIS — J849 Interstitial pulmonary disease, unspecified: Secondary | ICD-10-CM

## 2024-04-03 DIAGNOSIS — I152 Hypertension secondary to endocrine disorders: Secondary | ICD-10-CM

## 2024-04-03 DIAGNOSIS — K219 Gastro-esophageal reflux disease without esophagitis: Secondary | ICD-10-CM

## 2024-04-03 DIAGNOSIS — E1169 Type 2 diabetes mellitus with other specified complication: Secondary | ICD-10-CM | POA: Diagnosis not present

## 2024-04-03 LAB — POCT CBG (FASTING - GLUCOSE)-MANUAL ENTRY: Glucose Fasting, POC: 156 mg/dL — AB (ref 70–99)

## 2024-04-03 NOTE — Progress Notes (Signed)
 Established Patient Office Visit  Subjective:  Patient ID: Taylor Barry, female    DOB: Apr 20, 1946  Age: 78 y.o. MRN: [id number]  Chief Complaint  Patient presents with   Follow-up    3 month follow up     Patient comes in for follow-up today.  She is very anxious today and complains about difficulty breathing which is slowly getting worse over the last few weeks.  She has been under the care of a pulmonologist but recently went for a second opinion.  Repeat CT scan has been ordered along with PFTs.  Patient has a follow-up with the pulmonologist.  CT shows findings suggestive of severe interstitial lung disease.  Patient is supposed to get nocturnal pulse ox.  All the results will be discussed in detail along with management options by the pulmonologist. Patient is due for blood work today.    No other concerns at this time.   Past Medical History:  Diagnosis Date   Allergic rhinitis with postnasal drip    Anxiety and depression    Arthritis    Arthritis, multiple joint involvement    Bronchitis, acute, with bronchospasm    Hair loss    HTN, goal below 150/90    Hyperlipidemia LDL goal <100    Obstructive sleep apnea, adult    CPAP   Right adrenal mass (HCC)    Vitamin D deficiency     Past Surgical History:  Procedure Laterality Date   ABDOMINAL HYSTERECTOMY  1968   total   COLONOSCOPY WITH PROPOFOL N/A 04/15/2021   Procedure: COLONOSCOPY WITH PROPOFOL;  Surgeon: Midge Minium, MD;  Location: [facility];  Service: Endoscopy;  Laterality: N/A;  [facility] - oral meds priority 4   REPLACEMENT TOTAL KNEE Bilateral    Right knee first, Left knee more recent   TYMPANOPLASTY Right 1977    Social History   Socioeconomic History   Marital status: Divorced    Spouse name: Not on file   Number of children: Not on file   Years of education: Not on file   Highest education level: Not on file  Occupational History   Not on file  Tobacco Use   Smoking status:  Former    Current packs/day: 0.00    Types: Cigarettes    Quit date: 01/26/1992    Years since quitting: 32.2   Smokeless tobacco: Never   Tobacco comments:    Started smoking in the mid 80's    Smoked 0.25 PPD at the heaviest.      Vaping Use   Vaping status: Never Used  Substance and Sexual Activity   Alcohol use: No    Alcohol/week: 0.0 standard drinks of alcohol   Drug use: No   Sexual activity: Never  Other Topics Concern   Not on file  Social History Narrative   Not on file   Social Drivers of Health   Financial Resource Strain: Low Risk  (01/01/2024)   Received from [facility]   Overall Financial Resource Strain (CARDIA)    Difficulty of Paying Living Expenses: Not hard at all  Food Insecurity: No Food Insecurity (01/01/2024)   Received from [facility]   Hunger Vital Sign    Worried About Running Out of Food in the Last Year: Never true    Ran Out of Food in the Last Year: Never true  Transportation Needs: No Transportation Needs (01/01/2024)   Received from [facility]   PRAPARE -  Transportation    In the past 12 months, has lack of transportation kept you from medical appointments or from getting medications?: No    Lack of Transportation (Non-Medical): No  Physical Activity: Not on file  Stress: Not on file  Social Connections: Unknown (05/09/2022)   Received from [facility]   Social Network    Social Network: Not on file  Intimate Partner Violence: Unknown (03/31/2022)   Received from [facility]   HITS    Physically Hurt: Not on file    Insult or Talk Down To: Not on file    Threaten Physical Harm: Not on file    Scream or Curse: Not on file    Family History  Problem Relation Age of Onset   Heart disease Mother    Alcohol abuse Father    Diabetes Daughter    Breast cancer Maternal Aunt    Cancer Brother    Cancer Maternal Grandmother    Cancer Maternal Grandfather      Allergies  Allergen Reactions   Acetaminophen     Pt denies   Codeine Nausea Only   Hydrocodone-Acetaminophen Other (See Comments)    "Puts me up the wall"   Oxycodone Hcl    Oxycodone-Acetaminophen     Hallucination   Prednisolone     Hallucination   Statins     Outpatient Medications Prior to Visit  Medication Sig   albuterol (VENTOLIN HFA) 108 (90 Base) MCG/ACT inhaler Inhale 2 puffs into the lungs every 6 (six) hours as needed.   aspirin 81 MG chewable tablet Chew by mouth. (Patient not taking: Reported on 09/20/2023)   Bempedoic Acid (NEXLETOL) 180 MG TABS Take 1 tablet (180 mg total) by mouth daily.   diazepam (VALIUM) 5 MG tablet Take half or one tablet once a day PRN only for severe anxiety (Patient not taking: Reported on 02/26/2024)   fenofibrate (TRICOR) 145 MG tablet Take 1 tablet (145 mg total) by mouth daily.   fluconazole (DIFLUCAN) 150 MG tablet Take 1 tablet (150 mg total) by mouth daily.   fluocinonide (LIDEX) 0.05 % external solution Apply 1-2 drops to affected areas scalp once to twice daily as needed for itch. Avoid face. (Patient not taking: Reported on 08/17/2023)   hydrocortisone 2.5 % cream Apply topically to affected areas qd/bid prn for rash  Use as directed in handout (Patient not taking: Reported on 02/26/2024)   hydrOXYzine (ATARAX) 10 MG tablet Take 1 tablet (10 mg total) by mouth 3 (three) times daily as needed.   ibuprofen (ADVIL) 800 MG tablet SMARTSIG:1 Tablet(s) By Mouth 1 to 3 Times Daily PRN   JARDIANCE 25 MG TABS tablet TAKE 1 TABLET BY MOUTH DAILY   ketoconazole (NIZORAL) 2 % cream Apply topically qd/bid daily as needed for rash.  Use as directed in handout (Patient not taking: Reported on 02/26/2024)   ketoconazole (NIZORAL) 2 % shampoo Massage into scalp 3 times a week, let sit 5 minutes, then rinse. (Patient not taking: Reported on 08/17/2023)   losartan-hydrochlorothiazide (HYZAAR) 100-12.5 MG tablet TAKE 1 TABLET BY MOUTH ONCE  DAILY   Multiple  Vitamins-Minerals (MULTIVITAMIN WITH MINERALS) tablet Take 1 tablet by mouth daily. (Patient not taking: Reported on 08/17/2023)   ONETOUCH VERIO test strip 3 (three) times daily.   pantoprazole (PROTONIX) 40 MG tablet Take 1 tablet (40 mg total) by mouth daily.   pregabalin (LYRICA) 25 MG capsule Take 1 capsule (25 mg total) by mouth 2 (two) times  daily. (Patient not taking: Reported on 02/26/2024)   sertraline (ZOLOFT) 25 MG tablet Take 1 tablet (25 mg total) by mouth 2 (two) times daily.   spironolactone (ALDACTONE) 25 MG tablet Take 0.5 tablets (12.5 mg total) by mouth daily. (Patient not taking: Reported on 02/26/2024)   No facility-administered medications prior to visit.    Review of Systems  Constitutional: Negative.  Negative for chills, fever, malaise/fatigue and weight loss.  HENT: Negative.  Negative for congestion and sore throat.   Eyes: Negative.   Respiratory:  Positive for shortness of breath. Negative for cough.   Cardiovascular: Negative.  Negative for chest pain, palpitations and leg swelling.  Gastrointestinal: Negative.  Negative for abdominal pain, constipation, diarrhea, heartburn, nausea and vomiting.  Genitourinary: Negative.  Negative for dysuria and flank pain.  Musculoskeletal: Negative.  Negative for joint pain and myalgias.  Skin: Negative.   Neurological: Negative.  Negative for dizziness, tingling and headaches.  Endo/Heme/Allergies: Negative.   Psychiatric/Behavioral: Negative.  Negative for depression and suicidal ideas. The patient is not nervous/anxious.        Objective:   BP 131/72   Pulse 75   Ht 5\' 6"  (1.676 m)   Wt 242 lb (109.8 kg)   SpO2 95%   BMI 39.06 kg/m   Vitals:   04/03/24 0858  BP: 131/72  Pulse: 75  Height: 5\' 6"  (1.676 m)  Weight: 242 lb (109.8 kg)  SpO2: 95%  BMI (Calculated): 39.08    Physical Exam Vitals and nursing note reviewed.  Constitutional:      Appearance: Normal appearance.  HENT:     Head: Normocephalic  and atraumatic.     Nose: Nose normal.     Mouth/Throat:     Mouth: Mucous membranes are moist.     Pharynx: Oropharynx is clear.  Eyes:     Conjunctiva/sclera: Conjunctivae normal.     Pupils: Pupils are equal, round, and reactive to light.  Cardiovascular:     Rate and Rhythm: Normal rate and regular rhythm.     Pulses: Normal pulses.     Heart sounds: Normal heart sounds. No murmur heard. Pulmonary:     Effort: Pulmonary effort is normal.     Breath sounds: Normal breath sounds. No wheezing.  Abdominal:     General: Bowel sounds are normal.     Palpations: Abdomen is soft.     Tenderness: There is no abdominal tenderness. There is no right CVA tenderness or left CVA tenderness.  Musculoskeletal:        General: Normal range of motion.     Cervical back: Normal range of motion.     Right lower leg: No edema.     Left lower leg: No edema.  Skin:    General: Skin is warm and dry.  Neurological:     General: No focal deficit present.     Mental Status: She is alert and oriented to person, place, and time.  Psychiatric:        Mood and Affect: Mood normal.        Behavior: Behavior normal.      Results for orders placed or performed in visit on 04/03/24  POCT CBG (Fasting - Glucose)  Result Value Ref Range   Glucose Fasting, POC 156 (A) 70 - 99 mg/dL    Recent Results (from the past 2160 hours)  Nitric oxide     Status: None   Collection Time: 02/26/24  2:11 PM  Result Value Ref Range  Nitric Oxide 23   Pulmonary Function Test [facility] Only     Status: None   Collection Time: 03/27/24  2:46 PM  Result Value Ref Range   FVC-Pre 1.98 L   FVC-%Pred-Pre 66 %   FVC-Post 2.05 L   FVC-%Pred-Post 68 %   FVC-%Change-Post 3 %   FEV1-Pre 1.60 L   FEV1-%Pred-Pre 71 %   FEV1-Post 1.68 L   FEV1-%Pred-Post 75 %   FEV1-%Change-Post 4 %   FEV6-Pre 1.98 L   FEV6-%Pred-Pre 69 %   FEV6-Post 2.05 L   FEV6-%Pred-Post 72 %   FEV6-%Change-Post 3 %   Pre FEV1/FVC ratio 81 %    FEV1FVC-%Pred-Pre 108 %   Post FEV1/FVC ratio 82 %   FEV1FVC-%Change-Post 1 %   Pre FEV6/FVC Ratio 100 %   FEV6FVC-%Pred-Pre 105 %   Post FEV6/FVC ratio 100 %   FEV6FVC-%Pred-Post 105 %   FEF 25-75 Pre 1.58 L/sec   FEF2575-%Pred-Pre 94 %   FEF 25-75 Post 2.03 L/sec   FEF2575-%Pred-Post 121 %   FEF2575-%Change-Post 28 %   RV 2.07 L   RV % pred 85 %   TLC 4.04 L   TLC % pred 75 %   DLCO unc 8.39 ml/min/mmHg   DLCO unc % pred 41 %   DL/VA 2.95 ml/min/mmHg/L   DL/VA % pred 65 %  POCT CBG (Fasting - Glucose)     Status: Abnormal   Collection Time: 04/03/24  9:05 AM  Result Value Ref Range   Glucose Fasting, POC 156 (A) 70 - 99 mg/dL      Assessment & Plan:  Patient reassured.  Check blood work today.  Follow-up with pulmonologist for further management. Problem List Items Addressed This Visit     Hypertension associated with diabetes (HCC) - Primary   Relevant Orders   CMP14+EGFR   Esophageal reflux   Relevant Orders   CBC with Diff   Type 2 diabetes mellitus with hyperglycemia, without long-term current use of insulin (HCC)   Relevant Orders   POCT CBG (Fasting - Glucose) (Completed)   Hemoglobin A1c   GAD (generalized anxiety disorder)   Combined hyperlipidemia associated with type 2 diabetes mellitus (HCC)   Relevant Orders   Lipid Panel w/o Chol/HDL Ratio   Other Visit Diagnoses       Interstitial lung disease (HCC)           Return in about 2 weeks (around 04/17/2024).   Total time spent: 30 minutes  Margaretann Loveless, MD  04/03/2024   This document may have been prepared by [facility] Voice Recognition software and as such may include unintentional dictation errors.

## 2024-04-04 LAB — CBC WITH DIFFERENTIAL/PLATELET
Basophils Absolute: 0.1 10*3/uL (ref 0.0–0.2)
Basos: 1 %
EOS (ABSOLUTE): 0.2 10*3/uL (ref 0.0–0.4)
Eos: 3 %
Hematocrit: 38.8 % (ref 34.0–46.6)
Hemoglobin: 12.3 g/dL (ref 11.1–15.9)
Immature Grans (Abs): 0 10*3/uL (ref 0.0–0.1)
Immature Granulocytes: 0 %
Lymphocytes Absolute: 2.1 10*3/uL (ref 0.7–3.1)
Lymphs: 37 %
MCH: 29.6 pg (ref 26.6–33.0)
MCHC: 31.7 g/dL (ref 31.5–35.7)
MCV: 93 fL (ref 79–97)
Monocytes Absolute: 0.5 10*3/uL (ref 0.1–0.9)
Monocytes: 8 %
Neutrophils Absolute: 2.9 10*3/uL (ref 1.4–7.0)
Neutrophils: 51 %
Platelets: 314 10*3/uL (ref 150–450)
RBC: 4.16 x10E6/uL (ref 3.77–5.28)
RDW: 13.8 % (ref 11.7–15.4)
WBC: 5.8 10*3/uL (ref 3.4–10.8)

## 2024-04-04 LAB — CMP14+EGFR
ALT: 18 IU/L (ref 0–32)
AST: 28 IU/L (ref 0–40)
Albumin: 4.3 g/dL (ref 3.8–4.8)
Alkaline Phosphatase: 83 IU/L (ref 44–121)
BUN/Creatinine Ratio: 18 (ref 12–28)
BUN: 20 mg/dL (ref 8–27)
Bilirubin Total: 0.3 mg/dL (ref 0.0–1.2)
CO2: 20 mmol/L (ref 20–29)
Calcium: 10.2 mg/dL (ref 8.7–10.3)
Chloride: 101 mmol/L (ref 96–106)
Creatinine, Ser: 1.09 mg/dL — ABNORMAL HIGH (ref 0.57–1.00)
Globulin, Total: 3.2 g/dL (ref 1.5–4.5)
Glucose: 167 mg/dL — ABNORMAL HIGH (ref 70–99)
Potassium: 4.7 mmol/L (ref 3.5–5.2)
Sodium: 136 mmol/L (ref 134–144)
Total Protein: 7.5 g/dL (ref 6.0–8.5)
eGFR: 52 mL/min/{1.73_m2} — ABNORMAL LOW (ref >59)

## 2024-04-04 LAB — LIPID PANEL W/O CHOL/HDL RATIO
Cholesterol, Total: 173 mg/dL (ref 100–199)
HDL: 12 mg/dL — ABNORMAL LOW (ref >39)
LDL Chol Calc (NIH): 93 mg/dL (ref 0–99)
Triglycerides: 405 mg/dL — ABNORMAL HIGH (ref 0–149)
VLDL Cholesterol Cal: 68 mg/dL — ABNORMAL HIGH (ref 5–40)

## 2024-04-04 LAB — HEMOGLOBIN A1C
Est. average glucose Bld gHb Est-mCnc: 171 mg/dL
Hgb A1c MFr Bld: 7.6 % — ABNORMAL HIGH (ref 4.8–5.6)

## 2024-04-15 ENCOUNTER — Telehealth: Payer: Self-pay

## 2024-04-15 NOTE — Telephone Encounter (Signed)
 I have called Mitch with [employer]. He said he will get an urgent ticket in and have them send out to the ONO today or tomorrow. He assured me someone would reach out to the patient today.  I have notified the patient. She will call us  back tomorrow if she still has not heard from [employer]. I did tell her she could keep the appt on 4/24 and Dr. Viva Grise could go over her PFT results with her. She said her daughter that keeps up with all of her medical information is having surgery and she would like to wait until she can come in with her. She wanted to cancel the appt for 4/24 and she will call back to reschedule once her daughter is feeling better.  Appt has been canceled.  Nothing further needed.

## 2024-04-15 NOTE — Telephone Encounter (Signed)
 Copied from CRM [id number]. Topic: General - Other >> Apr 15, 2024  1:39 PM Eveleen Hinds B wrote: Reason for CRM: Patient called in frustrated. She needs the pulse ox before coming in to see [facility]. Refuse to give credit card to company supplying and she states she cannot drive to pick it up and then bring it back. Would like someone to call her and discuss situation and see if there is another option for her.

## 2024-04-15 NOTE — Telephone Encounter (Signed)
 I spoke with the patient. She said she will not put a credit card down to have the ONO done through [employer].   Per Mitch with [employer], the require it now.  Taylor Barry can you please send the order to another DME company and see if they require a credit card to be put down.  Thank you.

## 2024-04-15 NOTE — Telephone Encounter (Signed)
 Copied from CRM [id number]. Topic: Clinical - Medical Advice >> Apr 15, 2024  9:10 AM Justina Oman C wrote: Reason for CRM: Patient [phone] states never received the PulseOx from [employer]. Patient, patient's daughter and pcp has called about this issue and still no response it's been a month. Patient wants to know if she needs to come in for appointment 04/17/24 at 2:45 pm, or reschedule? Patient states she's still having breathing issues, winded easily but better without 2 off the medications. Please advise and call back today.

## 2024-04-16 NOTE — Telephone Encounter (Signed)
 I have sent a message to [facility] to see what they tell me

## 2024-04-17 ENCOUNTER — Ambulatory Visit: Payer: PRIVATE HEALTH INSURANCE | Admitting: Pulmonary Disease

## 2024-04-17 ENCOUNTER — Ambulatory Visit: Admitting: Internal Medicine

## 2024-04-22 DIAGNOSIS — R0902 Hypoxemia: Secondary | ICD-10-CM | POA: Diagnosis not present

## 2024-04-28 ENCOUNTER — Encounter: Payer: Self-pay | Admitting: Pulmonary Disease

## 2024-04-28 NOTE — Telephone Encounter (Signed)
 I have notified the patient and scheduled her an appt with Dr. Kieran Pellet.  Nothing further needed.

## 2024-04-28 NOTE — Telephone Encounter (Signed)
 ONO reviewed by Dr. Viva Grise- No need to bleed in O2 with CPAP. Consider sleep evaluation follow up with Dr. Kieran Pellet.   LMTCB. E2C2 please advise when patient calls back.

## 2024-05-05 ENCOUNTER — Ambulatory Visit: Admitting: Sleep Medicine

## 2024-05-08 ENCOUNTER — Encounter: Payer: Self-pay | Admitting: Sleep Medicine

## 2024-05-08 ENCOUNTER — Telehealth: Payer: Self-pay | Admitting: Pulmonary Disease

## 2024-05-08 ENCOUNTER — Ambulatory Visit: Admitting: Sleep Medicine

## 2024-05-08 VITALS — BP 112/80 | HR 80 | Temp 98.2°F | Ht 64.0 in | Wt 242.8 lb

## 2024-05-08 DIAGNOSIS — G4733 Obstructive sleep apnea (adult) (pediatric): Secondary | ICD-10-CM

## 2024-05-08 DIAGNOSIS — E119 Type 2 diabetes mellitus without complications: Secondary | ICD-10-CM

## 2024-05-08 DIAGNOSIS — I10 Essential (primary) hypertension: Secondary | ICD-10-CM

## 2024-05-08 MED ORDER — TIRZEPATIDE-WEIGHT MANAGEMENT 2.5 MG/0.5ML ~~LOC~~ SOLN: 2.5000 mg | SUBCUTANEOUS | 2 refills | Status: DC

## 2024-05-08 NOTE — Telephone Encounter (Signed)
 Pt states she has not seen Dr. Viva Grise since all the testing has been completed. Does not remember Taylor Barry mentioning the ONO results. Upset Dr. Kieran Pellet never looked at her machine even though she was told to bring to her appointment. Decided not to make a follow up appointment

## 2024-05-08 NOTE — Progress Notes (Signed)
 *-      Name:Taylor Barry MRN: [id number] DOB: 08-Oct-1946   CHIEF COMPLAINT:  CPAP F/U   HISTORY OF PRESENT ILLNESS:  Taylor Barry is a 78 y.o. w/ a h/o OSA, morbid obesity, anxiety, depression and HTN who presents for CPAP follow up visit. Reports using CPAP therapy every night, which is confirmed by compliance data. She is currently using a nasal mask, which is comfortable.  for c/o loud snoring and excessive daytime sleepiness which has been present for several years. Reports nocturnal awakenings due to unclear reasons, however does not have difficulty falling back to sleep. Denies any significant weight changes. Denies morning headaches, RLS symptoms, dream enactment, cataplexy, hypnagogic or hypnapompic hallucinations. Reports a family history of sleep apnea. Denies drowsy driving. Drinks 1-2 sodas daily, occasional alcohol use, former smoker, denies illicit drug use.   Bedtime 11 pm Sleep onset 10 mins Rise time 6:30-7:30 am   EPWORTH SLEEP SCORE 0     No data to display          PAST MEDICAL HISTORY :   has a past medical history of Allergic rhinitis with postnasal drip, Anxiety and depression, Arthritis, Arthritis, multiple joint involvement, Bronchitis, acute, with bronchospasm, Hair loss, HTN, goal below 150/90, Hyperlipidemia LDL goal <100, Obstructive sleep apnea, adult, Right adrenal mass (HCC), and Vitamin D deficiency.  has a past surgical history that includes Tympanoplasty (Right, 1977); Abdominal hysterectomy (1968); Replacement total knee (Bilateral); and Colonoscopy with propofol  (N/A, 04/15/2021). Prior to Admission medications   Medication Sig Start Date End Date Taking? Authorizing Provider  albuterol  (VENTOLIN  HFA) 108 (90 Base) MCG/ACT inhaler Inhale 2 puffs into the lungs every 6 (six) hours as needed. 12/07/23  Yes [provider]  Bempedoic Acid  (NEXLETOL ) 180 MG TABS Take 1 tablet (180 mg total) by mouth daily. 03/08/23  Yes Aisha Hove, MD   fenofibrate  (TRICOR ) 145 MG tablet Take 1 tablet (145 mg total) by mouth daily. 01/08/24  Yes Aisha Hove, MD  losartan -hydrochlorothiazide  (HYZAAR) 100-12.5 MG tablet TAKE 1 TABLET BY MOUTH ONCE  DAILY 07/30/23  Yes Aisha Hove, MD  [facility] VERIO test strip 3 (three) times daily. 02/18/21  Yes [provider]  pantoprazole  (PROTONIX ) 40 MG tablet Take 1 tablet (40 mg total) by mouth daily. 01/04/24  Yes Aisha Hove, MD  sertraline  (ZOLOFT ) 25 MG tablet Take 1 tablet (25 mg total) by mouth 2 (two) times daily. 01/04/24  Yes Aisha Hove, MD  aspirin 81 MG chewable tablet Chew by mouth. Patient not taking: Reported on 05/08/2024    [provider]  diazepam  (VALIUM ) 5 MG tablet Take half or one tablet once a day PRN only for severe anxiety Patient not taking: Reported on 05/08/2024 09/28/23   Aisha Hove, MD  fluconazole  (DIFLUCAN ) 150 MG tablet Take 1 tablet (150 mg total) by mouth daily. Patient not taking: Reported on 05/08/2024 03/18/24   Aisha Hove, MD  fluocinonide  (LIDEX ) 0.05 % external solution Apply 1-2 drops to affected areas scalp once to twice daily as needed for itch. Avoid face. Patient not taking: Reported on 05/08/2024 07/26/22   Artemio Larry, MD  hydrocortisone  2.5 % cream Apply topically to affected areas qd/bid prn for rash  Use as directed in handout Patient not taking: Reported on 05/08/2024 11/12/23   Artemio Larry, MD  hydrOXYzine  (ATARAX ) 10 MG tablet Take 1 tablet (10 mg total) by mouth 3 (three) times daily as needed. Patient not taking: Reported on  05/08/2024 08/17/23   Aisha Hove, MD  ibuprofen (ADVIL) 800 MG tablet SMARTSIG:1 Tablet(s) By Mouth 1 to 3 Times Daily PRN Patient not taking: Reported on 05/08/2024 01/16/21   [provider]  JARDIANCE  25 MG TABS tablet TAKE 1 TABLET BY MOUTH DAILY Patient not taking: Reported on 05/08/2024 01/31/24   Aisha Hove, MD  ketoconazole  (NIZORAL ) 2 % cream Apply topically qd/bid daily as needed  for rash.  Use as directed in handout Patient not taking: Reported on 05/08/2024 11/12/23   Artemio Larry, MD  ketoconazole  (NIZORAL ) 2 % shampoo Massage into scalp 3 times a week, let sit 5 minutes, then rinse. Patient not taking: Reported on 05/08/2024 07/26/22   Artemio Larry, MD  Multiple Vitamins-Minerals (MULTIVITAMIN WITH MINERALS) tablet Take 1 tablet by mouth daily. Patient not taking: Reported on 05/08/2024    [provider]  pregabalin  (LYRICA ) 25 MG capsule Take 1 capsule (25 mg total) by mouth 2 (two) times daily. Patient not taking: Reported on 05/08/2024 01/04/24   Aisha Hove, MD  spironolactone  (ALDACTONE ) 25 MG tablet Take 0.5 tablets (12.5 mg total) by mouth daily. Patient not taking: Reported on 05/08/2024 06/11/23 06/10/24  Cherrie Cornwall, MD   Allergies  Allergen Reactions   Acetaminophen      Pt denies   Codeine Nausea Only   Hydrocodone -Acetaminophen  Other (See Comments)    "Puts me up the wall"   Oxycodone Hcl    Oxycodone-Acetaminophen      Hallucination   Prednisolone     Hallucination   Statins     FAMILY HISTORY:  family history includes Alcohol abuse in her father; Breast cancer in her maternal aunt; Cancer in her brother, maternal grandfather, and maternal grandmother; Diabetes in her daughter; Heart disease in her mother. SOCIAL HISTORY:  reports that she quit smoking about 32 years ago. Her smoking use included cigarettes. She has never used smokeless tobacco. She reports that she does not drink alcohol and does not use drugs.   Review of Systems:  Gen:  Denies  fever, sweats, chills weight loss  HEENT: Denies blurred vision, double vision, ear pain, eye pain, hearing loss, nose bleeds, sore throat Cardiac:  No dizziness, chest pain or heaviness, chest tightness,edema, No JVD Resp:   No cough, -sputum production, -shortness of breath,-wheezing, -hemoptysis,  Gi: Denies swallowing difficulty, stomach pain, nausea or vomiting, diarrhea,  constipation, bowel incontinence Gu:  Denies bladder incontinence, burning urine Ext:   Denies Joint pain, stiffness or swelling Skin: Denies  skin rash, easy bruising or bleeding or hives Endoc:  Denies polyuria, polydipsia , polyphagia or weight change Psych:   Denies depression, insomnia or hallucinations  Other:  All other systems negative  VITAL SIGNS: BP 112/80 (BP Location: Right Arm, Patient Position: Sitting, Cuff Size: Large)   Pulse 80   Temp 98.2 F (36.8 C) (Oral)   Ht 5\' 4"  (1.626 m)   Wt 242 lb 12.8 oz (110.1 kg)   SpO2 90%   BMI 41.68 kg/m    Physical Examination:   General Appearance: No distress  EYES PERRLA, EOM intact.   NECK Supple, No JVD Pulmonary: normal breath sounds, No wheezing.  CardiovascularNormal S1,S2.  No m/r/g.   Abdomen: Benign, Soft, non-tender. Skin:   warm, no rashes, no ecchymosis  Extremities: normal, no cyanosis, clubbing. Neuro:without focal findings,  speech normal  PSYCHIATRIC: Mood, affect within normal limits.   ASSESSMENT AND PLAN  OSA Patient is using and benefiting from CPAP therapy. Discussed the  consequences of untreated sleep apnea. Advised not to drive drowsy for safety of patient and others. Will follow up in 1 year.    DMII Advised patient to discuss starting on Mounjaro for weight loss with PCP.   Morbid obesity Counseled patient on diet and lifestyle modification.    Patient  satisfied with Plan of action and management. All questions answered  I spent a total of 55 minutes reviewing chart data, face-to-face evaluation with the patient, counseling and coordination of care as detailed above.    Piper Hassebrock, M.D.  Sleep Medicine [facility]

## 2024-05-08 NOTE — Patient Instructions (Signed)

## 2024-05-08 NOTE — Telephone Encounter (Signed)
 Attempting to gather additional information before contacting patient.  Dr. Kieran Pellet, were you able to obtain and review a download?

## 2024-05-14 ENCOUNTER — Encounter: Payer: Self-pay | Admitting: Pulmonary Disease

## 2024-05-14 ENCOUNTER — Ambulatory Visit (INDEPENDENT_AMBULATORY_CARE_PROVIDER_SITE_OTHER): Admitting: Pulmonary Disease

## 2024-05-14 ENCOUNTER — Other Ambulatory Visit: Payer: Self-pay | Admitting: Internal Medicine

## 2024-05-14 VITALS — BP 150/86 | HR 72 | Temp 97.7°F | Ht 64.0 in | Wt 241.8 lb

## 2024-05-14 DIAGNOSIS — R0609 Other forms of dyspnea: Secondary | ICD-10-CM

## 2024-05-14 DIAGNOSIS — G4733 Obstructive sleep apnea (adult) (pediatric): Secondary | ICD-10-CM

## 2024-05-14 DIAGNOSIS — Z1231 Encounter for screening mammogram for malignant neoplasm of breast: Secondary | ICD-10-CM

## 2024-05-14 DIAGNOSIS — J849 Interstitial pulmonary disease, unspecified: Secondary | ICD-10-CM

## 2024-05-14 NOTE — Patient Instructions (Signed)
 VISIT SUMMARY:  Today, we discussed your recent improvement in breathing since stopping pregabalin  and Jardiance . We reviewed your history of severe COVID-19 infections, past pneumonia, and family history of diabetes-related complications. We also addressed your concerns about your CPAP machine and new allergy symptoms.  YOUR PLAN:  -POST-INFLAMMATORY PULMONARY FIBROSIS: This condition involves scarring of the lungs following inflammation, likely due to your past severe COVID-19 infections and pneumonia. We will monitor this with a high-resolution CT scan before your next visit.  -SLEEP APNEA: Sleep apnea is a condition where breathing repeatedly stops and starts during sleep. You are managing this well with your CPAP machine, and you no longer need supplemental oxygen. We discussed Zepbound, but it is not suitable for you due to your diabetes.  -DIABETES MELLITUS: Diabetes is a condition that affects how your body processes blood sugar. We stopped Jardiance  due to side effects like shortness of breath and hoarseness. We also discussed your family history of diabetes-related complications and the importance of monitoring your health closely.  -PNEUMONIA: Pneumonia is an infection that inflames the air sacs in one or both lungs. Your pneumonia from June 2024 has resolved, and there are no current issues.  -GOALS OF CARE: We talked about the side effects of your medications and your family history of diabetes-related complications. Our focus is on managing your current conditions without worsening side effects.  INSTRUCTIONS:  We will order a high-resolution CT scan to monitor your lung scarring and blood work to check your immune function. Please schedule a follow-up appointment in 3-4 months.

## 2024-05-14 NOTE — Progress Notes (Signed)
 Subjective:    Patient ID: Taylor Barry, female    DOB: [birth year], 78 y.o.   MRN: [id number]  Patient Care Team: Aisha Hove, MD as PCP - General (Internal Medicine)  Chief Complaint  Patient presents with   Follow-up    Shortness of breath on exertion. Occasional cough. No wheezing.     BACKGROUND/INTERVAL: Patient is a 78 year old remote former smoker previously followed by Dr. Alain Aliment who presents for follow-up after initial evaluation here on 26 February 2024.  She has a history of interstitial lung disease and obstructive sleep apnea.  Interstitial lung disease does not fit UIP criteria.  HPI Discussed the use of AI scribe software for clinical note transcription with the patient, who gave verbal consent to proceed.  History of Present Illness   Taylor Barry is a 78 year old female who presents with shortness of breath.  She has experienced significant improvement in her shortness of breath since discontinuing pregabalin  and Jardiance  in February 2025. Previously, she would become breathless even while talking on the phone and experienced hoarseness leading to loss of voice. Her compliance with CPAP therapy is good, and she no longer requires supplemental oxygen with the machine.  She has a history of severe COVID-19 infections on two occasions and experienced pneumonia in June 2024, which resolved. Her breathing tests have not deteriorated since the last evaluation.  There is a family history of diabetes and related complications. Two of her sisters, who were also diabetics, passed away after experiencing breathing difficulties while on Jardiance . Her brother, also a diabetic, died while on kidney dialysis. She is concerned about these familial patterns and their potential implications for her health.  She has been experiencing issues with her CPAP machine, specifically with the heat setting, and has been adjusting it to her comfort. She has not been bothered by allergies  until this year, which she attributes to her breathing issues possibly related to medication use.   CPAP compliance is set at 100%.    DATA 06/20/2023 CT chest without contrast: Irregular peribronchovascular consolidation of the left upper lobe with mild subpleural consolidation of the anterior right upper lobe likely infectious or inflammatory.  Short-term follow-up recommended.  Solid right upper lobe pulmonary nodule during 7 mm.  Indeterminate right adrenal gland nodule measuring 2.9 cm. 03/04/2024 CT chest without contrast: Previously visualized peribronchovascular consolidation on the left upper lobe resolved.  New extensive patchy peripheral reticulation, groundglass opacity and consolidation throughout both lungs with mild to moderate traction bronchiectasis and volume loss.  A lower lobe predominance.  Evolving severe postinflammatory fibrosis/fibrotic NSIP pattern interstitial disease is favored.  Dilated main pulmonary artery suggesting pulmonary arterial hypertension.  Vessel coronary atherosclerosis.  Stable right adrenal adenoma. 03/27/2024 PFTs: FEV1 1.60 L or 71% predicted, FVC 1.98 L or 66% predicted, FEV1/FVC 81%, no bronchodilator response.  Lung volumes mildly reduced, diffusion capacity moderately reduced.  MIP/MEP's normal. 04/21/2024 overnight oximetry on CPAP: Showed only a transient desaturation to 81%.  No need for supplemental oxygen with CPAP.  Majority of the study patient had between 92 to 98% O2 sats.  Review of Systems A 10 point review of systems was performed and it is as noted above otherwise negative.   Patient Active Problem List   Diagnosis Date Noted   Combined hyperlipidemia associated with type 2 diabetes mellitus (HCC) 01/04/2024   Adhesive capsulitis of right shoulder 08/30/2023   History of colon polyps    Morbid obesity with body mass index (  BMI) of 40.0 or higher (HCC) 07/25/2020   Radiculopathy, lumbar region 11/05/2018   Major depressive disorder,  single episode, severe (HCC) 11/05/2018   GAD (generalized anxiety disorder) 11/04/2018   Essential hypertension, benign 11/04/2018   Varicose veins of lower extremity 10/21/2018   Chest pain 07/10/2018   Diverticulosis of colon 01/29/2018   Type 2 diabetes mellitus with hyperglycemia, without long-term current use of insulin  (HCC) 01/29/2018   Umbilical hernia 01/29/2018   Disorder of adrenal gland, unspecified (HCC) 01/29/2018   Diabetic peripheral neuropathy (HCC) 01/29/2018   Esophageal reflux 02/14/2016   Medicare annual wellness visit, subsequent 09/22/2015   Major depressive disorder, recurrent episode, in partial remission with anxious distress (HCC) 09/22/2015   Insomnia 09/22/2015   Need for immunization against influenza 09/22/2015   Benign hypertension with CKD (chronic kidney disease), stage II 07/29/2015   Adrenal mass (HCC) 07/28/2015   Allergic rhinitis with postnasal drip 07/28/2015   Anxiety and depression 07/28/2015   Osteoarthritis of multiple joints 07/28/2015   Alopecia 07/28/2015   Mixed hyperlipidemia 07/28/2015   Hypertension associated with diabetes (HCC) 07/28/2015   Obstructive sleep apnea of adult 07/28/2015   Borderline diabetes 07/28/2015   Avitaminosis D 07/28/2015   Adenomatous colon polyp 07/28/2015   Other fatigue 07/28/2015   H/O total knee replacement 10/08/2014   Arthritis of knee, degenerative 07/24/2014    Social History   Tobacco Use   Smoking status: Former    Current packs/day: 0.00    Types: Cigarettes    Quit date: 01/26/1992    Years since quitting: 32.3   Smokeless tobacco: Never   Tobacco comments:    Started smoking in the mid 80's    Smoked 0.25 PPD at the heaviest.      Substance Use Topics   Alcohol use: No    Alcohol/week: 0.0 standard drinks of alcohol    Allergies  Allergen Reactions   Acetaminophen      Pt denies   Codeine Nausea Only   Hydrocodone -Acetaminophen  Other (See Comments)    "Puts me up the wall"    Oxycodone Hcl    Oxycodone-Acetaminophen      Hallucination   Prednisolone     Hallucination   Statins     Current Meds  Medication Sig   albuterol  (VENTOLIN  HFA) 108 (90 Base) MCG/ACT inhaler Inhale 2 puffs into the lungs every 6 (six) hours as needed.   Bempedoic Acid  (NEXLETOL ) 180 MG TABS Take 1 tablet (180 mg total) by mouth daily.   fenofibrate  (TRICOR ) 145 MG tablet Take 1 tablet (145 mg total) by mouth daily.   ketoconazole  (NIZORAL ) 2 % shampoo Massage into scalp 3 times a week, let sit 5 minutes, then rinse.   losartan -hydrochlorothiazide  (HYZAAR) 100-12.5 MG tablet TAKE 1 TABLET BY MOUTH ONCE  DAILY   Multiple Vitamins-Minerals (MULTIVITAMIN WITH MINERALS) tablet Take 1 tablet by mouth daily.   ONETOUCH VERIO test strip 3 (three) times daily.   pantoprazole  (PROTONIX ) 40 MG tablet Take 1 tablet (40 mg total) by mouth daily.   pregabalin  (LYRICA ) 25 MG capsule Take 1 capsule (25 mg total) by mouth 2 (two) times daily.   sertraline  (ZOLOFT ) 25 MG tablet Take 1 tablet (25 mg total) by mouth 2 (two) times daily.   spironolactone  (ALDACTONE ) 25 MG tablet Take 0.5 tablets (12.5 mg total) by mouth daily.    Immunization History  Administered Date(s) Administered   Fluad Trivalent(High Dose 65+) 09/24/2023   Influenza Inj Mdck Quad Pf 09/29/2019   Influenza,inj,Quad PF,6+  Mos 09/22/2015   Moderna Sars-Covid-2 Vaccination 07/29/2020, 08/31/2020      Objective:     BP (!) 150/86 (BP Location: Left Arm, Patient Position: Sitting, Cuff Size: Normal)   Pulse 72   Temp 97.7 F (36.5 C) (Temporal)   Ht 5\' 4"  (1.626 m)   Wt 241 lb 12.8 oz (109.7 kg)   SpO2 94%   BMI 41.50 kg/m   SpO2: 94 %  GENERAL: Obese woman, no acute distress, no conversational dyspnea, fully ambulatory. HEAD: Normocephalic, atraumatic.  EYES: Pupils equal, round, reactive to light.  No scleral icterus.  MOUTH: Dentures both, no thrush.  Oral mucosa moist. NECK: Supple. No thyromegaly. Trachea  midline. No JVD.  No adenopathy. PULMONARY: Good air entry bilaterally.  No adventitious sounds. CARDIOVASCULAR: S1 and S2. Regular rate and rhythm.  Grade 2/6 holosystolic murmur present. ABDOMEN: Obese otherwise benign. MUSCULOSKELETAL: No joint deformity, no clubbing, no edema.  NEUROLOGIC: No overt focal deficit, no gait disturbance, speech is fluent.   SKIN: Intact,warm,dry. PSYCH: Mood and behavior normal.  Representative images from the CT performed 04 March 2024 independently reviewed:       Assessment & Plan:     ICD-10-CM   1. ILD (interstitial lung disease) (HCC)  J84.9 Rheumatoid Factor    Antinuclear Antib (ANA)    Sedimentation rate    C-reactive protein    Immunoglobulins, QN, A/E/G/M    CT Chest High Resolution   NSIP versus postinflammatory pulmonary fibrosis    2. Dyspnea on exertion  R06.09     3. OSA (obstructive sleep apnea)  G47.33     4. Morbid obesity (HCC)  E66.01       Orders Placed This Encounter  Procedures   CT Chest High Resolution    Standing Status:   Future    Expected Date:   08/14/2024    Expiration Date:   05/14/2025    Preferred imaging location?:   [facility]   Rheumatoid Factor    Standing Status:   Future    Expiration Date:   05/14/2025   Antinuclear Antib (ANA)    Standing Status:   Future    Expiration Date:   05/14/2025   Sedimentation rate    Standing Status:   Future    Expiration Date:   05/14/2025   C-reactive protein    Standing Status:   Future    Expiration Date:   05/14/2025   Immunoglobulins, QN, A/E/G/M    Standing Status:   Future    Expiration Date:   05/14/2025   Discussion    Post-inflammatory pulmonary fibrosis Post-inflammatory pulmonary fibrosis likely secondary to severe COVID-19 infections and past pneumonia. No suggestion of UIP on recent CT scan. - Order high-resolution CT scan before next visit to monitor lung scarring (6 months after most recent CT of March)  Sleep apnea Sleep apnea  managed with CPAP. Good compliance. No need for supplemental oxygen with CPAP. Zepbound discussed but patient does not meet criteria. The dose for sleep apnea differs from the dose for diabetes, and the reduced price applies only to the sleep apnea use.  Patient not interested.  Diabetes mellitus Diabetes management complicated by medication intolerance. Jardiance  discontinued due to side effects including dyspnea and hoarseness. Concerns about family history of diabetes-related complications and mortality, including two sisters and a brother with severe complications, and two sisters who passed away shortly after experiencing breathing difficulties.  Pneumonia Pneumonia resolved as of June 2024. No current issues.  Goals of  Care Discussion about medication side effects and family history of diabetes-related complications. Emphasis on monitoring and managing current conditions without exacerbating side effects. She is concerned about the impact of medications on her breathing and overall health, given her family's history of severe complications and mortality related to diabetes.  Follow-up Follow-up planned to monitor lung condition and immune function. - Order blood work to check immune globulins and overall immunity - Schedule follow-up appointment in 3-4 months       Advised if symptoms do not improve or worsen, to please contact office for sooner follow up or seek emergency care.    I spent 40 minutes of dedicated to the care of this patient on the date of this encounter to include pre-visit review of records, face-to-face time with the patient discussing conditions above, post visit ordering of testing, clinical documentation with the electronic health record, making appropriate referrals as documented, and communicating necessary findings to members of the patients care team.     C. Chloe Counter, MD [facility]    *This note was  generated using voice recognition software/Dragon and/or AI transcription program.  Despite best efforts to proofread, errors can occur which can change the meaning. Any transcriptional errors that result from this process are unintentional and may not be fully corrected at the time of dictation.

## 2024-05-20 NOTE — Telephone Encounter (Signed)
 Spoke to patient. She stated that she had an unpleasant experience with our office and she does not wish to schedule a follow up with Dr. Kieran Pellet.  She did not feel that her chart was reviewed by the provider, as MD was not aware that she is a diabetic. She stated that during the visit, it was mentioned multiple times that she needed to lose weight and start jardiance . Pt stated that she had taken Jardiance  previous and it caused SOB and she had family members that passed away from Jardiance .  She felt that she was not heard out by the provider.   She would like Dr. Viva Grise to follow her for OSA.  Dr. Viva Grise, please advise. Thanks

## 2024-05-20 NOTE — Telephone Encounter (Signed)
 I will follow her CPAP for now however if she develops problems with this I may need to refer her to sleep medicine in [place].  But for now we will continue to follow.

## 2024-05-20 NOTE — Telephone Encounter (Signed)
 Pt is aware of below message/recommendations and voiced her understanding.  Nothing further needed.

## 2024-05-22 ENCOUNTER — Other Ambulatory Visit
Admission: RE | Admit: 2024-05-22 | Discharge: 2024-05-22 | Disposition: A | Discharge status: Home / Self Care | Attending: Pulmonary Disease | Admitting: Pulmonary Disease

## 2024-05-22 ENCOUNTER — Encounter: Payer: Self-pay | Admitting: Internal Medicine

## 2024-05-22 ENCOUNTER — Ambulatory Visit: Payer: Self-pay | Admitting: Internal Medicine

## 2024-05-22 ENCOUNTER — Ambulatory Visit (INDEPENDENT_AMBULATORY_CARE_PROVIDER_SITE_OTHER): Admitting: Internal Medicine

## 2024-05-22 VITALS — BP 124/78 | HR 71 | Ht 64.0 in | Wt 242.4 lb

## 2024-05-22 DIAGNOSIS — E1165 Type 2 diabetes mellitus with hyperglycemia: Secondary | ICD-10-CM

## 2024-05-22 DIAGNOSIS — J849 Interstitial pulmonary disease, unspecified: Secondary | ICD-10-CM | POA: Insufficient documentation

## 2024-05-22 DIAGNOSIS — E1159 Type 2 diabetes mellitus with other circulatory complications: Secondary | ICD-10-CM

## 2024-05-22 DIAGNOSIS — E1169 Type 2 diabetes mellitus with other specified complication: Secondary | ICD-10-CM

## 2024-05-22 DIAGNOSIS — I152 Hypertension secondary to endocrine disorders: Secondary | ICD-10-CM | POA: Diagnosis not present

## 2024-05-22 DIAGNOSIS — E782 Mixed hyperlipidemia: Secondary | ICD-10-CM

## 2024-05-22 DIAGNOSIS — F411 Generalized anxiety disorder: Secondary | ICD-10-CM

## 2024-05-22 DIAGNOSIS — G4733 Obstructive sleep apnea (adult) (pediatric): Secondary | ICD-10-CM | POA: Diagnosis not present

## 2024-05-22 LAB — POCT CBG (FASTING - GLUCOSE)-MANUAL ENTRY: Glucose Fasting, POC: 141 mg/dL — AB (ref 70–99)

## 2024-05-22 LAB — C-REACTIVE PROTEIN: CRP: 1.3 mg/dL — ABNORMAL HIGH (ref <1.0)

## 2024-05-22 LAB — SEDIMENTATION RATE: Sed Rate: 25 mm/h (ref 0–30)

## 2024-05-22 MED ORDER — GLIPIZIDE 5 MG PO TABS: 2.5000 mg | ORAL_TABLET | Freq: Two times a day (BID) | ORAL | 3 refills | Status: DC

## 2024-05-22 MED ORDER — SERTRALINE HCL 50 MG PO TABS: 50.0000 mg | ORAL_TABLET | Freq: Every day | ORAL | 2 refills | Status: DC

## 2024-05-22 NOTE — Addendum Note (Signed)
 Addended by: Santoria Chason on: 05/22/2024 02:51 PM   Modules accepted: Orders

## 2024-05-22 NOTE — Progress Notes (Signed)
 Established Patient Office Visit  Subjective:  Patient ID: Taylor Barry, female    DOB: 08/13/1946  Age: 78 y.o. MRN: [id number]  Chief Complaint  Patient presents with   Follow-up    Patient comes in for follow-up accompanied by her daughter.  She is generally feeling better now and does not have any further's shortness of breath.  The pulmonologist has come to a conclusion that she does not suffer from interstitial lung disease but her shortness of breath was related to Jardiance  and pregabalin , which she has both stopped now and her breathing has improved significantly.  Patient is very happy with that.  However her blood sugars are high.  Her last hemoglobin A1c in April was 7.6, will be repeated in July.  Her creatinine runs above normal thus metformin was stopped.  Patient is agreeable to try glipizide  at 2.5 mg twice a day. Her PHQ-9/GAD score is high due to recent demise of close relatives.  She is currently on Zoloft  25 mg/day, will increase to 50 mg/day.    No other concerns at this time.   Past Medical History:  Diagnosis Date   Allergic rhinitis with postnasal drip    Anxiety and depression    Arthritis    Arthritis, multiple joint involvement    Bronchitis, acute, with bronchospasm    Hair loss    HTN, goal below 150/90    Hyperlipidemia LDL goal <100    Obstructive sleep apnea, adult    CPAP   Right adrenal mass (HCC)    Vitamin D deficiency     Past Surgical History:  Procedure Laterality Date   ABDOMINAL HYSTERECTOMY  1968   total   COLONOSCOPY WITH PROPOFOL  N/A 04/15/2021   Procedure: COLONOSCOPY WITH PROPOFOL ;  Surgeon: Marnee Sink, MD;  Location: [facility];  Service: Endoscopy;  Laterality: N/A;  [facility] - oral meds priority 4   REPLACEMENT TOTAL KNEE Bilateral    Right knee first, Left knee more recent   TYMPANOPLASTY Right 1977    Social History   Socioeconomic History   Marital status: Divorced    Spouse name: Not on file   Number  of children: Not on file   Years of education: Not on file   Highest education level: Not on file  Occupational History   Not on file  Tobacco Use   Smoking status: Former    Current packs/day: 0.00    Types: Cigarettes    Quit date: 01/26/1992    Years since quitting: 32.3   Smokeless tobacco: Never   Tobacco comments:    Started smoking in the mid 80's    Smoked 0.25 PPD at the heaviest.      Vaping Use   Vaping status: Never Used  Substance and Sexual Activity   Alcohol use: No    Alcohol/week: 0.0 standard drinks of alcohol   Drug use: No   Sexual activity: Never  Other Topics Concern   Not on file  Social History Narrative   Not on file   Social Drivers of Health   Financial Resource Strain: Low Risk  (01/01/2024)   Received from [facility]   Overall Financial Resource Strain (CARDIA)    Difficulty of Paying Living Expenses: Not hard at all  Food Insecurity: No Food Insecurity (01/01/2024)   Received from [facility]   Hunger Vital Sign    Worried About Running Out of Food in the Last Year: Never true  Ran Out of Food in the Last Year: Never true  Transportation Needs: No Transportation Needs (01/01/2024)   Received from [facility] - Transportation    In the past 12 months, has lack of transportation kept you from medical appointments or from getting medications?: No    Lack of Transportation (Non-Medical): No  Physical Activity: Not on file  Stress: Not on file  Social Connections: Unknown (05/09/2022)   Received from [facility]   Social Network    Social Network: Not on file  Intimate Partner Violence: Unknown (03/31/2022)   Received from [facility]   HITS    Physically Hurt: Not on file    Insult or Talk Down To: Not on file    Threaten Physical Harm: Not on file    Scream or Curse: Not on file    Family History  Problem Relation Age of Onset   Heart  disease Mother    Alcohol abuse Father    Diabetes Daughter    Breast cancer Maternal Aunt    Cancer Brother    Cancer Maternal Grandmother    Cancer Maternal Grandfather     Allergies  Allergen Reactions   Acetaminophen      Pt denies   Codeine Nausea Only   Hydrocodone -Acetaminophen  Other (See Comments)    "Puts me up the wall"   Oxycodone Hcl    Oxycodone-Acetaminophen      Hallucination   Prednisolone     Hallucination   Statins     Outpatient Medications Prior to Visit  Medication Sig   albuterol  (VENTOLIN  HFA) 108 (90 Base) MCG/ACT inhaler Inhale 2 puffs into the lungs every 6 (six) hours as needed.   Bempedoic Acid  (NEXLETOL ) 180 MG TABS Take 1 tablet (180 mg total) by mouth daily.   fenofibrate  (TRICOR ) 145 MG tablet Take 1 tablet (145 mg total) by mouth daily.   losartan -hydrochlorothiazide  (HYZAAR) 100-12.5 MG tablet TAKE 1 TABLET BY MOUTH ONCE  DAILY   ONETOUCH VERIO test strip 3 (three) times daily.   pantoprazole  (PROTONIX ) 40 MG tablet Take 1 tablet (40 mg total) by mouth daily.   [DISCONTINUED] sertraline  (ZOLOFT ) 25 MG tablet Take 1 tablet (25 mg total) by mouth 2 (two) times daily.   aspirin 81 MG chewable tablet Chew by mouth. (Patient not taking: Reported on 05/22/2024)   ketoconazole  (NIZORAL ) 2 % shampoo Massage into scalp 3 times a week, let sit 5 minutes, then rinse. (Patient not taking: Reported on 05/22/2024)   Multiple Vitamins-Minerals (MULTIVITAMIN WITH MINERALS) tablet Take 1 tablet by mouth daily. (Patient not taking: Reported on 05/22/2024)   spironolactone  (ALDACTONE ) 25 MG tablet Take 0.5 tablets (12.5 mg total) by mouth daily. (Patient not taking: Reported on 05/22/2024)   [DISCONTINUED] pregabalin  (LYRICA ) 25 MG capsule Take 1 capsule (25 mg total) by mouth 2 (two) times daily. (Patient not taking: Reported on 05/22/2024)   No facility-administered medications prior to visit.    Review of Systems  Constitutional:  Positive for malaise/fatigue.  Negative for chills, fever and weight loss.  HENT: Negative.  Negative for sore throat.   Eyes: Negative.   Respiratory: Negative.  Negative for cough and shortness of breath.   Cardiovascular: Negative.  Negative for chest pain, palpitations and leg swelling.  Gastrointestinal: Negative.  Negative for abdominal pain, constipation, diarrhea, heartburn, nausea and vomiting.  Genitourinary: Negative.  Negative for dysuria and flank pain.  Musculoskeletal: Negative.  Negative for joint pain and myalgias.  Skin: Negative.   Neurological: Negative.  Negative for dizziness, tingling, tremors and headaches.  Endo/Heme/Allergies: Negative.   Psychiatric/Behavioral: Negative.  Negative for depression and suicidal ideas. The patient is not nervous/anxious.        Objective:   BP 124/78   Pulse 71   Ht 5\' 4"  (1.626 m)   Wt 242 lb 6.4 oz (110 kg)   SpO2 95%   BMI 41.61 kg/m   Vitals:   05/22/24 1345  BP: 124/78  Pulse: 71  Height: 5\' 4"  (1.626 m)  Weight: 242 lb 6.4 oz (110 kg)  SpO2: 95%  BMI (Calculated): 41.59    Physical Exam Vitals and nursing note reviewed.  Constitutional:      Appearance: Normal appearance.  HENT:     Head: Normocephalic and atraumatic.     Nose: Nose normal.     Mouth/Throat:     Mouth: Mucous membranes are moist.     Pharynx: Oropharynx is clear.  Eyes:     Conjunctiva/sclera: Conjunctivae normal.     Pupils: Pupils are equal, round, and reactive to light.  Cardiovascular:     Rate and Rhythm: Normal rate and regular rhythm.     Pulses: Normal pulses.     Heart sounds: Normal heart sounds. No murmur heard. Pulmonary:     Effort: Pulmonary effort is normal.     Breath sounds: Normal breath sounds. No wheezing.  Abdominal:     General: Bowel sounds are normal.     Palpations: Abdomen is soft.     Tenderness: There is no abdominal tenderness. There is no right CVA tenderness or left CVA tenderness.  Musculoskeletal:        General: Normal range  of motion.     Cervical back: Normal range of motion.     Right lower leg: No edema.     Left lower leg: No edema.  Skin:    General: Skin is warm and dry.  Neurological:     General: No focal deficit present.     Mental Status: She is alert and oriented to person, place, and time.  Psychiatric:        Mood and Affect: Mood normal.        Behavior: Behavior normal.      No results found for any visits on 05/22/24.  Recent Results (from the past 2160 hours)  Nitric oxide      Status: None   Collection Time: 02/26/24  2:11 PM  Result Value Ref Range   Nitric Oxide  23   Pulmonary Function Test [facility] Only     Status: None   Collection Time: 03/27/24  2:46 PM  Result Value Ref Range   FVC-Pre 1.98 L   FVC-%Pred-Pre 66 %   FVC-Post 2.05 L   FVC-%Pred-Post 68 %   FVC-%Change-Post 3 %   FEV1-Pre 1.60 L   FEV1-%Pred-Pre 71 %   FEV1-Post 1.68 L   FEV1-%Pred-Post 75 %   FEV1-%Change-Post 4 %   FEV6-Pre 1.98 L   FEV6-%Pred-Pre 69 %   FEV6-Post 2.05 L   FEV6-%Pred-Post 72 %   FEV6-%Change-Post 3 %   Pre FEV1/FVC ratio 81 %   FEV1FVC-%Pred-Pre 108 %   Post FEV1/FVC ratio 82 %   FEV1FVC-%Change-Post 1 %   Pre FEV6/FVC Ratio 100 %   FEV6FVC-%Pred-Pre 105 %   Post FEV6/FVC ratio 100 %   FEV6FVC-%Pred-Post 105 %   FEF 25-75 Pre 1.58 L/sec   FEF2575-%Pred-Pre 94 %   FEF 25-75  Post 2.03 L/sec   FEF2575-%Pred-Post 121 %   FEF2575-%Change-Post 28 %   RV 2.07 L   RV % pred 85 %   TLC 4.04 L   TLC % pred 75 %   DLCO unc 8.39 ml/min/mmHg   DLCO unc % pred 41 %   DL/VA 0.45 ml/min/mmHg/L   DL/VA % pred 65 %  CBC with Diff     Status: None   Collection Time: 04/03/24  8:40 AM  Result Value Ref Range   WBC 5.8 3.4 - 10.8 x10E3/uL   RBC 4.16 3.77 - 5.28 x10E6/uL   Hemoglobin 12.3 11.1 - 15.9 g/dL   Hematocrit 40.9 81.1 - 46.6 %   MCV 93 79 - 97 fL   MCH 29.6 26.6 - 33.0 pg   MCHC 31.7 31.5 - 35.7 g/dL   RDW 91.4 78.2 - 95.6 %   Platelets 314 150 - 450 x10E3/uL   Neutrophils  51 Not Estab. %   Lymphs 37 Not Estab. %   Monocytes 8 Not Estab. %   Eos 3 Not Estab. %   Basos 1 Not Estab. %   Neutrophils Absolute 2.9 1.4 - 7.0 x10E3/uL   Lymphocytes Absolute 2.1 0.7 - 3.1 x10E3/uL   Monocytes Absolute 0.5 0.1 - 0.9 x10E3/uL   EOS (ABSOLUTE) 0.2 0.0 - 0.4 x10E3/uL   Basophils Absolute 0.1 0.0 - 0.2 x10E3/uL   Immature Granulocytes 0 Not Estab. %   Immature Grans (Abs) 0.0 0.0 - 0.1 x10E3/uL  CMP14+EGFR     Status: Abnormal   Collection Time: 04/03/24  8:40 AM  Result Value Ref Range   Glucose 167 (H) 70 - 99 mg/dL   BUN 20 8 - 27 mg/dL   Creatinine, Ser 2.13 (H) 0.57 - 1.00 mg/dL   eGFR 52 (L) >08 MV/HQI/6.96   BUN/Creatinine Ratio 18 12 - 28   Sodium 136 134 - 144 mmol/L   Potassium 4.7 3.5 - 5.2 mmol/L   Chloride 101 96 - 106 mmol/L   CO2 20 20 - 29 mmol/L   Calcium 10.2 8.7 - 10.3 mg/dL   Total Protein 7.5 6.0 - 8.5 g/dL   Albumin 4.3 3.8 - 4.8 g/dL   Globulin, Total 3.2 1.5 - 4.5 g/dL   Bilirubin Total 0.3 0.0 - 1.2 mg/dL   Alkaline Phosphatase 83 44 - 121 IU/L   AST 28 0 - 40 IU/L   ALT 18 0 - 32 IU/L  Lipid Panel w/o Chol/HDL Ratio     Status: Abnormal   Collection Time: 04/03/24  8:40 AM  Result Value Ref Range   Cholesterol, Total 173 100 - 199 mg/dL   Triglycerides 295 (H) 0 - 149 mg/dL   HDL 12 (L) >28 mg/dL   VLDL Cholesterol Cal 68 (H) 5 - 40 mg/dL   LDL Chol Calc (NIH) 93 0 - 99 mg/dL  Hemoglobin U1L     Status: Abnormal   Collection Time: 04/03/24  8:40 AM  Result Value Ref Range   Hgb A1c MFr Bld 7.6 (H) 4.8 - 5.6 %    Comment:          Prediabetes: 5.7 - 6.4          Diabetes: >6.4          Glycemic control for adults with diabetes: <7.0    Est. average glucose Bld gHb Est-mCnc 171 mg/dL  POCT CBG (Fasting - Glucose)     Status: Abnormal   Collection Time: 04/03/24  9:05  AM  Result Value Ref Range   Glucose Fasting, POC 156 (A) 70 - 99 mg/dL      Assessment & Plan:  Continue current medications.  Add glipizide  2.5 mg  twice a day, monitor blood sugars at home.  Increase Zoloft  to 50 mg/day.   Patient will get fasting blood work before next visit. Problem List Items Addressed This Visit     Hypertension associated with diabetes (HCC) - Primary   Relevant Medications   glipiZIDE  (GLUCOTROL ) 5 MG tablet   Obstructive sleep apnea of adult   Type 2 diabetes mellitus with hyperglycemia, without long-term current use of insulin  (HCC)   Relevant Medications   glipiZIDE  (GLUCOTROL ) 5 MG tablet   GAD (generalized anxiety disorder)   Relevant Medications   sertraline  (ZOLOFT ) 50 MG tablet   Combined hyperlipidemia associated with type 2 diabetes mellitus (HCC)   Relevant Medications   glipiZIDE  (GLUCOTROL ) 5 MG tablet    Return in about 6 weeks (around 07/03/2024).   Total time spent: 30 minutes  Aisha Hove, MD  05/22/2024   This document may have been prepared by [facility] Voice Recognition software and as such may include unintentional dictation errors.

## 2024-05-23 LAB — ANA: Anti Nuclear Antibody (ANA): POSITIVE — AB

## 2024-05-24 LAB — IMMUNOGLOBULINS A/E/G/M, SERUM
IgA: 172 mg/dL (ref 64–422)
IgE (Immunoglobulin E), Serum: <2 [IU]/mL — ABNORMAL LOW (ref 6–495)
IgG (Immunoglobin G), Serum: 1197 mg/dL (ref 586–1602)
IgM (Immunoglobulin M), Srm: 171 mg/dL (ref 26–217)

## 2024-05-24 LAB — RHEUMATOID FACTOR: Rheumatoid fact SerPl-aCnc: 16.4 [IU]/mL — ABNORMAL HIGH (ref <14.0)

## 2024-06-11 ENCOUNTER — Ambulatory Visit
Admission: RE | Admit: 2024-06-11 | Discharge: 2024-06-11 | Disposition: A | Discharge status: Home / Self Care | Source: Ambulatory Visit | Attending: Internal Medicine | Admitting: Internal Medicine

## 2024-06-11 DIAGNOSIS — Z1231 Encounter for screening mammogram for malignant neoplasm of breast: Secondary | ICD-10-CM | POA: Diagnosis not present

## 2024-06-13 ENCOUNTER — Ambulatory Visit: Payer: Self-pay | Admitting: Internal Medicine

## 2024-06-13 ENCOUNTER — Other Ambulatory Visit: Payer: Self-pay | Admitting: Internal Medicine

## 2024-06-13 DIAGNOSIS — I10 Essential (primary) hypertension: Secondary | ICD-10-CM

## 2024-06-13 NOTE — Progress Notes (Signed)
 Patient notified

## 2024-07-07 ENCOUNTER — Other Ambulatory Visit

## 2024-07-07 ENCOUNTER — Ambulatory Visit: Admitting: Internal Medicine

## 2024-07-07 DIAGNOSIS — K219 Gastro-esophageal reflux disease without esophagitis: Secondary | ICD-10-CM | POA: Diagnosis not present

## 2024-07-07 DIAGNOSIS — E1159 Type 2 diabetes mellitus with other circulatory complications: Secondary | ICD-10-CM | POA: Diagnosis not present

## 2024-07-07 DIAGNOSIS — E782 Mixed hyperlipidemia: Secondary | ICD-10-CM | POA: Diagnosis not present

## 2024-07-07 DIAGNOSIS — E1165 Type 2 diabetes mellitus with hyperglycemia: Secondary | ICD-10-CM | POA: Diagnosis not present

## 2024-07-07 DIAGNOSIS — E1169 Type 2 diabetes mellitus with other specified complication: Secondary | ICD-10-CM | POA: Diagnosis not present

## 2024-07-07 DIAGNOSIS — I152 Hypertension secondary to endocrine disorders: Secondary | ICD-10-CM | POA: Diagnosis not present

## 2024-07-08 ENCOUNTER — Other Ambulatory Visit

## 2024-07-08 ENCOUNTER — Ambulatory Visit: Payer: Self-pay | Admitting: Internal Medicine

## 2024-07-08 DIAGNOSIS — E875 Hyperkalemia: Secondary | ICD-10-CM | POA: Diagnosis not present

## 2024-07-08 LAB — LIPID PANEL W/O CHOL/HDL RATIO
Cholesterol, Total: 211 mg/dL — ABNORMAL HIGH (ref 100–199)
HDL: 19 mg/dL — ABNORMAL LOW (ref >39)
LDL Chol Calc (NIH): 130 mg/dL — ABNORMAL HIGH (ref 0–99)
Triglycerides: 344 mg/dL — ABNORMAL HIGH (ref 0–149)
VLDL Cholesterol Cal: 62 mg/dL — ABNORMAL HIGH (ref 5–40)

## 2024-07-08 LAB — CMP14+EGFR
ALT: 15 IU/L (ref 0–32)
AST: 23 IU/L (ref 0–40)
Albumin: 4.6 g/dL (ref 3.8–4.8)
Alkaline Phosphatase: 68 IU/L (ref 44–121)
BUN/Creatinine Ratio: 20 (ref 12–28)
BUN: 23 mg/dL (ref 8–27)
Bilirubin Total: 0.3 mg/dL (ref 0.0–1.2)
CO2: 18 mmol/L — ABNORMAL LOW (ref 20–29)
Calcium: 10.6 mg/dL — ABNORMAL HIGH (ref 8.7–10.3)
Chloride: 103 mmol/L (ref 96–106)
Creatinine, Ser: 1.14 mg/dL — ABNORMAL HIGH (ref 0.57–1.00)
Globulin, Total: 2.9 g/dL (ref 1.5–4.5)
Glucose: 131 mg/dL — ABNORMAL HIGH (ref 70–99)
Potassium: 5.5 mmol/L — ABNORMAL HIGH (ref 3.5–5.2)
Sodium: 139 mmol/L (ref 134–144)
Total Protein: 7.5 g/dL (ref 6.0–8.5)
eGFR: 50 mL/min/1.73 — ABNORMAL LOW (ref >59)

## 2024-07-08 LAB — CBC WITH DIFFERENTIAL/PLATELET
Basophils Absolute: 0.1 x10E3/uL (ref 0.0–0.2)
Basos: 1 %
EOS (ABSOLUTE): 0.2 x10E3/uL (ref 0.0–0.4)
Eos: 4 %
Hematocrit: 40.5 % (ref 34.0–46.6)
Hemoglobin: 12.9 g/dL (ref 11.1–15.9)
Immature Grans (Abs): 0 x10E3/uL (ref 0.0–0.1)
Immature Granulocytes: 0 %
Lymphocytes Absolute: 2.1 x10E3/uL (ref 0.7–3.1)
Lymphs: 36 %
MCH: 31.2 pg (ref 26.6–33.0)
MCHC: 31.9 g/dL (ref 31.5–35.7)
MCV: 98 fL — ABNORMAL HIGH (ref 79–97)
Monocytes Absolute: 0.4 x10E3/uL (ref 0.1–0.9)
Monocytes: 7 %
Neutrophils Absolute: 3 x10E3/uL (ref 1.4–7.0)
Neutrophils: 52 %
Platelets: 268 x10E3/uL (ref 150–450)
RBC: 4.14 x10E6/uL (ref 3.77–5.28)
RDW: 13.1 % (ref 11.7–15.4)
WBC: 5.7 x10E3/uL (ref 3.4–10.8)

## 2024-07-08 LAB — HEMOGLOBIN A1C
Est. average glucose Bld gHb Est-mCnc: 163 mg/dL
Hgb A1c MFr Bld: 7.3 % — ABNORMAL HIGH (ref 4.8–5.6)

## 2024-07-09 LAB — POTASSIUM: Potassium: 4.7 mmol/L (ref 3.5–5.2)

## 2024-07-11 ENCOUNTER — Telehealth: Payer: Self-pay

## 2024-07-11 ENCOUNTER — Other Ambulatory Visit: Payer: Self-pay | Admitting: Internal Medicine

## 2024-07-11 ENCOUNTER — Ambulatory Visit: Payer: Self-pay | Admitting: Internal Medicine

## 2024-07-11 ENCOUNTER — Ambulatory Visit: Admitting: Internal Medicine

## 2024-07-11 ENCOUNTER — Encounter: Payer: Self-pay | Admitting: Internal Medicine

## 2024-07-11 VITALS — BP 124/82 | HR 73 | Ht 64.0 in | Wt 241.2 lb

## 2024-07-11 DIAGNOSIS — K219 Gastro-esophageal reflux disease without esophagitis: Secondary | ICD-10-CM

## 2024-07-11 DIAGNOSIS — L304 Erythema intertrigo: Secondary | ICD-10-CM | POA: Diagnosis not present

## 2024-07-11 DIAGNOSIS — G4733 Obstructive sleep apnea (adult) (pediatric): Secondary | ICD-10-CM | POA: Diagnosis not present

## 2024-07-11 DIAGNOSIS — E1169 Type 2 diabetes mellitus with other specified complication: Secondary | ICD-10-CM

## 2024-07-11 DIAGNOSIS — E782 Mixed hyperlipidemia: Secondary | ICD-10-CM | POA: Diagnosis not present

## 2024-07-11 DIAGNOSIS — E1159 Type 2 diabetes mellitus with other circulatory complications: Secondary | ICD-10-CM | POA: Diagnosis not present

## 2024-07-11 DIAGNOSIS — E1165 Type 2 diabetes mellitus with hyperglycemia: Secondary | ICD-10-CM | POA: Diagnosis not present

## 2024-07-11 DIAGNOSIS — I152 Hypertension secondary to endocrine disorders: Secondary | ICD-10-CM

## 2024-07-11 LAB — POC CREATINE & ALBUMIN,URINE
Albumin/Creatinine Ratio, Urine, POC: <30
Creatinine, POC: 200 mg/dL
Microalbumin Ur, POC: 30 mg/L

## 2024-07-11 MED ORDER — CLOTRIMAZOLE-BETAMETHASONE 1-0.05 % EX CREA: 1.0000 | TOPICAL_CREAM | Freq: Two times a day (BID) | CUTANEOUS | 2 refills | Status: DC

## 2024-07-11 MED ORDER — ONETOUCH VERIO VI STRP: 1.0000 | ORAL_STRIP | Freq: Three times a day (TID) | 6 refills | Status: DC

## 2024-07-11 MED ORDER — NYSTATIN 100000 UNIT/GM EX POWD: 1.0000 | Freq: Three times a day (TID) | CUTANEOUS | 3 refills | Status: DC

## 2024-07-11 MED ORDER — NEXLIZET 180-10 MG PO TABS: 1.0000 | ORAL_TABLET | Freq: Every day | ORAL | 3 refills | Status: AC

## 2024-07-11 NOTE — Progress Notes (Addendum)
   07/11/2024  Patient ID: Ronal DELENA Polite, female   DOB: 1946-05-16, 78 y.o.   MRN: [id number]  Received request to enroll patient in [facility] grant for [employer]. Patient has been approved through 06/10/25.  Patient will receive a card in the mail. Billing info placed below for reference.      Spoke with [employer] and provided the billing information.  Jon VEAR Lindau, PharmD Clinical Pharmacist [phone]

## 2024-07-11 NOTE — Progress Notes (Signed)
 Established Patient Office Visit  Subjective:  Patient ID: Taylor Barry, female    DOB: [birth year]  Age: 78 y.o. MRN: [id number]  Chief Complaint  Patient presents with   Follow-up    2 month follow up    Patient comes in for follow-up today accompanied by her daughter.  She had labs done and the results were discussed today.  Her hemoglobin A1c is 7.3 now.  Patient is only taking glipizide  2.5 mg twice a day.  When it was increased to 5 mg she had low blood sugar readings.  Her fasting glucose is around 130.  Patient will try taking 2.5 mg 3 times a day and see if it is tolerated.  Mentions still having occasional shortness of breath-Jardiance  was stopped because it was considered its side effect.  Patient is under care of pulmonologist for a repeat CT scan and a follow-up to monitor for interstitial lung disease.  Denies chest pain or palpitations.  No nausea vomiting or diarrhea. Mentions pleuritic rash in her groin, will send in Lotrisone cream and nystatin powder for intertrigo.    No other concerns at this time.   Past Medical History:  Diagnosis Date   Allergic rhinitis with postnasal drip    Anxiety and depression    Arthritis    Arthritis, multiple joint involvement    Bronchitis, acute, with bronchospasm    Hair loss    HTN, goal below 150/90    Hyperlipidemia LDL goal <100    Obstructive sleep apnea, adult    CPAP   Right adrenal mass (HCC)    Vitamin D deficiency     Past Surgical History:  Procedure Laterality Date   ABDOMINAL HYSTERECTOMY  1968   total   COLONOSCOPY WITH PROPOFOL  N/A 04/15/2021   Procedure: COLONOSCOPY WITH PROPOFOL ;  Surgeon: Jinny Carmine, MD;  Location: [facility];  Service: Endoscopy;  Laterality: N/A;  Daibetic - oral meds priority 4   REPLACEMENT TOTAL KNEE Bilateral    Right knee first, Left knee more recent   TYMPANOPLASTY Right 1977    Social History   Socioeconomic History   Marital status: Divorced    Spouse name:  Not on file   Number of children: Not on file   Years of education: Not on file   Highest education level: Not on file  Occupational History   Not on file  Tobacco Use   Smoking status: Former    Current packs/day: 0.00    Types: Cigarettes    Quit date: 01/26/1992    Years since quitting: 32.4   Smokeless tobacco: Never   Tobacco comments:    Started smoking in the mid 80's    Smoked 0.25 PPD at the heaviest.      Vaping Use   Vaping status: Never Used  Substance and Sexual Activity   Alcohol use: No    Alcohol/week: 0.0 standard drinks of alcohol   Drug use: No   Sexual activity: Never  Other Topics Concern   Not on file  Social History Narrative   Not on file   Social Drivers of Health   Financial Resource Strain: Low Risk  (01/01/2024)   Received from [facility]   Overall Financial Resource Strain (CARDIA)    Difficulty of Paying Living Expenses: Not hard at all  Food Insecurity: No Food Insecurity (01/01/2024)   Received from [facility]   Hunger Vital Sign    Within the past 12 months, you  worried that your food would run out before you got the money to buy more.: Never true    Within the past 12 months, the food you bought just didn't last and you didn't have money to get more.: Never true  Transportation Needs: No Transportation Needs (01/01/2024)   Received from [facility]    In the past 12 months, has lack of transportation kept you from medical appointments or from getting medications?: No    Lack of Transportation (Non-Medical): No  Physical Activity: Not on file  Stress: Not on file  Social Connections: Unknown (05/09/2022)   Received from [facility]   Social Network    Social Network: Not on file  Intimate Partner Violence: Unknown (03/31/2022)   Received from [facility]   HITS    Physically Hurt: Not on file    Insult or Talk Down To: Not on file    Threaten Physical  Harm: Not on file    Scream or Curse: Not on file    Family History  Problem Relation Age of Onset   Heart disease Mother    Alcohol abuse Father    Diabetes Daughter    Breast cancer Maternal Aunt    Cancer Brother    Cancer Maternal Grandmother    Cancer Maternal Grandfather     Allergies  Allergen Reactions   Acetaminophen      Pt denies   Codeine Nausea Only   Hydrocodone -Acetaminophen  Other (See Comments)    Puts me up the wall   Oxycodone Hcl    Oxycodone-Acetaminophen      Hallucination   Prednisolone     Hallucination   Statins     Outpatient Medications Prior to Visit  Medication Sig   albuterol  (VENTOLIN  HFA) 108 (90 Base) MCG/ACT inhaler Inhale 2 puffs into the lungs every 6 (six) hours as needed.   Bempedoic Acid  (NEXLETOL ) 180 MG TABS Take 1 tablet (180 mg total) by mouth daily.   fenofibrate  (TRICOR ) 145 MG tablet Take 1 tablet (145 mg total) by mouth daily.   glipiZIDE  (GLUCOTROL ) 5 MG tablet Take 0.5 tablets (2.5 mg total) by mouth 2 (two) times daily before a meal.   losartan -hydrochlorothiazide  (HYZAAR) 100-12.5 MG tablet TAKE 1 TABLET BY MOUTH ONCE  DAILY   pantoprazole  (PROTONIX ) 40 MG tablet Take 1 tablet (40 mg total) by mouth daily.   sertraline  (ZOLOFT ) 50 MG tablet Take 1 tablet (50 mg total) by mouth daily. (Patient taking differently: Take 25 mg by mouth daily.)   [DISCONTINUED] ONETOUCH VERIO test strip 3 (three) times daily.   aspirin 81 MG chewable tablet Chew by mouth. (Patient not taking: Reported on 07/11/2024)   ketoconazole  (NIZORAL ) 2 % shampoo Massage into scalp 3 times a week, let sit 5 minutes, then rinse. (Patient not taking: Reported on 07/11/2024)   Multiple Vitamins-Minerals (MULTIVITAMIN WITH MINERALS) tablet Take 1 tablet by mouth daily. (Patient not taking: Reported on 07/11/2024)   spironolactone  (ALDACTONE ) 25 MG tablet Take 0.5 tablets (12.5 mg total) by mouth daily. (Patient not taking: Reported on 07/11/2024)   No  facility-administered medications prior to visit.    Review of Systems  Constitutional: Negative.  Negative for chills, fever, malaise/fatigue and weight loss.  HENT: Negative.  Negative for sore throat.   Eyes: Negative.   Respiratory: Negative.  Negative for cough and shortness of breath.   Cardiovascular: Negative.  Negative for chest pain, palpitations and leg swelling.  Gastrointestinal: Negative.  Negative for  abdominal pain, constipation, diarrhea, heartburn, nausea and vomiting.  Genitourinary: Negative.  Negative for dysuria and flank pain.  Musculoskeletal: Negative.  Negative for joint pain and myalgias.  Skin:  Positive for rash (groin).  Neurological: Negative.  Negative for dizziness, tingling, tremors and headaches.  Endo/Heme/Allergies: Negative.   Psychiatric/Behavioral: Negative.  Negative for depression and suicidal ideas. The patient is not nervous/anxious.        Objective:   BP 124/82   Pulse 73   Ht 5' 4 (1.626 m)   Wt 241 lb 3.2 oz (109.4 kg)   SpO2 96%   BMI 41.40 kg/m   Vitals:   07/11/24 1147  BP: 124/82  Pulse: 73  Height: 5' 4 (1.626 m)  Weight: 241 lb 3.2 oz (109.4 kg)  SpO2: 96%  BMI (Calculated): 41.38    Physical Exam Vitals and nursing note reviewed.  Constitutional:      Appearance: Normal appearance.  HENT:     Head: Normocephalic and atraumatic.     Nose: Nose normal.     Mouth/Throat:     Mouth: Mucous membranes are moist.     Pharynx: Oropharynx is clear.  Eyes:     Conjunctiva/sclera: Conjunctivae normal.     Pupils: Pupils are equal, round, and reactive to light.  Cardiovascular:     Rate and Rhythm: Normal rate and regular rhythm.     Pulses: Normal pulses.     Heart sounds: Normal heart sounds. No murmur heard. Pulmonary:     Effort: Pulmonary effort is normal.     Breath sounds: Normal breath sounds. No wheezing.  Abdominal:     General: Bowel sounds are normal.     Palpations: Abdomen is soft.      Tenderness: There is no abdominal tenderness. There is no right CVA tenderness or left CVA tenderness.  Musculoskeletal:        General: Normal range of motion.     Cervical back: Normal range of motion.     Right lower leg: No edema.     Left lower leg: No edema.  Skin:    General: Skin is warm and dry.  Neurological:     General: No focal deficit present.     Mental Status: She is alert and oriented to person, place, and time.  Psychiatric:        Mood and Affect: Mood normal.        Behavior: Behavior normal.      Results for orders placed or performed in visit on 07/11/24  POC CREATINE & ALBUMIN,URINE  Result Value Ref Range   Microalbumin Ur, POC 30 mg/L   Creatinine, POC 200 mg/dL   Albumin/Creatinine Ratio, Urine, POC <30     Recent Results (from the past 2160 hours)  Immunoglobulins, QN, A/E/G/M     Status: Abnormal   Collection Time: 05/22/24  1:17 PM  Result Value Ref Range   IgG (Immunoglobin G), Serum 1,197 586 - 1,602 mg/dL   IgA 827 64 - 577 mg/dL   IgM (Immunoglobulin M), Srm 171 26 - 217 mg/dL   IgE (Immunoglobulin E), Serum <2 (L) 6 - 495 IU/mL    Comment: (NOTE) Performed At: [facility] [address] [place], [place] [id number] Jennette Shorter MD Ey:[id number]   C-reactive protein     Status: Abnormal   Collection Time: 05/22/24  1:17 PM  Result Value Ref Range   CRP 1.3 (H) <1.0 mg/dL    Comment: Performed at [facility],  [address] [place], [place] [address]  Sedimentation rate     Status: None   Collection Time: 05/22/24  1:17 PM  Result Value Ref Range   Sed Rate 25 0 - 30 mm/hr    Comment: Performed at [facility], [address] [place], [place] [address]  Antinuclear Antib (ANA)     Status: Abnormal   Collection Time: 05/22/24  1:17 PM  Result Value Ref Range   Anti Nuclear Antibody (ANA) Positive (A) Negative    Comment: (NOTE) Performed At: [facility] [address] [place], [place]  [id number] Jennette Shorter MD Ey:[id number]   Rheumatoid Factor     Status: Abnormal   Collection Time: 05/22/24  1:17 PM  Result Value Ref Range   Rheumatoid fact SerPl-aCnc 16.4 (H) <14.0 IU/mL    Comment: (NOTE) Performed At: [facility] [address] [place], [place] [id number] Jennette Shorter MD Ey:[id number]   POCT CBG (Fasting - Glucose)     Status: Abnormal   Collection Time: 05/22/24  2:50 PM  Result Value Ref Range   Glucose Fasting, POC 141 (A) 70 - 99 mg/dL  CBC with Diff     Status: Abnormal   Collection Time: 07/07/24  9:12 AM  Result Value Ref Range   WBC 5.7 3.4 - 10.8 x10E3/uL   RBC 4.14 3.77 - 5.28 x10E6/uL   Hemoglobin 12.9 11.1 - 15.9 g/dL   Hematocrit 59.4 65.9 - 46.6 %   MCV 98 (H) 79 - 97 fL   MCH 31.2 26.6 - 33.0 pg   MCHC 31.9 31.5 - 35.7 g/dL   RDW 86.8 88.2 - 84.5 %   Platelets 268 150 - 450 x10E3/uL   Neutrophils 52 Not Estab. %   Lymphs 36 Not Estab. %   Monocytes 7 Not Estab. %   Eos 4 Not Estab. %   Basos 1 Not Estab. %   Neutrophils Absolute 3.0 1.4 - 7.0 x10E3/uL   Lymphocytes Absolute 2.1 0.7 - 3.1 x10E3/uL   Monocytes Absolute 0.4 0.1 - 0.9 x10E3/uL   EOS (ABSOLUTE) 0.2 0.0 - 0.4 x10E3/uL   Basophils Absolute 0.1 0.0 - 0.2 x10E3/uL   Immature Granulocytes 0 Not Estab. %   Immature Grans (Abs) 0.0 0.0 - 0.1 x10E3/uL  CMP14+EGFR     Status: Abnormal   Collection Time: 07/07/24  9:12 AM  Result Value Ref Range   Glucose 131 (H) 70 - 99 mg/dL   BUN 23 8 - 27 mg/dL   Creatinine, Ser 8.85 (H) 0.57 - 1.00 mg/dL   eGFR 50 (L) >40 fO/fpw/8.26   BUN/Creatinine Ratio 20 12 - 28   Sodium 139 134 - 144 mmol/L   Potassium 5.5 (H) 3.5 - 5.2 mmol/L   Chloride 103 96 - 106 mmol/L   CO2 18 (L) 20 - 29 mmol/L   Calcium 10.6 (H) 8.7 - 10.3 mg/dL   Total Protein 7.5 6.0 - 8.5 g/dL   Albumin 4.6 3.8 - 4.8 g/dL   Globulin, Total 2.9 1.5 - 4.5 g/dL   Bilirubin Total 0.3 0.0 - 1.2 mg/dL   Alkaline Phosphatase 68 44 - 121 IU/L   AST 23 0 - 40  IU/L   ALT 15 0 - 32 IU/L  Lipid Panel w/o Chol/HDL Ratio     Status: Abnormal   Collection Time: 07/07/24  9:12 AM  Result Value Ref Range   Cholesterol, Total 211 (H) 100 - 199 mg/dL   Triglycerides 655 (H) 0 - 149  mg/dL   HDL 19 (L) >60 mg/dL   VLDL Cholesterol Cal 62 (H) 5 - 40 mg/dL   LDL Chol Calc (NIH) 869 (H) 0 - 99 mg/dL  Hemoglobin J8r     Status: Abnormal   Collection Time: 07/07/24  9:12 AM  Result Value Ref Range   Hgb A1c MFr Bld 7.3 (H) 4.8 - 5.6 %    Comment:          Prediabetes: 5.7 - 6.4          Diabetes: >6.4          Glycemic control for adults with diabetes: <7.0    Est. average glucose Bld gHb Est-mCnc 163 mg/dL  Potassium     Status: None   Collection Time: 07/08/24 11:46 AM  Result Value Ref Range   Potassium 4.7 3.5 - 5.2 mmol/L  POC CREATINE & ALBUMIN,URINE     Status: Normal   Collection Time: 07/11/24  1:22 PM  Result Value Ref Range   Microalbumin Ur, POC 30 mg/L   Creatinine, POC 200 mg/dL   Albumin/Creatinine Ratio, Urine, POC <30       Assessment & Plan:  Continue current medications.  Monitor blood pressure and blood sugars.  Try glipizide  2.5 mg 3 times a day.  If not tolerating then just twice a day.  Strict diet control emphasized. Problem List Items Addressed This Visit     Hypertension associated with diabetes (HCC) - Primary   Obstructive sleep apnea of adult   Esophageal reflux   Type 2 diabetes mellitus with hyperglycemia, without long-term current use of insulin  (HCC)   Relevant Medications   ONETOUCH VERIO test strip   Other Relevant Orders   POC CREATINE & ALBUMIN,URINE (Completed)   Combined hyperlipidemia associated with type 2 diabetes mellitus (HCC)   Other Visit Diagnoses       Intertrigo       Relevant Medications   clotrimazole-betamethasone (LOTRISONE) cream   nystatin powder       Follow up 3 months.  Total time spent: 30 minutes  FERNAND FREDY RAMAN, MD  07/11/2024   This document may have been prepared  by [facility] Voice Recognition software and as such may include unintentional dictation errors.

## 2024-07-14 ENCOUNTER — Other Ambulatory Visit: Payer: Self-pay | Admitting: Internal Medicine

## 2024-07-14 DIAGNOSIS — E1165 Type 2 diabetes mellitus with hyperglycemia: Secondary | ICD-10-CM

## 2024-07-14 MED ORDER — ACCU-CHEK GUIDE TEST VI STRP
ORAL_STRIP | 12 refills | Status: DC
Start: 2024-07-14 — End: 2024-09-01

## 2024-07-14 MED ORDER — GLIPIZIDE 5 MG PO TABS: 2.5000 mg | ORAL_TABLET | Freq: Three times a day (TID) | ORAL | 1 refills | Status: DC

## 2024-08-06 ENCOUNTER — Inpatient Hospital Stay
Admission: RE | Admit: 2024-08-06 | Discharge: 2024-08-06 | Disposition: A | Discharge status: Home / Self Care | Payer: Self-pay | Source: Ambulatory Visit | Attending: Neurosurgery | Admitting: Neurosurgery

## 2024-08-06 ENCOUNTER — Other Ambulatory Visit: Payer: Self-pay | Admitting: Family Medicine

## 2024-08-06 DIAGNOSIS — Z049 Encounter for examination and observation for unspecified reason: Secondary | ICD-10-CM

## 2024-08-12 DIAGNOSIS — E119 Type 2 diabetes mellitus without complications: Secondary | ICD-10-CM | POA: Diagnosis not present

## 2024-08-12 DIAGNOSIS — H43813 Vitreous degeneration, bilateral: Secondary | ICD-10-CM | POA: Diagnosis not present

## 2024-08-12 DIAGNOSIS — H538 Other visual disturbances: Secondary | ICD-10-CM | POA: Diagnosis not present

## 2024-08-12 DIAGNOSIS — H2513 Age-related nuclear cataract, bilateral: Secondary | ICD-10-CM | POA: Diagnosis not present

## 2024-08-13 ENCOUNTER — Encounter: Payer: Self-pay | Admitting: Anesthesiology

## 2024-08-13 ENCOUNTER — Ambulatory Visit: Payer: Self-pay | Admitting: Pulmonary Disease

## 2024-08-14 ENCOUNTER — Ambulatory Visit: Admitting: Neurosurgery

## 2024-08-14 ENCOUNTER — Encounter: Payer: Self-pay | Admitting: Internal Medicine

## 2024-08-18 ENCOUNTER — Ambulatory Visit
Admission: RE | Admit: 2024-08-18 | Discharge: 2024-08-18 | Disposition: A | Discharge status: Home / Self Care | Source: Ambulatory Visit | Attending: Pulmonary Disease | Admitting: Pulmonary Disease

## 2024-08-18 DIAGNOSIS — J479 Bronchiectasis, uncomplicated: Secondary | ICD-10-CM | POA: Diagnosis not present

## 2024-08-18 DIAGNOSIS — J849 Interstitial pulmonary disease, unspecified: Secondary | ICD-10-CM

## 2024-08-18 DIAGNOSIS — J8410 Pulmonary fibrosis, unspecified: Secondary | ICD-10-CM | POA: Diagnosis not present

## 2024-08-20 DIAGNOSIS — H43813 Vitreous degeneration, bilateral: Secondary | ICD-10-CM | POA: Diagnosis not present

## 2024-08-20 DIAGNOSIS — H2513 Age-related nuclear cataract, bilateral: Secondary | ICD-10-CM | POA: Diagnosis not present

## 2024-08-20 NOTE — Progress Notes (Signed)
 1st attempt -- LVM to call back to get result note.

## 2024-08-21 NOTE — Telephone Encounter (Signed)
-----   Message from Dedra Sanders sent at 08/20/2024  3:11 PM EDT ----- The chest CT shows the scarring that was previously noted is stable.  The scarring may be related to other issues like rheumatoid arthritis.  Her blood work suggest that she may have rheumatoid  arthritis or some other type of arthritic condition that may be causing the changes in her lungs.  We can refer her to rheumatology to further evaluate. ----- Message ----- From: Interface, Rad Results In Sent: 08/20/2024   1:42 PM EDT To: Dedra LITTIE Sanders, MD

## 2024-08-21 NOTE — Telephone Encounter (Signed)
 Pt returning call concerning CT results.  Called x2 and no answer

## 2024-08-21 NOTE — Telephone Encounter (Signed)
 Copied from CRM [id number]. Topic: Clinical - Lab/Test Results >> Aug 21, 2024  2:20 PM Devaughn RAMAN wrote: Reason for CRM: Lela with [facility] called stating the patient called regarding her CT results, while patient on hold with Lela she disconnected the line, advised Lela someone would f/u with the patient regarding her CT results.

## 2024-08-21 NOTE — Telephone Encounter (Signed)
 I notified the patient of her CT results. She said she is not going to anymore doctors. She has had 3 CT's and is not doing anything else.   Dr. Tamea- just and FYI.  Nothing further needed.

## 2024-08-28 ENCOUNTER — Telehealth: Payer: Self-pay

## 2024-08-28 NOTE — Telephone Encounter (Signed)
 Patient LM asking for you to call back about her test strip and some medications possibly samples, the phone cut out a bit so I couldn't hear the last part well

## 2024-09-01 ENCOUNTER — Other Ambulatory Visit: Payer: Self-pay

## 2024-09-01 MED ORDER — ACCU-CHEK GUIDE TEST VI STRP: 1.0000 | ORAL_STRIP | Freq: Three times a day (TID) | 12 refills | Status: DC

## 2024-09-03 DIAGNOSIS — H2511 Age-related nuclear cataract, right eye: Secondary | ICD-10-CM | POA: Diagnosis not present

## 2024-09-04 ENCOUNTER — Ambulatory Visit

## 2024-09-04 ENCOUNTER — Other Ambulatory Visit: Payer: Self-pay | Admitting: Internal Medicine

## 2024-09-04 ENCOUNTER — Encounter: Payer: Self-pay | Admitting: Ophthalmology

## 2024-09-04 MED ORDER — ACCU-CHEK GUIDE TEST VI STRP: 1.0000 | ORAL_STRIP | Freq: Three times a day (TID) | 12 refills | Status: AC

## 2024-09-04 NOTE — Anesthesia Preprocedure Evaluation (Addendum)
 Anesthesia Evaluation  Patient identified by MRN, date of birth, ID band Patient awake    Reviewed: Allergy & Precautions, H&P , NPO status , Patient's Chart, lab work & pertinent test results  History of Anesthesia Complications (+) history of anesthetic complications  Airway Mallampati: IV  TM Distance: <3 FB     Dental  (+) Implants, Lower Dentures, Upper Dentures   Pulmonary neg pulmonary ROS, sleep apnea , former smoker          Cardiovascular hypertension, + CAD  negative cardio ROS   07-11-28 echo Left ventricle: The cavity size was normal. Wall thickness was    normal. Systolic function was normal. The estimated ejection    fraction was in the range of 55% to 65%.  - Aortic valve: Valve area (VTI): 2.64 cm^2. Valve area (Vmax):    2.42 cm^2. Valve area (Vmean): 2.26 cm^2.  - Mitral valve: Valve area by continuity equation (using LVOT    flow): 4.36 cm^2.  - Pulmonary arteries: PA peak pressure: 39 mm Hg (S).     Neuro/Psych  PSYCHIATRIC DISORDERS Anxiety Depression     Neuromuscular disease negative neurological ROS  negative psych ROS   GI/Hepatic negative GI ROS, Neg liver ROS,GERD  ,,  Endo/Other  negative endocrine ROSdiabetes    Renal/GU Renal diseasenegative Renal ROS  negative genitourinary   Musculoskeletal negative musculoskeletal ROS (+) Arthritis ,    Abdominal   Peds negative pediatric ROS (+)  Hematology negative hematology ROS (+)   Anesthesia Other Findings Blood sugar accucheck 160 today  Hypoxemia Right adrenal mass (HCC)  Hyperlipidemia LDL goal <100 Anxiety and depression  HTN, goal below 150/90 Allergic rhinitis with postnasal drip  Hair loss Vitamin D deficiency  Bronchitis, acute, with bronchospasm Arthritis, multiple joint involvement  Obstructive sleep apnea, adult on CPAP Arthritis  Aortic atherosclerosis (HCC) Pulmonary arterial hypertension (HCC) CAD (coronary  artery disease) Interstitial lung disease (HCC) Idiopathic pulmonary fibrosis (HCC) OSA on CPAP Morbid obesity (HCC) Type 2 diabetes mellitus (HCC) Dyspnea on exertion Interstitial lung disease (HCC) Hypertension associated with diabetes  Post herpetic neuralgia      Reproductive/Obstetrics negative OB ROS                              Anesthesia Physical Anesthesia Plan  ASA: 3  Anesthesia Plan: MAC   Post-op Pain Management:    Induction: Intravenous  PONV Risk Score and Plan:   Airway Management Planned: Natural Airway and Nasal Cannula  Additional Equipment:   Intra-op Plan:   Post-operative Plan:   Informed Consent: I have reviewed the patients History and Physical, chart, labs and discussed the procedure including the risks, benefits and alternatives for the proposed anesthesia with the patient or authorized representative who has indicated his/her understanding and acceptance.     Dental Advisory Given  Plan Discussed with: Anesthesiologist, CRNA and Surgeon  Anesthesia Plan Comments: (Patient consented for risks of anesthesia including but not limited to:  - adverse reactions to medications - damage to eyes, teeth, lips or other oral mucosa - nerve damage due to positioning  - sore throat or hoarseness - Damage to heart, brain, nerves, lungs, other parts of body or loss of life  Patient voiced understanding and assent.)         Anesthesia Quick Evaluation

## 2024-09-11 ENCOUNTER — Encounter: Payer: Self-pay | Admitting: Ophthalmology

## 2024-09-15 ENCOUNTER — Ambulatory Visit: Admitting: Pulmonary Disease

## 2024-09-17 ENCOUNTER — Encounter: Payer: Self-pay | Admitting: Ophthalmology

## 2024-09-18 NOTE — Discharge Instructions (Signed)

## 2024-09-22 ENCOUNTER — Encounter: Payer: Self-pay | Admitting: Ophthalmology

## 2024-09-22 ENCOUNTER — Ambulatory Visit
Admission: RE | Admit: 2024-09-22 | Discharge: 2024-09-22 | Disposition: A | Discharge status: Home / Self Care | Attending: Ophthalmology | Admitting: Ophthalmology

## 2024-09-22 ENCOUNTER — Encounter
Admission: RE | Disposition: A | Discharge status: Home / Self Care | Payer: Self-pay | Source: Home / Self Care | Attending: Ophthalmology

## 2024-09-22 ENCOUNTER — Ambulatory Visit: Payer: Self-pay | Admitting: Anesthesiology

## 2024-09-22 ENCOUNTER — Other Ambulatory Visit: Payer: Self-pay

## 2024-09-22 DIAGNOSIS — E1136 Type 2 diabetes mellitus with diabetic cataract: Secondary | ICD-10-CM | POA: Insufficient documentation

## 2024-09-22 DIAGNOSIS — K219 Gastro-esophageal reflux disease without esophagitis: Secondary | ICD-10-CM | POA: Diagnosis not present

## 2024-09-22 DIAGNOSIS — Z7984 Long term (current) use of oral hypoglycemic drugs: Secondary | ICD-10-CM | POA: Diagnosis not present

## 2024-09-22 DIAGNOSIS — N182 Chronic kidney disease, stage 2 (mild): Secondary | ICD-10-CM | POA: Diagnosis not present

## 2024-09-22 DIAGNOSIS — F32A Depression, unspecified: Secondary | ICD-10-CM | POA: Diagnosis not present

## 2024-09-22 DIAGNOSIS — H2511 Age-related nuclear cataract, right eye: Secondary | ICD-10-CM | POA: Insufficient documentation

## 2024-09-22 DIAGNOSIS — F419 Anxiety disorder, unspecified: Secondary | ICD-10-CM | POA: Diagnosis not present

## 2024-09-22 DIAGNOSIS — E1122 Type 2 diabetes mellitus with diabetic chronic kidney disease: Secondary | ICD-10-CM | POA: Diagnosis not present

## 2024-09-22 DIAGNOSIS — I2510 Atherosclerotic heart disease of native coronary artery without angina pectoris: Secondary | ICD-10-CM | POA: Insufficient documentation

## 2024-09-22 DIAGNOSIS — G4733 Obstructive sleep apnea (adult) (pediatric): Secondary | ICD-10-CM | POA: Diagnosis not present

## 2024-09-22 DIAGNOSIS — I129 Hypertensive chronic kidney disease with stage 1 through stage 4 chronic kidney disease, or unspecified chronic kidney disease: Secondary | ICD-10-CM | POA: Diagnosis not present

## 2024-09-22 DIAGNOSIS — I10 Essential (primary) hypertension: Secondary | ICD-10-CM | POA: Insufficient documentation

## 2024-09-22 DIAGNOSIS — Z87891 Personal history of nicotine dependence: Secondary | ICD-10-CM | POA: Insufficient documentation

## 2024-09-22 HISTORY — DX: Other forms of dyspnea: R06.09

## 2024-09-22 HISTORY — DX: Interstitial pulmonary disease, unspecified: J84.9

## 2024-09-22 HISTORY — DX: Type 2 diabetes mellitus without complications: E11.9

## 2024-09-22 HISTORY — DX: Hypoxemia: R09.02

## 2024-09-22 HISTORY — DX: Idiopathic pulmonary fibrosis: J84.112

## 2024-09-22 HISTORY — DX: Other postherpetic nervous system involvement: B02.29

## 2024-09-22 HISTORY — DX: Hypertension secondary to endocrine disorders: I15.2

## 2024-09-22 HISTORY — DX: Other complications of anesthesia, initial encounter: T88.59XA

## 2024-09-22 HISTORY — DX: Obstructive sleep apnea (adult) (pediatric): G47.33

## 2024-09-22 HISTORY — PX: CATARACT EXTRACTION W/PHACO: SHX586

## 2024-09-22 HISTORY — DX: Gastro-esophageal reflux disease without esophagitis: K21.9

## 2024-09-22 HISTORY — DX: Morbid (severe) obesity due to excess calories: E66.01

## 2024-09-22 HISTORY — DX: Type 2 diabetes mellitus with other circulatory complications: E11.59

## 2024-09-22 LAB — GLUCOSE, CAPILLARY: Glucose-Capillary: 160 mg/dL — ABNORMAL HIGH (ref 70–99)

## 2024-09-22 SURGERY — PHACOEMULSIFICATION, CATARACT, WITH IOL INSERTION
Anesthesia: Monitor Anesthesia Care | Site: Eye | Laterality: Right

## 2024-09-22 MED ORDER — LACTATED RINGERS IV SOLN
INTRAVENOUS | Status: DC
Start: 2024-09-22 — End: 2024-09-22

## 2024-09-22 MED ORDER — SIGHTPATH DOSE#1 BSS IO SOLN
INTRAOCULAR | Status: DC | PRN
  Administered 2024-09-22: 79 mL via OPHTHALMIC

## 2024-09-22 MED ORDER — TETRACAINE HCL 0.5 % OP SOLN
1.0000 [drp] | OPHTHALMIC | Status: DC | PRN
Start: 2024-09-22 — End: 2024-09-22
  Administered 2024-09-22 (×3): 1 [drp] via OPHTHALMIC

## 2024-09-22 MED ORDER — SIGHTPATH DOSE#1 BSS IO SOLN
INTRAOCULAR | Status: DC | PRN
  Administered 2024-09-22: 15 mL via INTRAOCULAR

## 2024-09-22 MED ORDER — ARMC OPHTHALMIC DILATING DROPS
1.0000 | OPHTHALMIC | Status: DC | PRN
  Administered 2024-09-22 (×3): 1 via OPHTHALMIC

## 2024-09-22 MED ORDER — LIDOCAINE HCL (PF) 2 % IJ SOLN
INTRAOCULAR | Status: DC | PRN
  Administered 2024-09-22: 4 mL via INTRAOCULAR

## 2024-09-22 MED ORDER — MOXIFLOXACIN HCL 0.5 % OP SOLN
OPHTHALMIC | Status: DC | PRN
  Administered 2024-09-22: .2 mL via OPHTHALMIC

## 2024-09-22 MED ORDER — TETRACAINE HCL 0.5 % OP SOLN
OPHTHALMIC | Status: AC
  Filled 2024-09-22: qty 4

## 2024-09-22 MED ORDER — MIDAZOLAM HCL 2 MG/2ML IJ SOLN
INTRAMUSCULAR | Status: AC
  Filled 2024-09-22: qty 2

## 2024-09-22 MED ORDER — MIDAZOLAM HCL 2 MG/2ML IJ SOLN
INTRAMUSCULAR | Status: DC | PRN
  Administered 2024-09-22: 2 mg via INTRAVENOUS

## 2024-09-22 MED ORDER — FENTANYL CITRATE (PF) 100 MCG/2ML IJ SOLN
INTRAMUSCULAR | Status: AC
  Filled 2024-09-22: qty 2

## 2024-09-22 MED ORDER — SIGHTPATH DOSE#1 NA HYALUR & NA CHOND-NA HYALUR IO KIT
PACK | INTRAOCULAR | Status: DC | PRN
  Administered 2024-09-22: 1 via OPHTHALMIC

## 2024-09-22 MED ORDER — FENTANYL CITRATE (PF) 100 MCG/2ML IJ SOLN
INTRAMUSCULAR | Status: DC | PRN
  Administered 2024-09-22 (×2): 50 ug via INTRAVENOUS

## 2024-09-22 SURGICAL SUPPLY — 9 items
DISSECTOR HYDRO NUCLEUS 50X22 (MISCELLANEOUS) ×1 IMPLANT
FEE CATARACT SUITE SIGHTPATH (MISCELLANEOUS) ×1 IMPLANT
GLOVE PI ULTRA LF STRL 7.5 (GLOVE) ×1 IMPLANT
GLOVE SURG SYN 6.5 PF PI BL (GLOVE) ×1 IMPLANT
GLOVE SURG SYN 8.5 PF PI BL (GLOVE) ×1 IMPLANT
LENS IOL TECNIS EYHANCE 21.5 (Intraocular Lens) IMPLANT
NDL FILTER BLUNT 18X1 1/2 (NEEDLE) ×1 IMPLANT
NEEDLE FILTER BLUNT 18X1 1/2 (NEEDLE) ×1 IMPLANT
SYR 3ML LL SCALE MARK (SYRINGE) ×1 IMPLANT

## 2024-09-22 NOTE — Op Note (Signed)
 OPERATIVE NOTE  [name] 09/22/2024   PREOPERATIVE DIAGNOSIS:  Nuclear sclerotic cataract right eye.  H25.11   POSTOPERATIVE DIAGNOSIS:    Nuclear sclerotic cataract right eye.     PROCEDURE:  Phacoemusification with posterior chamber intraocular lens placement of the right eye   LENS:   Implant Name Type Inv. Item Serial No. Manufacturer Lot No. LRB No. Used Action  LENS IOL TECNIS EYHANCE 21.5 - [id number] Intraocular Lens LENS IOL TECNIS EYHANCE 21.5 [id number] [employer]  Right 1 Implanted       Procedure(s): PHACOEMULSIFICATION, CATARACT, WITH IOL INSERTION 7.30 00:45.3 (Right)  SURGEON:  Adine Novak, MD, MPH  ANESTHESIOLOGIST: Anesthesiologist: Ola Donny BROCKS, MD CRNA: Veronica Alm BROCKS, CRNA   ANESTHESIA:  Topical with tetracaine drops augmented with 1% preservative-free intracameral lidocaine .  ESTIMATED BLOOD LOSS: less than 1 mL.   COMPLICATIONS:  None.   DESCRIPTION OF PROCEDURE:  The patient was identified in the holding room and transported to the operating room and placed in the supine position under the operating microscope.  The right eye was identified as the operative eye and it was prepped and draped in the usual sterile ophthalmic fashion.   A 1.0 millimeter clear-corneal paracentesis was made at the 10:30 position. 0.5 ml of preservative-free 1% lidocaine  with epinephrine was injected into the anterior chamber.  The anterior chamber was filled with viscoelastic.  A 2.4 millimeter keratome was used to make a near-clear corneal incision at the 8:00 position.  A curvilinear capsulorrhexis was made with a cystotome and capsulorrhexis forceps.  Balanced salt solution was used to hydrodissect and hydrodelineate the nucleus.   Phacoemulsification was then used in stop and chop fashion to remove the lens nucleus and epinucleus.  The remaining cortex was then removed using the irrigation and aspiration handpiece. Viscoelastic was then placed into the  capsular bag to distend it for lens placement.  A lens was then injected into the capsular bag.  The remaining viscoelastic was aspirated.   Wounds were hydrated with balanced salt solution.  The anterior chamber was inflated to a physiologic pressure with balanced salt solution.   Intracameral vigamox 0.1 mL undiluted was injected into the eye and a drop placed onto the ocular surface.  No wound leaks were noted.  The patient was taken to the recovery room in stable condition without complications of anesthesia or surgery  Adine Novak 09/22/2024, 9:52 AM

## 2024-09-22 NOTE — H&P (Signed)
 [facility]   Primary Care Physician:  Fernand Fredy RAMAN, MD Ophthalmologist: Dr. Adine Novak  Pre-Procedure History & Physical: HPI:  Taylor Barry is a 78 y.o. female here for cataract surgery.   Past Medical History:  Diagnosis Date   Allergic rhinitis with postnasal drip    Anxiety and depression    Aortic atherosclerosis    Arthritis    Arthritis, multiple joint involvement    CAD (coronary artery disease)    Complication of anesthesia    has trouble waking up   Dyspnea on exertion    GERD (gastroesophageal reflux disease)    Hair loss    HTN, goal below 150/90    Hyperlipidemia LDL goal <100    Hypertension associated with diabetes (HCC)    Hypoxemia    Idiopathic pulmonary fibrosis (HCC)    Interstitial lung disease (HCC)    Interstitial lung disease (HCC)    Morbid obesity (HCC)    Obstructive sleep apnea, adult    CPAP   OSA on CPAP    Post herpetic neuralgia    Pulmonary arterial hypertension (HCC)    Right adrenal mass    Type 2 diabetes mellitus (HCC)    Vitamin D deficiency     Past Surgical History:  Procedure Laterality Date   ABDOMINAL HYSTERECTOMY  1968   total   COLONOSCOPY WITH PROPOFOL  N/A 04/15/2021   Procedure: COLONOSCOPY WITH PROPOFOL ;  Surgeon: Jinny Carmine, MD;  Location: [facility];  Service: Endoscopy;  Laterality: N/A;  Daibetic - oral meds priority 4   REPLACEMENT TOTAL KNEE Bilateral    Right knee first, Left knee more recent   TYMPANOPLASTY Right 1977    Prior to Admission medications   Medication Sig Start Date End Date Taking? Authorizing Provider  Bempedoic Acid -Ezetimibe (NEXLIZET ) 180-10 MG TABS Take 1 tablet by mouth daily. 07/11/24 07/11/25 Yes Fernand Fredy RAMAN, MD  fenofibrate  (TRICOR ) 145 MG tablet Take 1 tablet (145 mg total) by mouth daily. 01/08/24  Yes Fernand Fredy RAMAN, MD  glipiZIDE  (GLUCOTROL ) 5 MG tablet Take 0.5 tablets (2.5 mg total) by mouth in the morning, at noon, and at bedtime. 07/14/24  Yes Fernand Fredy RAMAN, MD  losartan -hydrochlorothiazide  (HYZAAR) 100-12.5 MG tablet TAKE 1 TABLET BY MOUTH ONCE  DAILY 06/13/24  Yes Fernand Fredy RAMAN, MD  nystatin  powder Apply 1 Application topically 3 (three) times daily. 07/11/24  Yes Fernand Fredy RAMAN, MD  sertraline  (ZOLOFT ) 50 MG tablet Take 1 tablet (50 mg total) by mouth daily. Patient taking differently: Take 25 mg by mouth daily. 05/22/24 05/22/25 Yes Fernand Fredy RAMAN, MD  clotrimazole -betamethasone  (LOTRISONE ) cream Apply 1 Application topically 2 (two) times daily. Patient not taking: Reported on 09/17/2024 07/11/24   Fernand Fredy RAMAN, MD  glucose blood (ACCU-CHEK GUIDE TEST) test strip 1 each by Other route in the morning, at noon, and at bedtime. Use as instructed 09/04/24   Fernand Fredy RAMAN, MD  pantoprazole  (PROTONIX ) 40 MG tablet Take 1 tablet (40 mg total) by mouth daily. 01/04/24   Fernand Fredy RAMAN, MD    Allergies as of 08/22/2024 - Review Complete 07/11/2024  Allergen Reaction Noted   Acetaminophen   03/24/2021   Codeine Nausea Only 07/28/2015   Hydrocodone -acetaminophen  Other (See Comments) 07/10/2018   Oxycodone hcl  03/24/2021   Oxycodone-acetaminophen   07/28/2015   Prednisolone  07/28/2015   Statins  02/06/2023    Family History  Problem Relation Age of Onset   Heart disease Mother  Alcohol abuse Father    Diabetes Daughter    Breast cancer Maternal Aunt    Cancer Brother    Cancer Maternal Grandmother    Cancer Maternal Grandfather     Social History   Socioeconomic History   Marital status: Divorced    Spouse name: Not on file   Number of children: Not on file   Years of education: Not on file   Highest education level: Not on file  Occupational History   Not on file  Tobacco Use   Smoking status: Former    Current packs/day: 0.00    Types: Cigarettes    Quit date: 01/26/1992    Years since quitting: 32.6   Smokeless tobacco: Never   Tobacco comments:    Started smoking in the mid 80's    Smoked 0.25 PPD at the heaviest.       Vaping Use   Vaping status: Never Used  Substance and Sexual Activity   Alcohol use: No    Alcohol/week: 0.0 standard drinks of alcohol   Drug use: No   Sexual activity: Never  Other Topics Concern   Not on file  Social History Narrative   Not on file   Social Drivers of Health   Financial Resource Strain: Low Risk  (01/01/2024)   Received from [facility]   Overall Financial Resource Strain (CARDIA)    Difficulty of Paying Living Expenses: Not hard at all  Food Insecurity: No Food Insecurity (01/01/2024)   Received from [facility]   Hunger Vital Sign    Within the past 12 months, you worried that your food would run out before you got the money to buy more.: Never true    Within the past 12 months, the food you bought just didn't last and you didn't have money to get more.: Never true  Transportation Needs: No Transportation Needs (01/01/2024)   Received from [facility] - Transportation    In the past 12 months, has lack of transportation kept you from medical appointments or from getting medications?: No    Lack of Transportation (Non-Medical): No  Physical Activity: Not on file  Stress: Not on file  Social Connections: Unknown (05/09/2022)   Received from [facility]   Social Network    Social Network: Not on file  Intimate Partner Violence: Unknown (03/31/2022)   Received from [facility]   HITS    Physically Hurt: Not on file    Insult or Talk Down To: Not on file    Threaten Physical Harm: Not on file    Scream or Curse: Not on file    Review of Systems: See HPI, otherwise negative ROS  Physical Exam: BP (!) 157/98   Pulse 66   Temp (!) 97.2 F (36.2 C) (Temporal)   Resp 16   Ht 5' 6 (1.676 m)   Wt 110.9 kg   SpO2 95%   BMI 39.46 kg/m  General:   Alert, cooperative. Head:  Normocephalic and atraumatic. Respiratory:  Normal work of breathing. Cardiovascular:   NAD  Impression/Plan: Taylor Barry is here for cataract surgery.  Risks, benefits, limitations, and alternatives regarding cataract surgery have been reviewed with the patient.  Questions have been answered.  All parties agreeable.   Adine Novak, MD  09/22/2024, 9:27 AM

## 2024-09-22 NOTE — Transfer of Care (Signed)
 Immediate Anesthesia Transfer of Care Note  Patient: Taylor Barry  Procedure(s) Performed: PHACOEMULSIFICATION, CATARACT, WITH IOL INSERTION 7.30 00:45.3 (Right: Eye)  Patient Location: PACU  Anesthesia Type: MAC  Level of Consciousness: awake, alert  and patient cooperative  Airway and Oxygen Therapy: Patient Spontanous Breathing and Patient connected to supplemental oxygen  Post-op Assessment: Post-op Vital signs reviewed, Patient's Cardiovascular Status Stable, Respiratory Function Stable, Patent Airway and No signs of Nausea or vomiting  Post-op Vital Signs: Reviewed and stable  Complications: No notable events documented.

## 2024-09-22 NOTE — Anesthesia Postprocedure Evaluation (Signed)
 Anesthesia Post Note  Patient: Ronal DELENA Polite  Procedure(s) Performed: PHACOEMULSIFICATION, CATARACT, WITH IOL INSERTION 7.30 00:45.3 (Right: Eye)  Patient location during evaluation: PACU Anesthesia Type: MAC Level of consciousness: awake and alert Pain management: pain level controlled Vital Signs Assessment: post-procedure vital signs reviewed and stable Respiratory status: spontaneous breathing, nonlabored ventilation, respiratory function stable and patient connected to nasal cannula oxygen Cardiovascular status: stable and blood pressure returned to baseline Postop Assessment: no apparent nausea or vomiting Anesthetic complications: no   No notable events documented.   Last Vitals:  Vitals:   09/22/24 0953 09/22/24 1001  BP: (!) 147/70 (!) 160/75  Pulse: 73 79  Resp: 15 14  Temp: (!) 36.1 C (!) 36.1 C  SpO2: 95% 93%    Last Pain:  Vitals:   09/22/24 1001  TempSrc:   PainSc: 0-No pain                 Taos Tapp C Tiffany Calmes

## 2024-09-29 NOTE — Anesthesia Preprocedure Evaluation (Addendum)
 Anesthesia Evaluation  Patient identified by MRN, date of birth, ID band Patient awake    Reviewed: Allergy & Precautions, H&P , NPO status , Patient's Chart, lab work & pertinent test results  History of Anesthesia Complications (+) history of anesthetic complications  Airway Mallampati: IV  TM Distance: <3 FB Neck ROM: Full    Dental no notable dental hx. (+) Implants, Lower Dentures, Upper Dentures Implants, Lower Dentures, Upper Dentures   :   Pulmonary neg pulmonary ROS, sleep apnea , former smoker   Pulmonary exam normal breath sounds clear to auscultation       Cardiovascular hypertension, + CAD  negative cardio ROS Normal cardiovascular exam Rhythm:Regular Rate:Normal  07-11-28 echo Left ventricle: The cavity size was normal. Wall thickness was    normal. Systolic function was normal. The estimated ejection    fraction was in the range of 55% to 65%.  - Aortic valve: Valve area (VTI): 2.64 cm^2. Valve area (Vmax):    2.42 cm^2. Valve area (Vmean): 2.26 cm^2.  - Mitral valve: Valve area by continuity equation (using LVOT    flow): 4.36 cm^2.  - Pulmonary arteries: PA peak pressure: 39 mm Hg (S).     Neuro/Psych  PSYCHIATRIC DISORDERS Anxiety Depression     Neuromuscular disease negative neurological ROS  negative psych ROS   GI/Hepatic negative GI ROS, Neg liver ROS,GERD  ,,  Endo/Other  negative endocrine ROSdiabetes    Renal/GU Renal diseasenegative Renal ROS  negative genitourinary   Musculoskeletal negative musculoskeletal ROS (+) Arthritis ,    Abdominal   Peds negative pediatric ROS (+)  Hematology negative hematology ROS (+)   Anesthesia Other Findings Previous cataract surgery 09-22-24 Dr. Ola   Blood sugar accucheck 160 previous surgery   Hypoxemia Right adrenal mass (HCC)   Hyperlipidemia LDL goal <100 Anxiety and depression           HTN, goal below 150/90 Allergic rhinitis with  postnasal drip      Hair loss Vitamin D deficiency    Bronchitis, acute, with bronchospasm Arthritis, multiple joint involvement       Obstructive sleep apnea, adult on CPAP Arthritis             Aortic atherosclerosis (HCC) Pulmonary arterial hypertension CAD (coronary artery disease) Interstitial lung disease (HCC Idiopathic pulmonary fibrosis (HCC) OSA on CPAPMorbid obesity (HCC) Type 2 diabetes mellitus (HCC) Dyspnea on exertion Interstitial lung disease (HCC)   Hypertension associated with diabetes  Post herpetic neuralgia                 Reproductive/Obstetrics negative OB ROS                              Anesthesia Physical Anesthesia Plan  ASA: 3  Anesthesia Plan: MAC   Post-op Pain Management:    Induction: Intravenous  PONV Risk Score and Plan:   Airway Management Planned: Natural Airway and Nasal Cannula  Additional Equipment:   Intra-op Plan:   Post-operative Plan:   Informed Consent: I have reviewed the patients History and Physical, chart, labs and discussed the procedure including the risks, benefits and alternatives for the proposed anesthesia with the patient or authorized representative who has indicated his/her understanding and acceptance.     Dental Advisory Given  Plan Discussed with: Anesthesiologist, CRNA and Surgeon  Anesthesia Plan Comments: (Patient consented for risks of anesthesia including but not limited to:  - adverse reactions to  medications - damage to eyes, teeth, lips or other oral mucosa - nerve damage due to positioning  - sore throat or hoarseness - Damage to heart, brain, nerves, lungs, other parts of body or loss of life  Patient voiced understanding and assent.)         Anesthesia Quick Evaluation

## 2024-09-30 ENCOUNTER — Encounter: Payer: Self-pay | Admitting: Ophthalmology

## 2024-09-30 ENCOUNTER — Other Ambulatory Visit: Payer: Self-pay

## 2024-10-02 NOTE — Discharge Instructions (Signed)

## 2024-10-06 ENCOUNTER — Ambulatory Visit: Payer: Self-pay | Admitting: Anesthesiology

## 2024-10-06 ENCOUNTER — Ambulatory Visit
Admission: RE | Admit: 2024-10-06 | Discharge: 2024-10-06 | Disposition: A | Discharge status: Home / Self Care | Attending: Ophthalmology | Admitting: Ophthalmology

## 2024-10-06 ENCOUNTER — Encounter
Admission: RE | Disposition: A | Discharge status: Home / Self Care | Payer: Self-pay | Source: Home / Self Care | Attending: Ophthalmology

## 2024-10-06 ENCOUNTER — Other Ambulatory Visit: Payer: Self-pay

## 2024-10-06 ENCOUNTER — Encounter: Payer: Self-pay | Admitting: Ophthalmology

## 2024-10-06 DIAGNOSIS — H2512 Age-related nuclear cataract, left eye: Secondary | ICD-10-CM | POA: Diagnosis not present

## 2024-10-06 DIAGNOSIS — G4733 Obstructive sleep apnea (adult) (pediatric): Secondary | ICD-10-CM | POA: Insufficient documentation

## 2024-10-06 DIAGNOSIS — Z9841 Cataract extraction status, right eye: Secondary | ICD-10-CM | POA: Diagnosis not present

## 2024-10-06 DIAGNOSIS — E1136 Type 2 diabetes mellitus with diabetic cataract: Secondary | ICD-10-CM | POA: Insufficient documentation

## 2024-10-06 DIAGNOSIS — Z7984 Long term (current) use of oral hypoglycemic drugs: Secondary | ICD-10-CM | POA: Diagnosis not present

## 2024-10-06 DIAGNOSIS — I10 Essential (primary) hypertension: Secondary | ICD-10-CM | POA: Insufficient documentation

## 2024-10-06 DIAGNOSIS — K219 Gastro-esophageal reflux disease without esophagitis: Secondary | ICD-10-CM | POA: Diagnosis not present

## 2024-10-06 DIAGNOSIS — F418 Other specified anxiety disorders: Secondary | ICD-10-CM | POA: Diagnosis not present

## 2024-10-06 DIAGNOSIS — I2510 Atherosclerotic heart disease of native coronary artery without angina pectoris: Secondary | ICD-10-CM | POA: Diagnosis not present

## 2024-10-06 DIAGNOSIS — Z961 Presence of intraocular lens: Secondary | ICD-10-CM | POA: Diagnosis not present

## 2024-10-06 DIAGNOSIS — Z87891 Personal history of nicotine dependence: Secondary | ICD-10-CM | POA: Diagnosis not present

## 2024-10-06 DIAGNOSIS — G4730 Sleep apnea, unspecified: Secondary | ICD-10-CM | POA: Diagnosis not present

## 2024-10-06 DIAGNOSIS — E782 Mixed hyperlipidemia: Secondary | ICD-10-CM | POA: Diagnosis not present

## 2024-10-06 HISTORY — PX: CATARACT EXTRACTION W/PHACO: SHX586

## 2024-10-06 LAB — GLUCOSE, CAPILLARY: Glucose-Capillary: 168 mg/dL — ABNORMAL HIGH (ref 70–99)

## 2024-10-06 SURGERY — PHACOEMULSIFICATION, CATARACT, WITH IOL INSERTION
Anesthesia: Monitor Anesthesia Care | Site: Eye | Laterality: Left

## 2024-10-06 MED ORDER — ARMC OPHTHALMIC DILATING DROPS
OPHTHALMIC | Status: AC
  Filled 2024-10-06: qty 0.5

## 2024-10-06 MED ORDER — FENTANYL CITRATE (PF) 100 MCG/2ML IJ SOLN
INTRAMUSCULAR | Status: DC | PRN
  Administered 2024-10-06: 100 ug via INTRAVENOUS

## 2024-10-06 MED ORDER — ARMC OPHTHALMIC DILATING DROPS
1.0000 | OPHTHALMIC | Status: DC | PRN
  Administered 2024-10-06 (×3): 1 via OPHTHALMIC

## 2024-10-06 MED ORDER — SIGHTPATH DOSE#1 BSS IO SOLN
INTRAOCULAR | Status: DC | PRN
  Administered 2024-10-06: 15 mL via INTRAOCULAR

## 2024-10-06 MED ORDER — MIDAZOLAM HCL 5 MG/5ML IJ SOLN
INTRAMUSCULAR | Status: DC | PRN
  Administered 2024-10-06: 2 mg via INTRAVENOUS

## 2024-10-06 MED ORDER — LIDOCAINE HCL (PF) 2 % IJ SOLN
INTRAOCULAR | Status: DC | PRN
  Administered 2024-10-06: 4 mL via INTRAOCULAR

## 2024-10-06 MED ORDER — TETRACAINE HCL 0.5 % OP SOLN
1.0000 [drp] | OPHTHALMIC | Status: DC | PRN
  Administered 2024-10-06 (×3): 1 [drp] via OPHTHALMIC

## 2024-10-06 MED ORDER — LACTATED RINGERS IV SOLN
INTRAVENOUS | Status: DC
Start: 2024-10-06 — End: 2024-10-06

## 2024-10-06 MED ORDER — SIGHTPATH DOSE#1 NA HYALUR & NA CHOND-NA HYALUR IO KIT
PACK | INTRAOCULAR | Status: DC | PRN
  Administered 2024-10-06: 1 via OPHTHALMIC

## 2024-10-06 MED ORDER — FENTANYL CITRATE (PF) 100 MCG/2ML IJ SOLN
INTRAMUSCULAR | Status: AC
  Filled 2024-10-06: qty 2

## 2024-10-06 MED ORDER — MOXIFLOXACIN HCL 0.5 % OP SOLN
OPHTHALMIC | Status: DC | PRN
  Administered 2024-10-06: .2 mL via OPHTHALMIC

## 2024-10-06 MED ORDER — MIDAZOLAM HCL 2 MG/2ML IJ SOLN
INTRAMUSCULAR | Status: AC
  Filled 2024-10-06: qty 2

## 2024-10-06 MED ORDER — TETRACAINE HCL 0.5 % OP SOLN
OPHTHALMIC | Status: AC
  Filled 2024-10-06: qty 4

## 2024-10-06 SURGICAL SUPPLY — 9 items
DISSECTOR HYDRO NUCLEUS 50X22 (MISCELLANEOUS) ×1 IMPLANT
FEE CATARACT SUITE SIGHTPATH (MISCELLANEOUS) ×1 IMPLANT
GLOVE PI ULTRA LF STRL 7.5 (GLOVE) ×1 IMPLANT
GLOVE SURG SYN 6.5 PF PI BL (GLOVE) ×1 IMPLANT
GLOVE SURG SYN 8.5 PF PI BL (GLOVE) ×1 IMPLANT
LENS IOL TECNIS EYHANCE 21.5 (Intraocular Lens) IMPLANT
NDL FILTER BLUNT 18X1 1/2 (NEEDLE) ×1 IMPLANT
NEEDLE FILTER BLUNT 18X1 1/2 (NEEDLE) ×1 IMPLANT
SYR 3ML LL SCALE MARK (SYRINGE) ×1 IMPLANT

## 2024-10-06 NOTE — Op Note (Signed)
 OPERATIVE NOTE  Taylor Barry [id number] 10/06/2024   PREOPERATIVE DIAGNOSIS:  Nuclear sclerotic cataract left eye.  H25.12   POSTOPERATIVE DIAGNOSIS:    Nuclear sclerotic cataract left eye.     PROCEDURE:  Phacoemusification with posterior chamber intraocular lens placement of the left eye   LENS:   Implant Name Type Inv. Item Serial No. Manufacturer Lot No. LRB No. Used Action  LENS IOL TECNIS EYHANCE 21.5 - [id number] Intraocular Lens LENS IOL TECNIS EYHANCE 21.5 [id number] [employer]  Left 1 Implanted      Procedure(s): PHACOEMULSIFICATION, CATARACT, WITH IOL INSERTION 4.65 00:36.5 (Left)  SURGEON:  Adine Novak, MD, MPH   ANESTHESIA:  Topical with tetracaine drops augmented with 1% preservative-free intracameral lidocaine .  ESTIMATED BLOOD LOSS: <1 mL   COMPLICATIONS:  None.   DESCRIPTION OF PROCEDURE:  The patient was identified in the holding room and transported to the operating room and placed in the supine position under the operating microscope.  The left eye was identified as the operative eye and it was prepped and draped in the usual sterile ophthalmic fashion.   A 1.0 millimeter clear-corneal paracentesis was made at the 5:00 position. 0.5 ml of preservative-free 1% lidocaine  with epinephrine was injected into the anterior chamber.  The anterior chamber was filled with viscoelastic.  A 2.4 millimeter keratome was used to make a near-clear corneal incision at the 2:00 position.  A curvilinear capsulorrhexis was made with a cystotome and capsulorrhexis forceps.  Balanced salt solution was used to hydrodissect and hydrodelineate the nucleus.   Phacoemulsification was then used in stop and chop fashion to remove the lens nucleus and epinucleus.  The remaining cortex was then removed using the irrigation and aspiration handpiece. Viscoelastic was then placed into the capsular bag to distend it for lens placement.  A lens was then injected into the capsular bag.  The  remaining viscoelastic was aspirated.   Wounds were hydrated with balanced salt solution.  The anterior chamber was inflated to a physiologic pressure with balanced salt solution.  Intracameral vigamox 0.1 mL undiltued was injected into the eye and a drop placed onto the ocular surface.  No wound leaks were noted.  The patient was taken to the recovery room in stable condition without complications of anesthesia or surgery  Adine Novak 10/06/2024, 7:47 AM

## 2024-10-06 NOTE — Transfer of Care (Signed)
 Immediate Anesthesia Transfer of Care Note  Patient: Taylor Barry  Procedure(s) Performed: PHACOEMULSIFICATION, CATARACT, WITH IOL INSERTION 4.65 00:36.5 (Left: Eye)  Patient Location: PACU  Anesthesia Type: MAC  Level of Consciousness: awake, alert  and patient cooperative  Airway and Oxygen Therapy: Patient Spontanous Breathing and Patient connected to supplemental oxygen  Post-op Assessment: Post-op Vital signs reviewed, Patient's Cardiovascular Status Stable, Respiratory Function Stable, Patent Airway and No signs of Nausea or vomiting  Post-op Vital Signs: Reviewed and stable  Complications: No notable events documented.

## 2024-10-06 NOTE — Anesthesia Postprocedure Evaluation (Signed)
 Anesthesia Post Note  Patient: Taylor Barry  Procedure(s) Performed: PHACOEMULSIFICATION, CATARACT, WITH IOL INSERTION 4.65 00:36.5 (Left: Eye)  Patient location during evaluation: PACU Anesthesia Type: MAC Level of consciousness: awake and alert Pain management: pain level controlled Vital Signs Assessment: post-procedure vital signs reviewed and stable Respiratory status: spontaneous breathing, nonlabored ventilation, respiratory function stable and patient connected to nasal cannula oxygen Cardiovascular status: stable and blood pressure returned to baseline Postop Assessment: no apparent nausea or vomiting Anesthetic complications: no   No notable events documented.   Last Vitals:  Vitals:   10/06/24 0749 10/06/24 0756  BP: (!) 165/73 (!) 168/61  Pulse: 76 68  Resp: 17 17  Temp: (!) 36.1 C (!) 36.1 C  SpO2: 96% 95%    Last Pain:  Vitals:   10/06/24 0756  TempSrc:   PainSc: 0-No pain                 Wildon Cuevas C Sharlon Pfohl

## 2024-10-06 NOTE — H&P (Signed)
 [facility]   Primary Care Physician:  Fernand Fredy RAMAN, MD Ophthalmologist: Dr. Adine Novak  Pre-Procedure History & Physical: HPI:  Taylor Barry is a 78 y.o. female here for cataract surgery.   Past Medical History:  Diagnosis Date   Allergic rhinitis with postnasal drip    Anxiety and depression    Aortic atherosclerosis    Arthritis    Arthritis, multiple joint involvement    CAD (coronary artery disease)    Complication of anesthesia    has trouble waking up   Dyspnea on exertion    GERD (gastroesophageal reflux disease)    Hair loss    HTN, goal below 150/90    Hyperlipidemia LDL goal <100    Hypertension associated with diabetes (HCC)    Hypoxemia    Idiopathic pulmonary fibrosis (HCC)    Interstitial lung disease (HCC)    Interstitial lung disease (HCC)    Morbid obesity (HCC)    Obstructive sleep apnea, adult    CPAP   OSA on CPAP    Post herpetic neuralgia    Pulmonary arterial hypertension (HCC)    Right adrenal mass    Type 2 diabetes mellitus (HCC)    Vitamin D deficiency     Past Surgical History:  Procedure Laterality Date   ABDOMINAL HYSTERECTOMY  1968   total   CATARACT EXTRACTION W/PHACO Right 09/22/2024   Procedure: PHACOEMULSIFICATION, CATARACT, WITH IOL INSERTION 7.30 00:45.3;  Surgeon: Novak Adine Anes, MD;  Location: [facility];  Service: Ophthalmology;  Laterality: Right;   COLONOSCOPY WITH PROPOFOL  N/A 04/15/2021   Procedure: COLONOSCOPY WITH PROPOFOL ;  Surgeon: Jinny Carmine, MD;  Location: [facility];  Service: Endoscopy;  Laterality: N/A;  Daibetic - oral meds priority 4   REPLACEMENT TOTAL KNEE Bilateral    Right knee first, Left knee more recent   TYMPANOPLASTY Right 1977    Prior to Admission medications   Medication Sig Start Date End Date Taking? Authorizing Provider  Bempedoic Acid -Ezetimibe (NEXLIZET ) 180-10 MG TABS Take 1 tablet by mouth daily. 07/11/24 07/11/25 Yes Fernand Fredy RAMAN, MD   clotrimazole -betamethasone  (LOTRISONE ) cream Apply 1 Application topically 2 (two) times daily. 07/11/24  Yes Fernand Fredy RAMAN, MD  fenofibrate  (TRICOR ) 145 MG tablet Take 1 tablet (145 mg total) by mouth daily. 01/08/24  Yes Fernand Fredy RAMAN, MD  glipiZIDE  (GLUCOTROL ) 5 MG tablet Take 0.5 tablets (2.5 mg total) by mouth in the morning, at noon, and at bedtime. 07/14/24  Yes Fernand Fredy RAMAN, MD  glucose blood (ACCU-CHEK GUIDE TEST) test strip 1 each by Other route in the morning, at noon, and at bedtime. Use as instructed 09/04/24  Yes Fernand Fredy RAMAN, MD  losartan -hydrochlorothiazide  (HYZAAR) 100-12.5 MG tablet TAKE 1 TABLET BY MOUTH ONCE  DAILY 06/13/24  Yes Fernand Fredy RAMAN, MD  nystatin  powder Apply 1 Application topically 3 (three) times daily. 07/11/24  Yes Fernand Fredy RAMAN, MD  pantoprazole  (PROTONIX ) 40 MG tablet Take 1 tablet (40 mg total) by mouth daily. 01/04/24  Yes Fernand Fredy RAMAN, MD  sertraline  (ZOLOFT ) 50 MG tablet Take 1 tablet (50 mg total) by mouth daily. Patient taking differently: Take 25 mg by mouth daily. 05/22/24 05/22/25 Yes Fernand Fredy RAMAN, MD    Allergies as of 08/22/2024 - Review Complete 07/11/2024  Allergen Reaction Noted   Acetaminophen   03/24/2021   Codeine Nausea Only 07/28/2015   Hydrocodone -acetaminophen  Other (See Comments) 07/10/2018   Oxycodone hcl  03/24/2021   Oxycodone-acetaminophen   07/28/2015  Prednisolone  07/28/2015   Statins  02/06/2023    Family History  Problem Relation Age of Onset   Heart disease Mother    Alcohol abuse Father    Diabetes Daughter    Breast cancer Maternal Aunt    Cancer Brother    Cancer Maternal Grandmother    Cancer Maternal Grandfather     Social History   Socioeconomic History   Marital status: Divorced    Spouse name: Not on file   Number of children: Not on file   Years of education: Not on file   Highest education level: Not on file  Occupational History   Not on file  Tobacco Use   Smoking status: Former    Current  packs/day: 0.00    Types: Cigarettes    Quit date: 01/26/1992    Years since quitting: 32.7   Smokeless tobacco: Never   Tobacco comments:    Started smoking in the mid 80's    Smoked 0.25 PPD at the heaviest.      Vaping Use   Vaping status: Never Used  Substance and Sexual Activity   Alcohol use: No    Alcohol/week: 0.0 standard drinks of alcohol   Drug use: No   Sexual activity: Never  Other Topics Concern   Not on file  Social History Narrative   Not on file   Social Drivers of Health   Financial Resource Strain: Low Risk  (01/01/2024)   Received from [facility]   Overall Financial Resource Strain (CARDIA)    Difficulty of Paying Living Expenses: Not hard at all  Food Insecurity: No Food Insecurity (01/01/2024)   Received from [facility]   Hunger Vital Sign    Within the past 12 months, you worried that your food would run out before you got the money to buy more.: Never true    Within the past 12 months, the food you bought just didn't last and you didn't have money to get more.: Never true  Transportation Needs: No Transportation Needs (01/01/2024)   Received from [facility]    In the past 12 months, has lack of transportation kept you from medical appointments or from getting medications?: No    Lack of Transportation (Non-Medical): No  Physical Activity: Not on file  Stress: Not on file  Social Connections: Unknown (05/09/2022)   Received from [facility]   Social Network    Social Network: Not on file  Intimate Partner Violence: Unknown (03/31/2022)   Received from [facility]   HITS    Physically Hurt: Not on file    Insult or Talk Down To: Not on file    Threaten Physical Harm: Not on file    Scream or Curse: Not on file    Review of Systems: See HPI, otherwise negative ROS  Physical Exam: BP (!) 178/57   Pulse 74   Temp 98.6 F (37 C) (Temporal)   Resp 11  General:    Alert, cooperative. Head:  Normocephalic and atraumatic. Respiratory:  Normal work of breathing. Cardiovascular:  NAD  Impression/Plan: Taylor Barry is here for cataract surgery.  Risks, benefits, limitations, and alternatives regarding cataract surgery have been reviewed with the patient.  Questions have been answered.  All parties agreeable.   Adine Novak, MD  10/06/2024, 7:16 AM

## 2024-10-13 ENCOUNTER — Ambulatory Visit: Admitting: Internal Medicine

## 2024-10-13 ENCOUNTER — Telehealth: Payer: Self-pay

## 2024-10-13 NOTE — Telephone Encounter (Signed)
 Patient called asking about if she needs to be fasting for her labs and the answer is yes for her lipid panel to be drawn she needs to be fasting

## 2024-10-14 NOTE — Telephone Encounter (Signed)
Pt informed

## 2024-10-15 ENCOUNTER — Other Ambulatory Visit

## 2024-10-15 DIAGNOSIS — E782 Mixed hyperlipidemia: Secondary | ICD-10-CM | POA: Diagnosis not present

## 2024-10-15 DIAGNOSIS — K219 Gastro-esophageal reflux disease without esophagitis: Secondary | ICD-10-CM

## 2024-10-15 DIAGNOSIS — I152 Hypertension secondary to endocrine disorders: Secondary | ICD-10-CM | POA: Diagnosis not present

## 2024-10-15 DIAGNOSIS — E1165 Type 2 diabetes mellitus with hyperglycemia: Secondary | ICD-10-CM | POA: Diagnosis not present

## 2024-10-15 DIAGNOSIS — E1159 Type 2 diabetes mellitus with other circulatory complications: Secondary | ICD-10-CM | POA: Diagnosis not present

## 2024-10-15 DIAGNOSIS — E1169 Type 2 diabetes mellitus with other specified complication: Secondary | ICD-10-CM

## 2024-10-16 ENCOUNTER — Ambulatory Visit: Payer: Self-pay | Admitting: Internal Medicine

## 2024-10-16 ENCOUNTER — Encounter: Payer: Self-pay | Admitting: Internal Medicine

## 2024-10-16 ENCOUNTER — Ambulatory Visit (INDEPENDENT_AMBULATORY_CARE_PROVIDER_SITE_OTHER): Admitting: Internal Medicine

## 2024-10-16 VITALS — BP 130/78 | HR 77 | Ht 64.0 in | Wt 246.0 lb

## 2024-10-16 DIAGNOSIS — F322 Major depressive disorder, single episode, severe without psychotic features: Secondary | ICD-10-CM | POA: Diagnosis not present

## 2024-10-16 DIAGNOSIS — E1159 Type 2 diabetes mellitus with other circulatory complications: Secondary | ICD-10-CM

## 2024-10-16 DIAGNOSIS — E1165 Type 2 diabetes mellitus with hyperglycemia: Secondary | ICD-10-CM | POA: Diagnosis not present

## 2024-10-16 DIAGNOSIS — E782 Mixed hyperlipidemia: Secondary | ICD-10-CM | POA: Diagnosis not present

## 2024-10-16 DIAGNOSIS — M158 Other polyosteoarthritis: Secondary | ICD-10-CM | POA: Diagnosis not present

## 2024-10-16 DIAGNOSIS — I152 Hypertension secondary to endocrine disorders: Secondary | ICD-10-CM

## 2024-10-16 DIAGNOSIS — E1169 Type 2 diabetes mellitus with other specified complication: Secondary | ICD-10-CM

## 2024-10-16 DIAGNOSIS — Z23 Encounter for immunization: Secondary | ICD-10-CM | POA: Diagnosis not present

## 2024-10-16 LAB — CBC WITH DIFFERENTIAL/PLATELET
Basophils Absolute: 0 10*3/uL (ref 0.0–0.2)
Basos: 1 %
EOS (ABSOLUTE): 0.2 10*3/uL (ref 0.0–0.4)
Eos: 3 %
Hematocrit: 37.2 % (ref 34.0–46.6)
Hemoglobin: 12.2 g/dL (ref 11.1–15.9)
Immature Grans (Abs): 0 10*3/uL (ref 0.0–0.1)
Immature Granulocytes: 0 %
Lymphocytes Absolute: 1.8 10*3/uL (ref 0.7–3.1)
Lymphs: 36 %
MCH: 32.9 pg (ref 26.6–33.0)
MCHC: 32.8 g/dL (ref 31.5–35.7)
MCV: 100 fL — ABNORMAL HIGH (ref 79–97)
Monocytes Absolute: 0.3 10*3/uL (ref 0.1–0.9)
Monocytes: 6 %
Neutrophils Absolute: 2.7 10*3/uL (ref 1.4–7.0)
Neutrophils: 54 %
Platelets: 262 10*3/uL (ref 150–450)
RBC: 3.71 x10E6/uL — ABNORMAL LOW (ref 3.77–5.28)
RDW: 12.8 % (ref 11.7–15.4)
WBC: 5 10*3/uL (ref 3.4–10.8)

## 2024-10-16 LAB — HEMOGLOBIN A1C
Est. average glucose Bld gHb Est-mCnc: 131 mg/dL
Hgb A1c MFr Bld: 6.2 % — ABNORMAL HIGH (ref 4.8–5.6)

## 2024-10-16 LAB — LIPID PANEL W/O CHOL/HDL RATIO
Cholesterol, Total: 165 mg/dL (ref 100–199)
HDL: 19 mg/dL — ABNORMAL LOW
LDL Chol Calc (NIH): 98 mg/dL (ref 0–99)
Triglycerides: 278 mg/dL — ABNORMAL HIGH (ref 0–149)
VLDL Cholesterol Cal: 48 mg/dL — ABNORMAL HIGH (ref 5–40)

## 2024-10-16 LAB — CMP14+EGFR
ALT: 13 IU/L (ref 0–32)
AST: 25 IU/L (ref 0–40)
Albumin: 4.5 g/dL (ref 3.8–4.8)
Alkaline Phosphatase: 70 IU/L (ref 49–135)
BUN/Creatinine Ratio: 17 (ref 12–28)
BUN: 18 mg/dL (ref 8–27)
Bilirubin Total: 0.6 mg/dL (ref 0.0–1.2)
Calcium: 10.5 mg/dL — ABNORMAL HIGH (ref 8.7–10.3)
Chloride: 104 mmol/L (ref 96–106)
Creatinine, Ser: 1.07 mg/dL — ABNORMAL HIGH (ref 0.57–1.00)
Globulin, Total: 2.7 g/dL (ref 1.5–4.5)
Glucose: 94 mg/dL (ref 70–99)
Potassium: 5.3 mmol/L — ABNORMAL HIGH (ref 3.5–5.2)
Sodium: 139 mmol/L (ref 134–144)
Total Protein: 7.2 g/dL (ref 6.0–8.5)
eGFR: 53 mL/min/1.73 — ABNORMAL LOW (ref >59)

## 2024-10-16 LAB — POCT CBG (FASTING - GLUCOSE)-MANUAL ENTRY: Glucose Fasting, POC: 123 mg/dL — AB (ref 70–99)

## 2024-10-16 NOTE — Progress Notes (Signed)
 Established Patient Office Visit  Subjective:  Patient ID: Taylor Barry, female    DOB: [birth year]  Age: 78 y.o. MRN: [id number]  Chief Complaint  Patient presents with   Follow-up    3 month follow up    Patient is here today for follow up. She reports she is doing well and has no new complaints. She denies headache, chest pain, shortness of breath, palpitations.   She had recent CT chest from Pulmonology that showed  Unchanged moderate pulmonary fibrosis in a pattern with apical to basal gradient predominantly featuring irregular ground-glass and septal thickening with some suggestion of subpleural sparing, as well as traction bronchiectasis in the bilateral lung bases without clear evidence of subpleural bronchiolectasis or honeycombing. Her Pulmonologist recommended her to consult with Rheumatologist to determine if Pulmonary fibrosis could be related to RA. Patient declined wanting to see anymore specialists. ANA positive, RA elevated 04/2024. Will recheck RA today.  Patient has left cataract surgery 10/06/24 and reports vision improvement without glasses. She is following up closely with her ophthalmologist.   Patient reports she did not increase glipizide  to 2.5 mg three times a day. Her latest HbgA1c improved to 6.2% Patient recommended to continue twice daily administration at this time. She states fasting blood glucose levels range from 100-160s at home.  Patient is currently on Zoloft  50 mg daily. She reports continued difficulty with her depression and recommended patient to increase dose to 75 mg daily. Her PHQ-9 score today was 19; GAD-7 score today was 10.  Labs discussed in detail today. Patient would like a flu shot today    No other concerns at this time.   Past Medical History:  Diagnosis Date   Allergic rhinitis with postnasal drip    Anxiety and depression    Aortic atherosclerosis    Arthritis    Arthritis, multiple joint involvement    CAD (coronary artery  disease)    Complication of anesthesia    has trouble waking up   Dyspnea on exertion    GERD (gastroesophageal reflux disease)    Hair loss    HTN, goal below 150/90    Hyperlipidemia LDL goal <100    Hypertension associated with diabetes (HCC)    Hypoxemia    Idiopathic pulmonary fibrosis (HCC)    Interstitial lung disease (HCC)    Interstitial lung disease (HCC)    Morbid obesity (HCC)    Obstructive sleep apnea, adult    CPAP   OSA on CPAP    Post herpetic neuralgia    Pulmonary arterial hypertension (HCC)    Right adrenal mass    Type 2 diabetes mellitus (HCC)    Vitamin D deficiency     Past Surgical History:  Procedure Laterality Date   ABDOMINAL HYSTERECTOMY  1968   total   CATARACT EXTRACTION W/PHACO Right 09/22/2024   Procedure: PHACOEMULSIFICATION, CATARACT, WITH IOL INSERTION 7.30 00:45.3;  Surgeon: Myrna Adine Anes, MD;  Location: [facility];  Service: Ophthalmology;  Laterality: Right;   CATARACT EXTRACTION W/PHACO Left 10/06/2024   Procedure: PHACOEMULSIFICATION, CATARACT, WITH IOL INSERTION 4.65 00:36.5;  Surgeon: Myrna Adine Anes, MD;  Location: [facility];  Service: Ophthalmology;  Laterality: Left;   COLONOSCOPY WITH PROPOFOL  N/A 04/15/2021   Procedure: COLONOSCOPY WITH PROPOFOL ;  Surgeon: Jinny Carmine, MD;  Location: [facility];  Service: Endoscopy;  Laterality: N/A;  [facility] - oral meds priority 4   REPLACEMENT TOTAL KNEE Bilateral    Right knee first, Left knee more  recent   TYMPANOPLASTY Right 1977    Social History   Socioeconomic History   Marital status: Divorced    Spouse name: Not on file   Number of children: Not on file   Years of education: Not on file   Highest education level: Not on file  Occupational History   Not on file  Tobacco Use   Smoking status: Former    Current packs/day: 0.00    Types: Cigarettes    Quit date: 01/26/1992    Years since quitting: 32.7   Smokeless tobacco: Never   Tobacco  comments:    Started smoking in the mid 80's    Smoked 0.25 PPD at the heaviest.      Vaping Use   Vaping status: Never Used  Substance and Sexual Activity   Alcohol use: No    Alcohol/week: 0.0 standard drinks of alcohol   Drug use: No   Sexual activity: Never  Other Topics Concern   Not on file  Social History Narrative   Not on file   Social Drivers of Health   Financial Resource Strain: Low Risk  (01/01/2024)   Received from [facility]   Overall Financial Resource Strain (CARDIA)    Difficulty of Paying Living Expenses: Not hard at all  Food Insecurity: No Food Insecurity (01/01/2024)   Received from [facility]   Hunger Vital Sign    Within the past 12 months, you worried that your food would run out before you got the money to buy more.: Never true    Within the past 12 months, the food you bought just didn't last and you didn't have money to get more.: Never true  Transportation Needs: No Transportation Needs (01/01/2024)   Received from [facility] - Transportation    In the past 12 months, has lack of transportation kept you from medical appointments or from getting medications?: No    Lack of Transportation (Non-Medical): No  Physical Activity: Not on file  Stress: Not on file  Social Connections: Unknown (05/09/2022)   Received from [facility]   Social Network    Social Network: Not on file  Intimate Partner Violence: Unknown (03/31/2022)   Received from [facility]   HITS    Physically Hurt: Not on file    Insult or Talk Down To: Not on file    Threaten Physical Harm: Not on file    Scream or Curse: Not on file    Family History  Problem Relation Age of Onset   Heart disease Mother    Alcohol abuse Father    Diabetes Daughter    Breast cancer Maternal Aunt    Cancer Brother    Cancer Maternal Grandmother    Cancer Maternal Grandfather     Allergies  Allergen Reactions   Codeine Nausea  Only   Hydrocodone -Acetaminophen  Other (See Comments)    Puts me up the wall   Oxycodone Hcl    Oxycodone-Acetaminophen      Hallucination   Prednisolone     Hallucination   Statins     Outpatient Medications Prior to Visit  Medication Sig   Bempedoic Acid -Ezetimibe (NEXLIZET ) 180-10 MG TABS Take 1 tablet by mouth daily.   clotrimazole -betamethasone  (LOTRISONE ) cream Apply 1 Application topically 2 (two) times daily.   fenofibrate  (TRICOR ) 145 MG tablet Take 1 tablet (145 mg total) by mouth daily.   glipiZIDE  (GLUCOTROL ) 5 MG tablet Take 0.5 tablets (2.5 mg total)  by mouth in the morning, at noon, and at bedtime.   glucose blood (ACCU-CHEK GUIDE TEST) test strip 1 each by Other route in the morning, at noon, and at bedtime. Use as instructed   losartan -hydrochlorothiazide  (HYZAAR) 100-12.5 MG tablet TAKE 1 TABLET BY MOUTH ONCE  DAILY   nystatin  powder Apply 1 Application topically 3 (three) times daily.   sertraline  (ZOLOFT ) 50 MG tablet Take 1 tablet (50 mg total) by mouth daily. (Patient taking differently: Take 25 mg by mouth daily.)   pantoprazole  (PROTONIX ) 40 MG tablet Take 1 tablet (40 mg total) by mouth daily.   No facility-administered medications prior to visit.    Review of Systems  Constitutional: Negative.  Negative for chills, fever and malaise/fatigue.  HENT: Negative.  Negative for congestion and sore throat.   Eyes: Negative.  Negative for blurred vision and pain.  Respiratory: Negative.  Negative for cough and shortness of breath.   Cardiovascular: Negative.  Negative for chest pain, palpitations and leg swelling.  Gastrointestinal: Negative.  Negative for abdominal pain, blood in stool, constipation, diarrhea, heartburn, melena, nausea and vomiting.  Genitourinary: Negative.  Negative for dysuria, flank pain, frequency and urgency.  Musculoskeletal: Negative.  Negative for joint pain and myalgias.  Skin: Negative.   Neurological: Negative.  Negative for  dizziness, tingling, sensory change, weakness and headaches.  Endo/Heme/Allergies: Negative.   Psychiatric/Behavioral:  Positive for depression. Negative for suicidal ideas. The patient is not nervous/anxious.        Objective:   BP 130/78   Pulse 77   Ht 5' 4 (1.626 m)   Wt 246 lb (111.6 kg)   SpO2 95%   BMI 42.23 kg/m   Vitals:   10/16/24 1325  BP: 130/78  Pulse: 77  Height: 5' 4 (1.626 m)  Weight: 246 lb (111.6 kg)  SpO2: 95%  BMI (Calculated): 42.21    Physical Exam Vitals and nursing note reviewed.  Constitutional:      Appearance: Normal appearance.  HENT:     Head: Normocephalic and atraumatic.     Nose: Nose normal.     Mouth/Throat:     Mouth: Mucous membranes are moist.     Pharynx: Oropharynx is clear.  Eyes:     Conjunctiva/sclera: Conjunctivae normal.     Pupils: Pupils are equal, round, and reactive to light.  Cardiovascular:     Rate and Rhythm: Normal rate and regular rhythm.     Pulses: Normal pulses.     Heart sounds: Normal heart sounds. No murmur heard. Pulmonary:     Effort: Pulmonary effort is normal.     Breath sounds: Normal breath sounds. No wheezing.  Abdominal:     General: Bowel sounds are normal.     Palpations: Abdomen is soft.     Tenderness: There is no abdominal tenderness. There is no right CVA tenderness or left CVA tenderness.  Musculoskeletal:        General: Normal range of motion.     Cervical back: Normal range of motion.     Right lower leg: No edema.     Left lower leg: No edema.  Skin:    General: Skin is warm and dry.  Neurological:     General: No focal deficit present.     Mental Status: She is alert and oriented to person, place, and time.  Psychiatric:        Mood and Affect: Mood normal.        Behavior: Behavior normal.  Results for orders placed or performed in visit on 10/16/24  POCT CBG (Fasting - Glucose)  Result Value Ref Range   Glucose Fasting, POC 123 (A) 70 - 99 mg/dL    Recent  Results (from the past 2160 hours)  Glucose, capillary     Status: Abnormal   Collection Time: 09/22/24  8:51 AM  Result Value Ref Range   Glucose-Capillary 160 (H) 70 - 99 mg/dL    Comment: Glucose reference range applies only to samples taken after fasting for at least 8 hours.   Comment 1 Notify RN   Glucose, capillary     Status: Abnormal   Collection Time: 10/06/24  6:41 AM  Result Value Ref Range   Glucose-Capillary 168 (H) 70 - 99 mg/dL    Comment: Glucose reference range applies only to samples taken after fasting for at least 8 hours.  Hemoglobin A1c     Status: Abnormal   Collection Time: 10/15/24  8:47 AM  Result Value Ref Range   Hgb A1c MFr Bld 6.2 (H) 4.8 - 5.6 %    Comment:          Prediabetes: 5.7 - 6.4          Diabetes: >6.4          Glycemic control for adults with diabetes: <7.0    Est. average glucose Bld gHb Est-mCnc 131 mg/dL  Lipid Panel w/o Chol/HDL Ratio     Status: Abnormal   Collection Time: 10/15/24  8:47 AM  Result Value Ref Range   Cholesterol, Total 165 100 - 199 mg/dL   Triglycerides 721 (H) 0 - 149 mg/dL   HDL 19 (L) >60 mg/dL   VLDL Cholesterol Cal 48 (H) 5 - 40 mg/dL   LDL Chol Calc (NIH) 98 0 - 99 mg/dL  [id number]     Status: Abnormal   Collection Time: 10/15/24  8:47 AM  Result Value Ref Range   Glucose 94 70 - 99 mg/dL   BUN 18 8 - 27 mg/dL   Creatinine, Ser 8.92 (H) 0.57 - 1.00 mg/dL   eGFR 53 (L) >40 fO/fpw/8.26   BUN/Creatinine Ratio 17 12 - 28   Sodium 139 134 - 144 mmol/L   Potassium 5.3 (H) 3.5 - 5.2 mmol/L   Chloride 104 96 - 106 mmol/L   CO2 CANCELED mmol/L    Comment: [facility] was unable to collect sufficient specimen to perform the following test(s), and is providing the patient with re-collection instructions.  Result canceled by the ancillary.    Calcium 10.5 (H) 8.7 - 10.3 mg/dL   Total Protein 7.2 6.0 - 8.5 g/dL   Albumin 4.5 3.8 - 4.8 g/dL   Globulin, Total 2.7 1.5 - 4.5 g/dL   Bilirubin Total 0.6 0.0 - 1.2  mg/dL   Alkaline Phosphatase 70 49 - 135 IU/L   AST 25 0 - 40 IU/L   ALT 13 0 - 32 IU/L  CBC with Diff     Status: Abnormal   Collection Time: 10/15/24  8:47 AM  Result Value Ref Range   WBC 5.0 3.4 - 10.8 x10E3/uL   RBC 3.71 (L) 3.77 - 5.28 x10E6/uL   Hemoglobin 12.2 11.1 - 15.9 g/dL   Hematocrit 62.7 65.9 - 46.6 %   MCV 100 (H) 79 - 97 fL   MCH 32.9 26.6 - 33.0 pg   MCHC 32.8 31.5 - 35.7 g/dL   RDW 87.1 88.2 - 84.5 %   Platelets 262 150 - 450  x10E3/uL   Neutrophils 54 Not Estab. %   Lymphs 36 Not Estab. %   Monocytes 6 Not Estab. %   Eos 3 Not Estab. %   Basos 1 Not Estab. %   Neutrophils Absolute 2.7 1.4 - 7.0 x10E3/uL   Lymphocytes Absolute 1.8 0.7 - 3.1 x10E3/uL   Monocytes Absolute 0.3 0.1 - 0.9 x10E3/uL   EOS (ABSOLUTE) 0.2 0.0 - 0.4 x10E3/uL   Basophils Absolute 0.0 0.0 - 0.2 x10E3/uL   Immature Granulocytes 0 Not Estab. %   Immature Grans (Abs) 0.0 0.0 - 0.1 x10E3/uL  POCT CBG (Fasting - Glucose)     Status: Abnormal   Collection Time: 10/16/24  1:34 PM  Result Value Ref Range   Glucose Fasting, POC 123 (A) 70 - 99 mg/dL      Assessment & Plan:  Increase Zoloft  to 75 mg daily. Check labs today and FU with patient on results. Flu shot given. Problem List Items Addressed This Visit     Osteoarthritis of multiple joints   Relevant Orders   Rheumatoid (RA) Factor   CRP (C-Reactive Protein)   CYCLIC CITRUL PEPTIDE ANTIBODY, IGG/IGA   Sed Rate (ESR)   Hypertension associated with diabetes (HCC)   Major depressive disorder, single episode, severe (HCC)   Type 2 diabetes mellitus with hyperglycemia, without long-term current use of insulin  (HCC) - Primary   Relevant Orders   POCT CBG (Fasting - Glucose) (Completed)   Combined hyperlipidemia associated with type 2 diabetes mellitus (HCC)   Needs flu shot   Relevant Orders   Flu Vaccine Trivalent High Dose (Fluad)    Return in about 3 weeks (around 11/06/2024).   Total time spent: 25 minutes. This time  includes review of previous notes and results and patient face to face interaction during today's visit.    FERNAND FREDY RAMAN, MD  10/16/2024   This document may have been prepared by [facility] Voice Recognition software and as such may include unintentional dictation errors.

## 2024-10-17 ENCOUNTER — Ambulatory Visit: Admitting: Internal Medicine

## 2024-10-20 LAB — C-REACTIVE PROTEIN: CRP: 1 mg/L (ref 0–10)

## 2024-10-20 LAB — SEDIMENTATION RATE: Sed Rate: 23 mm/h (ref 0–40)

## 2024-10-20 LAB — CYCLIC CITRUL PEPTIDE ANTIBODY, IGG/IGA: Cyclic Citrullin Peptide Ab: 7 U (ref 0–19)

## 2024-10-20 LAB — RHEUMATOID FACTOR: Rheumatoid fact SerPl-aCnc: 10.6 [IU]/mL (ref <14.0)

## 2024-10-21 ENCOUNTER — Ambulatory Visit: Payer: Self-pay | Admitting: Internal Medicine

## 2024-10-21 DIAGNOSIS — R7689 Other specified abnormal immunological findings in serum: Secondary | ICD-10-CM

## 2024-10-21 NOTE — Progress Notes (Signed)
 Patient notified

## 2024-11-07 ENCOUNTER — Ambulatory Visit: Admitting: Internal Medicine

## 2024-11-11 ENCOUNTER — Other Ambulatory Visit: Payer: Self-pay | Admitting: Internal Medicine

## 2024-11-11 DIAGNOSIS — E1165 Type 2 diabetes mellitus with hyperglycemia: Secondary | ICD-10-CM

## 2024-12-12 ENCOUNTER — Encounter: Payer: Self-pay | Admitting: Internal Medicine

## 2024-12-12 ENCOUNTER — Ambulatory Visit: Admitting: Internal Medicine

## 2024-12-12 VITALS — BP 140/90 | HR 75 | Ht 64.0 in | Wt 250.0 lb

## 2024-12-12 DIAGNOSIS — I152 Hypertension secondary to endocrine disorders: Secondary | ICD-10-CM

## 2024-12-12 DIAGNOSIS — L509 Urticaria, unspecified: Secondary | ICD-10-CM | POA: Diagnosis not present

## 2024-12-12 DIAGNOSIS — F322 Major depressive disorder, single episode, severe without psychotic features: Secondary | ICD-10-CM

## 2024-12-12 DIAGNOSIS — G4733 Obstructive sleep apnea (adult) (pediatric): Secondary | ICD-10-CM

## 2024-12-12 DIAGNOSIS — F411 Generalized anxiety disorder: Secondary | ICD-10-CM

## 2024-12-12 DIAGNOSIS — E1142 Type 2 diabetes mellitus with diabetic polyneuropathy: Secondary | ICD-10-CM

## 2024-12-12 DIAGNOSIS — E782 Mixed hyperlipidemia: Secondary | ICD-10-CM | POA: Diagnosis not present

## 2024-12-12 DIAGNOSIS — E1165 Type 2 diabetes mellitus with hyperglycemia: Secondary | ICD-10-CM | POA: Diagnosis not present

## 2024-12-12 DIAGNOSIS — E1169 Type 2 diabetes mellitus with other specified complication: Secondary | ICD-10-CM

## 2024-12-12 DIAGNOSIS — E1159 Type 2 diabetes mellitus with other circulatory complications: Secondary | ICD-10-CM

## 2024-12-12 LAB — POCT CBG (FASTING - GLUCOSE)-MANUAL ENTRY: Glucose Fasting, POC: 100 mg/dL — AB (ref 70–99)

## 2024-12-12 MED ORDER — CETIRIZINE HCL 10 MG PO TABS: 10.0000 mg | ORAL_TABLET | Freq: Every day | ORAL | 2 refills | Status: AC

## 2024-12-12 NOTE — Progress Notes (Signed)
 "  Established Patient Office Visit  Subjective:  Patient ID: Taylor Barry, female    DOB: 07-Jun-1946  Age: 78 y.o. MRN: [id number]  Chief Complaint  Patient presents with   Follow-up    2 month follow up    Patient comes in for follow up today. She is generally feeling well except for difficulty with her dentures.  Currently her upper dentures are in place, causing difficulties with chewing so she eats soft food, and has actually gained more weight. Patient denies any shortness of breath, chest tightness or chest pain.  Her inflammatory markers were negative.  She was advised to see a rheumatologist by her pulmonologist, but she has declined.  Will repeat her CT chest in 3 more months. Currently she is taking Zoloft  25 mg/day, and thinks it is enough and she is feeling no more depression or anxiety on this small dose. Her blood pressure is somewhat high today, we will continue to monitor. Mentions intermittent itching in different parts of her body, no definite rash ,advised to use Zyrtec at bedtime as needed.  She has multiple seborrheic keratosis, has an appointment to see her dermatologist. Patient will return in 1 month for blood work and AWV.    No other concerns at this time.   Past Medical History:  Diagnosis Date   Allergic rhinitis with postnasal drip    Anxiety and depression    Aortic atherosclerosis    Arthritis    Arthritis, multiple joint involvement    CAD (coronary artery disease)    Complication of anesthesia    has trouble waking up   Dyspnea on exertion    GERD (gastroesophageal reflux disease)    Hair loss    HTN, goal below 150/90    Hyperlipidemia LDL goal <100    Hypertension associated with diabetes (HCC)    Hypoxemia    Idiopathic pulmonary fibrosis (HCC)    Interstitial lung disease (HCC)    Interstitial lung disease (HCC)    Morbid obesity (HCC)    Obstructive sleep apnea, adult    CPAP   OSA on CPAP    Post herpetic neuralgia    Pulmonary  arterial hypertension (HCC)    Right adrenal mass    Type 2 diabetes mellitus (HCC)    Vitamin D deficiency     Past Surgical History:  Procedure Laterality Date   ABDOMINAL HYSTERECTOMY  1968   total   CATARACT EXTRACTION W/PHACO Right 09/22/2024   Procedure: PHACOEMULSIFICATION, CATARACT, WITH IOL INSERTION 7.30 00:45.3;  Surgeon: Myrna Adine Anes, MD;  Location: [facility];  Service: Ophthalmology;  Laterality: Right;   CATARACT EXTRACTION W/PHACO Left 10/06/2024   Procedure: PHACOEMULSIFICATION, CATARACT, WITH IOL INSERTION 4.65 00:36.5;  Surgeon: Myrna Adine Anes, MD;  Location: [facility];  Service: Ophthalmology;  Laterality: Left;   COLONOSCOPY WITH PROPOFOL  N/A 04/15/2021   Procedure: COLONOSCOPY WITH PROPOFOL ;  Surgeon: Jinny Carmine, MD;  Location: [facility];  Service: Endoscopy;  Laterality: N/A;  [facility] - oral meds priority 4   REPLACEMENT TOTAL KNEE Bilateral    Right knee first, Left knee more recent   TYMPANOPLASTY Right 1977    Social History   Socioeconomic History   Marital status: Divorced    Spouse name: Not on file   Number of children: Not on file   Years of education: Not on file   Highest education level: Not on file  Occupational History   Not on file  Tobacco Use  Smoking status: Former    Current packs/day: 0.00    Average packs/day: 0.3 packs/day    Types: Cigarettes    Quit date: 01/26/1992    Years since quitting: 32.9   Smokeless tobacco: Never   Tobacco comments:    Started smoking in the mid 80's    Smoked 0.25 PPD at the heaviest.      Vaping Use   Vaping status: Never Used  Substance and Sexual Activity   Alcohol use: No    Alcohol/week: 0.0 standard drinks of alcohol   Drug use: No   Sexual activity: Never  Other Topics Concern   Not on file  Social History Narrative   Not on file   Social Drivers of Health   Tobacco Use: Medium Risk (12/12/2024)   Patient History    Smoking Tobacco Use:  Former    Smokeless Tobacco Use: Never    Passive Exposure: Not on Actuary Strain: Low Risk  (01/01/2024)   Received from [facility]   Overall Financial Resource Strain (CARDIA)    Difficulty of Paying Living Expenses: Not hard at all  Food Insecurity: No Food Insecurity (01/01/2024)   Received from [facility]   Epic    Within the past 12 months, you worried that your food would run out before you got the money to buy more.: Never true    Within the past 12 months, the food you bought just didn't last and you didn't have money to get more.: Never true  Transportation Needs: No Transportation Needs (01/01/2024)   Received from [facility] - Transportation    In the past 12 months, has lack of transportation kept you from medical appointments or from getting medications?: No    Lack of Transportation (Non-Medical): No  Physical Activity: Not on file  Stress: Not on file  Social Connections: Unknown (05/09/2022)   Received from [facility]   Social Network    Social Network: Not on file  Intimate Partner Violence: Unknown (03/31/2022)   Received from [facility]   HITS    Physically Hurt: Not on file    Insult or Talk Down To: Not on file    Threaten Physical Harm: Not on file    Scream or Curse: Not on file  Depression (PHQ2-9): High Risk (10/16/2024)   Depression (PHQ2-9)    PHQ-2 Score: 19  Alcohol Screen: Not on file  Housing: Low Risk  (01/01/2024)   Received from [facility]   Epic    In the last 12 months, was there a time when you were not able to pay the mortgage or rent on time?: No    In the past 12 months, how many times have you moved where you were living?: 0    At any time in the past 12 months, were you homeless or living in a shelter (including now)?: No  Utilities: Not At Risk (01/01/2024)   Received from [facility] Utilities    Threatened with  loss of utilities: No  Health Literacy: Not on file    Family History  Problem Relation Age of Onset   Heart disease Mother    Alcohol abuse Father    Diabetes Daughter    Breast cancer Maternal Aunt    Cancer Brother    Cancer Maternal Grandmother    Cancer Maternal Grandfather     Allergies[1]  Show/hide medication  list[2]  Review of Systems  Constitutional: Negative.  Negative for chills, fever and malaise/fatigue.  HENT: Negative.  Negative for congestion and sore throat.   Eyes: Negative.  Negative for blurred vision and pain.  Respiratory: Negative.  Negative for cough and shortness of breath.   Cardiovascular: Negative.  Negative for chest pain, palpitations and leg swelling.  Gastrointestinal: Negative.  Negative for abdominal pain, blood in stool, constipation, diarrhea, heartburn, melena, nausea and vomiting.  Genitourinary: Negative.  Negative for dysuria, flank pain, frequency and urgency.  Musculoskeletal: Negative.  Negative for joint pain and myalgias.  Skin: Negative.   Neurological: Negative.  Negative for dizziness, tingling, sensory change, weakness and headaches.  Endo/Heme/Allergies: Negative.   Psychiatric/Behavioral: Negative.  Negative for depression and suicidal ideas. The patient is not nervous/anxious.        Objective:   BP (!) 140/90   Pulse 75   Ht 5' 4 (1.626 m)   Wt 250 lb (113.4 kg)   SpO2 98%   BMI 42.91 kg/m   Vitals:   12/12/24 1423  BP: (!) 140/90  Pulse: 75  Height: 5' 4 (1.626 m)  Weight: 250 lb (113.4 kg)  SpO2: 98%  BMI (Calculated): 42.89    Physical Exam Vitals and nursing note reviewed.  Constitutional:      Appearance: Normal appearance.  HENT:     Head: Normocephalic and atraumatic.     Nose: Nose normal.     Mouth/Throat:     Mouth: Mucous membranes are moist.     Pharynx: Oropharynx is clear.  Eyes:     Conjunctiva/sclera: Conjunctivae normal.     Pupils: Pupils are equal, round, and reactive to  light.  Cardiovascular:     Rate and Rhythm: Normal rate and regular rhythm.     Pulses: Normal pulses.     Heart sounds: Normal heart sounds. No murmur heard. Pulmonary:     Effort: Pulmonary effort is normal.     Breath sounds: Normal breath sounds. No wheezing.  Abdominal:     General: Bowel sounds are normal.     Palpations: Abdomen is soft.     Tenderness: There is no abdominal tenderness. There is no right CVA tenderness or left CVA tenderness.  Musculoskeletal:        General: Normal range of motion.     Cervical back: Normal range of motion.     Right lower leg: No edema.     Left lower leg: No edema.  Skin:    General: Skin is warm and dry.  Neurological:     General: No focal deficit present.     Mental Status: She is alert and oriented to person, place, and time.  Psychiatric:        Mood and Affect: Mood normal.        Behavior: Behavior normal.      Results for orders placed or performed in visit on 12/12/24  POCT CBG (Fasting - Glucose)  Result Value Ref Range   Glucose Fasting, POC 100 (A) 70 - 99 mg/dL    Recent Results (from the past 2160 hours)  Glucose, capillary     Status: Abnormal   Collection Time: 09/22/24  8:51 AM  Result Value Ref Range   Glucose-Capillary 160 (H) 70 - 99 mg/dL    Comment: Glucose reference range applies only to samples taken after fasting for at least 8 hours.   Comment 1 Notify RN   Glucose, capillary     Status:  Abnormal   Collection Time: 10/06/24  6:41 AM  Result Value Ref Range   Glucose-Capillary 168 (H) 70 - 99 mg/dL    Comment: Glucose reference range applies only to samples taken after fasting for at least 8 hours.  Hemoglobin A1c     Status: Abnormal   Collection Time: 10/15/24  8:47 AM  Result Value Ref Range   Hgb A1c MFr Bld 6.2 (H) 4.8 - 5.6 %    Comment:          Prediabetes: 5.7 - 6.4          Diabetes: >6.4          Glycemic control for adults with diabetes: <7.0    Est. average glucose Bld gHb  Est-mCnc 131 mg/dL  Lipid Panel w/o Chol/HDL Ratio     Status: Abnormal   Collection Time: 10/15/24  8:47 AM  Result Value Ref Range   Cholesterol, Total 165 100 - 199 mg/dL   Triglycerides 721 (H) 0 - 149 mg/dL   HDL 19 (L) >60 mg/dL   VLDL Cholesterol Cal 48 (H) 5 - 40 mg/dL   LDL Chol Calc (NIH) 98 0 - 99 mg/dL  [id number]+ZHQM     Status: Abnormal   Collection Time: 10/15/24  8:47 AM  Result Value Ref Range   Glucose 94 70 - 99 mg/dL   BUN 18 8 - 27 mg/dL   Creatinine, Ser 8.92 (H) 0.57 - 1.00 mg/dL   eGFR 53 (L) >40 fO/fpw/8.26   BUN/Creatinine Ratio 17 12 - 28   Sodium 139 134 - 144 mmol/L   Potassium 5.3 (H) 3.5 - 5.2 mmol/L   Chloride 104 96 - 106 mmol/L   CO2 CANCELED mmol/L    Comment: [facility] was unable to collect sufficient specimen to perform the following test(s), and is providing the patient with re-collection instructions.  Result canceled by the ancillary.    Calcium 10.5 (H) 8.7 - 10.3 mg/dL   Total Protein 7.2 6.0 - 8.5 g/dL   Albumin 4.5 3.8 - 4.8 g/dL   Globulin, Total 2.7 1.5 - 4.5 g/dL   Bilirubin Total 0.6 0.0 - 1.2 mg/dL   Alkaline Phosphatase 70 49 - 135 IU/L   AST 25 0 - 40 IU/L   ALT 13 0 - 32 IU/L  CBC with Diff     Status: Abnormal   Collection Time: 10/15/24  8:47 AM  Result Value Ref Range   WBC 5.0 3.4 - 10.8 x10E3/uL   RBC 3.71 (L) 3.77 - 5.28 x10E6/uL   Hemoglobin 12.2 11.1 - 15.9 g/dL   Hematocrit 62.7 65.9 - 46.6 %   MCV 100 (H) 79 - 97 fL   MCH 32.9 26.6 - 33.0 pg   MCHC 32.8 31.5 - 35.7 g/dL   RDW 87.1 88.2 - 84.5 %   Platelets 262 150 - 450 x10E3/uL   Neutrophils 54 Not Estab. %   Lymphs 36 Not Estab. %   Monocytes 6 Not Estab. %   Eos 3 Not Estab. %   Basos 1 Not Estab. %   Neutrophils Absolute 2.7 1.4 - 7.0 x10E3/uL   Lymphocytes Absolute 1.8 0.7 - 3.1 x10E3/uL   Monocytes Absolute 0.3 0.1 - 0.9 x10E3/uL   EOS (ABSOLUTE) 0.2 0.0 - 0.4 x10E3/uL   Basophils Absolute 0.0 0.0 - 0.2 x10E3/uL   Immature Granulocytes 0 Not Estab.  %   Immature Grans (Abs) 0.0 0.0 - 0.1 x10E3/uL  POCT CBG (Fasting - Glucose)  Status: Abnormal   Collection Time: 10/16/24  1:34 PM  Result Value Ref Range   Glucose Fasting, POC 123 (A) 70 - 99 mg/dL  Rheumatoid (RA) Factor     Status: None   Collection Time: 10/16/24  2:20 PM  Result Value Ref Range   Rheumatoid fact SerPl-aCnc 10.6 <14.0 IU/mL  CRP (C-Reactive Protein)     Status: None   Collection Time: 10/16/24  2:20 PM  Result Value Ref Range   CRP 1 0 - 10 mg/L  CYCLIC CITRUL PEPTIDE ANTIBODY, IGG/IGA     Status: None   Collection Time: 10/16/24  2:20 PM  Result Value Ref Range   Cyclic Citrullin Peptide Ab 7 0 - 19 units    Comment:                           Negative               <20                           Weak positive      20 - 39                           Moderate positive  40 - 59                           Strong positive        >59   Sed Rate (ESR)     Status: None   Collection Time: 10/16/24  2:20 PM  Result Value Ref Range   Sed Rate 23 0 - 40 mm/hr  POCT CBG (Fasting - Glucose)     Status: Abnormal   Collection Time: 12/12/24  2:32 PM  Result Value Ref Range   Glucose Fasting, POC 100 (A) 70 - 99 mg/dL      Assessment & Plan:  Continue current medications.  Diet control emphasized.  Can take Zyrtec for urticaria.  Monitor blood pressure. Problem List Items Addressed This Visit       Cardiovascular and Mediastinum   Hypertension associated with diabetes (HCC) - Primary     Respiratory   Obstructive sleep apnea of adult     Endocrine   Type 2 diabetes mellitus with hyperglycemia, without long-term current use of insulin  (HCC)   Relevant Orders   POCT CBG (Fasting - Glucose) (Completed)   Diabetic peripheral neuropathy (HCC)   Relevant Medications   sertraline  (ZOLOFT ) 25 MG tablet   Combined hyperlipidemia associated with type 2 diabetes mellitus (HCC)     Other   Major depressive disorder, single episode, severe (HCC)   Relevant  Medications   sertraline  (ZOLOFT ) 25 MG tablet   GAD (generalized anxiety disorder)   Relevant Medications   sertraline  (ZOLOFT ) 25 MG tablet   Other Visit Diagnoses       Urticaria       Relevant Medications   cetirizine (ZYRTEC ALLERGY) 10 MG tablet       Follow up 1 month.  Total time spent: 30 minutes. This time includes review of previous notes and results and patient face to face interaction during today's visit.    FERNAND FREDY RAMAN, MD  12/12/2024   This document may have been prepared by [facility] Voice Recognition software and as such may include unintentional dictation errors.     [  1]  Allergies Allergen Reactions   Codeine Nausea Only   Hydrocodone -Acetaminophen  Other (See Comments)    Puts me up the wall   Oxycodone Hcl    Oxycodone-Acetaminophen      Hallucination   Prednisolone     Hallucination   Statins   [2]  Outpatient Medications Prior to Visit  Medication Sig   Bempedoic Acid -Ezetimibe (NEXLIZET ) 180-10 MG TABS Take 1 tablet by mouth daily.   clotrimazole -betamethasone  (LOTRISONE ) cream Apply 1 Application topically 2 (two) times daily.   fenofibrate  (TRICOR ) 145 MG tablet Take 1 tablet (145 mg total) by mouth daily.   glipiZIDE  (GLUCOTROL ) 5 MG tablet TAKE ONE-HALF TABLET BY MOUTH IN THE MORNING , AT NOON, AND AT  BEDTIME   glucose blood (ACCU-CHEK GUIDE TEST) test strip 1 each by Other route in the morning, at noon, and at bedtime. Use as instructed   losartan -hydrochlorothiazide  (HYZAAR) 100-12.5 MG tablet TAKE 1 TABLET BY MOUTH ONCE  DAILY   nystatin  powder Apply 1 Application topically 3 (three) times daily.   pantoprazole  (PROTONIX ) 40 MG tablet Take 1 tablet (40 mg total) by mouth daily. (Patient taking differently: Take 40 mg by mouth as needed.)   sertraline  (ZOLOFT ) 25 MG tablet Take 25 mg by mouth daily.   sertraline  (ZOLOFT ) 50 MG tablet Take 1 tablet (50 mg total) by mouth daily. (Patient not taking: Reported on 12/12/2024)   No  facility-administered medications prior to visit.   "

## 2024-12-21 ENCOUNTER — Other Ambulatory Visit: Payer: Self-pay | Admitting: Internal Medicine

## 2025-01-12 ENCOUNTER — Ambulatory Visit: Admitting: Internal Medicine

## 2025-01-12 ENCOUNTER — Encounter: Payer: Self-pay | Admitting: Internal Medicine

## 2025-01-12 VITALS — BP 128/80 | HR 82 | Ht 64.0 in | Wt 246.8 lb

## 2025-01-12 DIAGNOSIS — E782 Mixed hyperlipidemia: Secondary | ICD-10-CM | POA: Diagnosis not present

## 2025-01-12 DIAGNOSIS — E1159 Type 2 diabetes mellitus with other circulatory complications: Secondary | ICD-10-CM

## 2025-01-12 DIAGNOSIS — F322 Major depressive disorder, single episode, severe without psychotic features: Secondary | ICD-10-CM

## 2025-01-12 DIAGNOSIS — Z0001 Encounter for general adult medical examination with abnormal findings: Secondary | ICD-10-CM | POA: Diagnosis not present

## 2025-01-12 DIAGNOSIS — Z780 Asymptomatic menopausal state: Secondary | ICD-10-CM | POA: Diagnosis not present

## 2025-01-12 DIAGNOSIS — Z0000 Encounter for general adult medical examination without abnormal findings: Secondary | ICD-10-CM

## 2025-01-12 DIAGNOSIS — Z13820 Encounter for screening for osteoporosis: Secondary | ICD-10-CM | POA: Insufficient documentation

## 2025-01-12 DIAGNOSIS — I152 Hypertension secondary to endocrine disorders: Secondary | ICD-10-CM | POA: Diagnosis not present

## 2025-01-12 DIAGNOSIS — F411 Generalized anxiety disorder: Secondary | ICD-10-CM | POA: Diagnosis not present

## 2025-01-12 DIAGNOSIS — E1165 Type 2 diabetes mellitus with hyperglycemia: Secondary | ICD-10-CM | POA: Diagnosis not present

## 2025-01-12 DIAGNOSIS — K219 Gastro-esophageal reflux disease without esophagitis: Secondary | ICD-10-CM | POA: Diagnosis not present

## 2025-01-12 DIAGNOSIS — Z6841 Body Mass Index (BMI) 40.0 and over, adult: Secondary | ICD-10-CM | POA: Diagnosis not present

## 2025-01-12 DIAGNOSIS — Z5320 Procedure and treatment not carried out because of patient's decision for unspecified reasons: Secondary | ICD-10-CM | POA: Insufficient documentation

## 2025-01-12 DIAGNOSIS — E1169 Type 2 diabetes mellitus with other specified complication: Secondary | ICD-10-CM

## 2025-01-12 DIAGNOSIS — J849 Interstitial pulmonary disease, unspecified: Secondary | ICD-10-CM | POA: Insufficient documentation

## 2025-01-12 LAB — POCT CBG (FASTING - GLUCOSE)-MANUAL ENTRY: Glucose Fasting, POC: 131 mg/dL — AB (ref 70–99)

## 2025-01-12 NOTE — Progress Notes (Signed)
 "  Established Patient Office Visit  Subjective:  Patient ID: Taylor Barry, female    DOB: 1946-11-23  Age: 79 y.o. MRN: [id number]  Chief Complaint  Patient presents with   Annual Exam    AWV    Patient is here today for her Medicare AWV. She reports feeling fairly well today. Reports recently having URI but she is feeling back to her baseline today.  She reports having difficulty with her denture Implants. She has gotten her upper dentures repaired but still having difficulty with getting her lower dentures repaired. She reports having to eat soft foods because she has difficulty chewing hard foods. This has been an ongoing issue and she is now seeing a new sports coach in [place].  She reports constant chronic back pain; she has been told she should have surgery but she has had complications with awakening from anesthesia so her orthopedic specialists will not do any more surgeries. She endorses decreased exercise intolerance and has to take break after 15 minutes due to fatigue and difficulty walking; states her back pain feels like a sharp stabbing sensation. She ambulates with a cane to prevent falls. She reports taking 600-800 mg ibuprofen a couple times a day as needed. Reinforced decreased kidney function and to avoid excessive Ibuprofen use. She reports constant shortness of breath. She has ILD. She was recommended to see Rheumatology due to RA but patient does not want to see further specialists at this time or start new medications. Discussed with patient that RA treatment can reduce her inflammation and help reduce her chronic RA pains. She is not on any inhalers at this time for her ILD. Her oxygen saturation is 97% on room air.  Mammogram last completed 05/2024; Dexa scan is due; last completed 2018 and is documented in office old EMR. Last colonoscopy 03/2021. Patient is due for routine blood work today. Will order to be collected today.  PHQ-9 score 13; GAD-7 score 9; 6CIT  score 6. She is currently taking Zoloft  25 mg. She tried increasing Zoloft  50 mg daily for 1 month and caused her to feel off. She does not want to increase her medications at this time nor trial alternative medication. Patient states she will continue to monitor her symptoms and she will notify us  if she needs further medication adjustments. Denies thought and feelings of self harm or harming others.  She reports she does not want to have any further imaging or specialist referrals as she is done seeing all these specialists. She states she would like to continue with her current medications as prescribed and when her time is up, pass away in peace. Discussed with patient her wishes and to discuss with her family her wishes and complete advance directives and appropriate documentation such as MOST and DNR forms.    No other concerns at this time.   Past Medical History:  Diagnosis Date   Allergic rhinitis with postnasal drip    Anxiety and depression    Aortic atherosclerosis    Arthritis    Arthritis, multiple joint involvement    CAD (coronary artery disease)    Complication of anesthesia    has trouble waking up   Dyspnea on exertion    GERD (gastroesophageal reflux disease)    Hair loss    HTN, goal below 150/90    Hyperlipidemia LDL goal <100    Hypertension associated with diabetes (HCC)    Hypoxemia    Idiopathic pulmonary fibrosis (HCC)    Interstitial  lung disease (HCC)    Interstitial lung disease (HCC)    Morbid obesity (HCC)    Obstructive sleep apnea, adult    CPAP   OSA on CPAP    Post herpetic neuralgia    Pulmonary arterial hypertension (HCC)    Right adrenal mass    Type 2 diabetes mellitus (HCC)    Vitamin D deficiency     Past Surgical History:  Procedure Laterality Date   ABDOMINAL HYSTERECTOMY  1968   total   CATARACT EXTRACTION W/PHACO Right 09/22/2024   Procedure: PHACOEMULSIFICATION, CATARACT, WITH IOL INSERTION 7.30 00:45.3;  Surgeon: Myrna Adine Anes, MD;  Location: [facility];  Service: Ophthalmology;  Laterality: Right;   CATARACT EXTRACTION W/PHACO Left 10/06/2024   Procedure: PHACOEMULSIFICATION, CATARACT, WITH IOL INSERTION 4.65 00:36.5;  Surgeon: Myrna Adine Anes, MD;  Location: [facility];  Service: Ophthalmology;  Laterality: Left;   COLONOSCOPY WITH PROPOFOL  N/A 04/15/2021   Procedure: COLONOSCOPY WITH PROPOFOL ;  Surgeon: Jinny Carmine, MD;  Location: [facility];  Service: Endoscopy;  Laterality: N/A;  [facility] - oral meds priority 4   REPLACEMENT TOTAL KNEE Bilateral    Right knee first, Left knee more recent   TYMPANOPLASTY Right 1977    Social History   Socioeconomic History   Marital status: Divorced    Spouse name: Not on file   Number of children: Not on file   Years of education: Not on file   Highest education level: Not on file  Occupational History   Not on file  Tobacco Use   Smoking status: Former    Current packs/day: 0.00    Average packs/day: 0.3 packs/day    Types: Cigarettes    Quit date: 01/26/1992    Years since quitting: 32.9   Smokeless tobacco: Never   Tobacco comments:    Started smoking in the mid 80's    Smoked 0.25 PPD at the heaviest.      Vaping Use   Vaping status: Never Used  Substance and Sexual Activity   Alcohol use: No    Alcohol/week: 0.0 standard drinks of alcohol   Drug use: No   Sexual activity: Never  Other Topics Concern   Not on file  Social History Narrative   Not on file   Social Drivers of Health   Tobacco Use: Medium Risk (01/12/2025)   Patient History    Smoking Tobacco Use: Former    Smokeless Tobacco Use: Never    Passive Exposure: Not on Actuary Strain: Low Risk  (01/01/2024)   Received from [facility]   Overall Financial Resource Strain (CARDIA)    Difficulty of Paying Living Expenses: Not hard at all  Food Insecurity: No Food Insecurity (01/01/2024)   Received from [facility]   Epic    Within the past 12 months, you worried that your food would run out before you got the money to buy more.: Never true    Within the past 12 months, the food you bought just didn't last and you didn't have money to get more.: Never true  Transportation Needs: No Transportation Needs (01/01/2024)   Received from [facility] - Transportation    In the past 12 months, has lack of transportation kept you from medical appointments or from getting medications?: No    Lack of Transportation (Non-Medical): No  Physical Activity: Not on file  Stress: Not on file  Social Connections: Unknown (  05/09/2022)   Received from [facility]   Social Network    Social Network: Not on file  Intimate Partner Violence: Unknown (03/31/2022)   Received from [facility]   HITS    Physically Hurt: Not on file    Insult or Talk Down To: Not on file    Threaten Physical Harm: Not on file    Scream or Curse: Not on file  Depression (PHQ2-9): High Risk (01/12/2025)   Depression (PHQ2-9)    PHQ-2 Score: 13  Alcohol Screen: Not on file  Housing: Low Risk  (01/01/2024)   Received from [facility]   Epic    In the last 12 months, was there a time when you were not able to pay the mortgage or rent on time?: No    In the past 12 months, how many times have you moved where you were living?: 0    At any time in the past 12 months, were you homeless or living in a shelter (including now)?: No  Utilities: Not At Risk (01/01/2024)   Received from [facility] Utilities    Threatened with loss of utilities: No  Health Literacy: Not on file    Family History  Problem Relation Age of Onset   Heart disease Mother    Alcohol abuse Father    Diabetes Daughter    Breast cancer Maternal Aunt    Cancer Brother    Cancer Maternal Grandmother    Cancer Maternal Grandfather     Allergies[1]  Show/hide medication  list[2]  Review of Systems  Constitutional: Negative.  Negative for chills, fever and malaise/fatigue.  HENT: Negative.  Negative for congestion and sore throat.   Eyes: Negative.  Negative for blurred vision and pain.  Respiratory:  Positive for shortness of breath. Negative for cough.   Cardiovascular: Negative.  Negative for chest pain, palpitations and leg swelling.  Gastrointestinal: Negative.  Negative for abdominal pain, blood in stool, constipation, diarrhea, heartburn, melena, nausea and vomiting.  Genitourinary: Negative.  Negative for dysuria, flank pain, frequency and urgency.  Musculoskeletal:  Positive for back pain. Negative for joint pain and myalgias.  Skin: Negative.   Neurological: Negative.  Negative for dizziness, tingling, sensory change, weakness and headaches.  Endo/Heme/Allergies: Negative.   Psychiatric/Behavioral:  Positive for depression. Negative for suicidal ideas. The patient is nervous/anxious.        Objective:   BP 128/80   Pulse 82   Ht 5' 4 (1.626 m)   Wt 246 lb 12.8 oz (111.9 kg)   SpO2 97%   BMI 42.36 kg/m   Vitals:   01/12/25 1126  BP: 128/80  Pulse: 82  Height: 5' 4 (1.626 m)  Weight: 246 lb 12.8 oz (111.9 kg)  SpO2: 97%  BMI (Calculated): 42.34    Physical Exam Vitals and nursing note reviewed.  Constitutional:      Appearance: Normal appearance.  HENT:     Head: Normocephalic and atraumatic.     Nose: Nose normal.     Mouth/Throat:     Mouth: Mucous membranes are moist.     Pharynx: Oropharynx is clear.  Eyes:     Conjunctiva/sclera: Conjunctivae normal.     Pupils: Pupils are equal, round, and reactive to light.  Cardiovascular:     Rate and Rhythm: Normal rate and regular rhythm.     Pulses: Normal pulses.     Heart sounds: Normal heart sounds. No murmur heard. Pulmonary:  Effort: Pulmonary effort is normal.     Breath sounds: Normal breath sounds. No wheezing.  Abdominal:     General: Bowel sounds are  normal.     Palpations: Abdomen is soft.     Tenderness: There is no abdominal tenderness. There is no right CVA tenderness or left CVA tenderness.  Musculoskeletal:        General: Normal range of motion.     Cervical back: Normal range of motion.     Right lower leg: No edema.     Left lower leg: No edema.  Skin:    General: Skin is warm and dry.  Neurological:     General: No focal deficit present.     Mental Status: She is alert and oriented to person, place, and time.  Psychiatric:        Mood and Affect: Mood normal.        Behavior: Behavior normal.      Results for orders placed or performed in visit on 01/12/25  POCT CBG (Fasting - Glucose)  Result Value Ref Range   Glucose Fasting, POC 131 (A) 70 - 99 mg/dL    Recent Results (from the past 2160 hours)  Hemoglobin A1c     Status: Abnormal   Collection Time: 10/15/24  8:47 AM  Result Value Ref Range   Hgb A1c MFr Bld 6.2 (H) 4.8 - 5.6 %    Comment:          Prediabetes: 5.7 - 6.4          Diabetes: >6.4          Glycemic control for adults with diabetes: <7.0    Est. average glucose Bld gHb Est-mCnc 131 mg/dL  Lipid Panel w/o Chol/HDL Ratio     Status: Abnormal   Collection Time: 10/15/24  8:47 AM  Result Value Ref Range   Cholesterol, Total 165 100 - 199 mg/dL   Triglycerides 721 (H) 0 - 149 mg/dL   HDL 19 (L) >60 mg/dL   VLDL Cholesterol Cal 48 (H) 5 - 40 mg/dL   LDL Chol Calc (NIH) 98 0 - 99 mg/dL  RFE85+ZHQM     Status: Abnormal   Collection Time: 10/15/24  8:47 AM  Result Value Ref Range   Glucose 94 70 - 99 mg/dL   BUN 18 8 - 27 mg/dL   Creatinine, Ser 8.92 (H) 0.57 - 1.00 mg/dL   eGFR 53 (L) >40 fO/fpw/8.26   BUN/Creatinine Ratio 17 12 - 28   Sodium 139 134 - 144 mmol/L   Potassium 5.3 (H) 3.5 - 5.2 mmol/L   Chloride 104 96 - 106 mmol/L   CO2 CANCELED mmol/L    Comment: [facility] was unable to collect sufficient specimen to perform the following test(s), and is providing the patient with  re-collection instructions.  Result canceled by the ancillary.    Calcium 10.5 (H) 8.7 - 10.3 mg/dL   Total Protein 7.2 6.0 - 8.5 g/dL   Albumin 4.5 3.8 - 4.8 g/dL   Globulin, Total 2.7 1.5 - 4.5 g/dL   Bilirubin Total 0.6 0.0 - 1.2 mg/dL   Alkaline Phosphatase 70 49 - 135 IU/L   AST 25 0 - 40 IU/L   ALT 13 0 - 32 IU/L  CBC with Diff     Status: Abnormal   Collection Time: 10/15/24  8:47 AM  Result Value Ref Range   WBC 5.0 3.4 - 10.8 x10E3/uL   RBC 3.71 (L) 3.77 - 5.28  x10E6/uL   Hemoglobin 12.2 11.1 - 15.9 g/dL   Hematocrit 62.7 65.9 - 46.6 %   MCV 100 (H) 79 - 97 fL   MCH 32.9 26.6 - 33.0 pg   MCHC 32.8 31.5 - 35.7 g/dL   RDW 87.1 88.2 - 84.5 %   Platelets 262 150 - 450 x10E3/uL   Neutrophils 54 Not Estab. %   Lymphs 36 Not Estab. %   Monocytes 6 Not Estab. %   Eos 3 Not Estab. %   Basos 1 Not Estab. %   Neutrophils Absolute 2.7 1.4 - 7.0 x10E3/uL   Lymphocytes Absolute 1.8 0.7 - 3.1 x10E3/uL   Monocytes Absolute 0.3 0.1 - 0.9 x10E3/uL   EOS (ABSOLUTE) 0.2 0.0 - 0.4 x10E3/uL   Basophils Absolute 0.0 0.0 - 0.2 x10E3/uL   Immature Granulocytes 0 Not Estab. %   Immature Grans (Abs) 0.0 0.0 - 0.1 x10E3/uL  POCT CBG (Fasting - Glucose)     Status: Abnormal   Collection Time: 10/16/24  1:34 PM  Result Value Ref Range   Glucose Fasting, POC 123 (A) 70 - 99 mg/dL  Rheumatoid (RA) Factor     Status: None   Collection Time: 10/16/24  2:20 PM  Result Value Ref Range   Rheumatoid fact SerPl-aCnc 10.6 <14.0 IU/mL  CRP (C-Reactive Protein)     Status: None   Collection Time: 10/16/24  2:20 PM  Result Value Ref Range   CRP 1 0 - 10 mg/L  CYCLIC CITRUL PEPTIDE ANTIBODY, IGG/IGA     Status: None   Collection Time: 10/16/24  2:20 PM  Result Value Ref Range   Cyclic Citrullin Peptide Ab 7 0 - 19 units    Comment:                           Negative               <20                           Weak positive      20 - 39                           Moderate positive  40 - 59                            Strong positive        >59   Sed Rate (ESR)     Status: None   Collection Time: 10/16/24  2:20 PM  Result Value Ref Range   Sed Rate 23 0 - 40 mm/hr  POCT CBG (Fasting - Glucose)     Status: Abnormal   Collection Time: 12/12/24  2:32 PM  Result Value Ref Range   Glucose Fasting, POC 100 (A) 70 - 99 mg/dL  POCT CBG (Fasting - Glucose)     Status: Abnormal   Collection Time: 01/12/25 11:33 AM  Result Value Ref Range   Glucose Fasting, POC 131 (A) 70 - 99 mg/dL      Assessment & Plan:  Continue current medications as prescribed. Patient declines further imaging or specialist referrals. Discussed advance directives and completing appropriate forms. Check routine blood work today and FU with patient. Patient declines DEXA scan. Reinforced eating healthy diet and staying active. Reinforced drinking plenty of water  daily. Problem  List Items Addressed This Visit       Cardiovascular and Mediastinum   Hypertension associated with diabetes (HCC)   Relevant Orders   CMP14+EGFR     Respiratory   Interstitial lung disease (HCC)     Digestive   RESOLVED: Esophageal reflux   Relevant Orders   CBC with Diff     Endocrine   Type 2 diabetes mellitus with hyperglycemia, without long-term current use of insulin  (HCC)   Relevant Orders   POCT CBG (Fasting - Glucose) (Completed)   Hemoglobin A1c   Combined hyperlipidemia associated with type 2 diabetes mellitus (HCC)   Relevant Orders   Lipid Panel w/o Chol/HDL Ratio     Other   Medicare annual wellness visit, subsequent - Primary   RESOLVED: Major depressive disorder, single episode, severe (HCC)   GAD (generalized anxiety disorder)   Encounter for osteoporosis screening in asymptomatic postmenopausal patient   Relevant Orders   DG Bone Density   Morbid obesity with body mass index (BMI) of 40.0 to 44.9 in adult [facility])   Bone density scan declined    Return in about 3 months (around 04/12/2025).   Total time spent:  30  minutes. This time includes review of previous notes and results and patient face to face interaction during today's visit.    FERNAND FREDY RAMAN, MD  01/12/2025   This document may have been prepared by [facility] Voice Recognition software and as such may include unintentional dictation errors.     [1]  Allergies Allergen Reactions   Codeine Nausea Only   Hydrocodone -Acetaminophen  Other (See Comments)    Puts me up the wall   Oxycodone Hcl    Oxycodone-Acetaminophen      Hallucination   Prednisolone     Hallucination   Statins   [2]  Outpatient Medications Prior to Visit  Medication Sig   Bempedoic Acid -Ezetimibe (NEXLIZET ) 180-10 MG TABS Take 1 tablet by mouth daily.   cetirizine  (ZYRTEC  ALLERGY) 10 MG tablet Take 1 tablet (10 mg total) by mouth daily.   fenofibrate  (TRICOR ) 145 MG tablet Take 1 tablet (145 mg total) by mouth daily.   glipiZIDE  (GLUCOTROL ) 5 MG tablet TAKE ONE-HALF TABLET BY MOUTH IN THE MORNING , AT NOON, AND AT  BEDTIME   glucose blood (ACCU-CHEK GUIDE TEST) test strip 1 each by Other route in the morning, at noon, and at bedtime. Use as instructed   losartan -hydrochlorothiazide  (HYZAAR) 100-12.5 MG tablet TAKE 1 TABLET BY MOUTH ONCE  DAILY   pantoprazole  (PROTONIX ) 40 MG tablet Take 1 tablet (40 mg total) by mouth daily. (Patient taking differently: Take 40 mg by mouth as needed.)   sertraline  (ZOLOFT ) 25 MG tablet Take 25 mg by mouth daily.   [DISCONTINUED] clotrimazole -betamethasone  (LOTRISONE ) cream Apply 1 Application topically 2 (two) times daily.   [DISCONTINUED] nystatin  powder Apply 1 Application topically 3 (three) times daily.   [DISCONTINUED] sertraline  (ZOLOFT ) 50 MG tablet Take 1 tablet (50 mg total) by mouth daily.   No facility-administered medications prior to visit.   "

## 2025-01-13 LAB — CBC WITH DIFFERENTIAL/PLATELET
Basophils Absolute: 0.1 x10E3/uL (ref 0.0–0.2)
Basos: 1 %
EOS (ABSOLUTE): 0.1 x10E3/uL (ref 0.0–0.4)
Eos: 2 %
Hematocrit: 40.5 % (ref 34.0–46.6)
Hemoglobin: 13.4 g/dL (ref 11.1–15.9)
Immature Grans (Abs): 0 x10E3/uL (ref 0.0–0.1)
Immature Granulocytes: 0 %
Lymphocytes Absolute: 2.7 x10E3/uL (ref 0.7–3.1)
Lymphs: 34 %
MCH: 32.6 pg (ref 26.6–33.0)
MCHC: 33.1 g/dL (ref 31.5–35.7)
MCV: 99 fL — ABNORMAL HIGH (ref 79–97)
Monocytes Absolute: 0.4 x10E3/uL (ref 0.1–0.9)
Monocytes: 6 %
Neutrophils Absolute: 4.6 x10E3/uL (ref 1.4–7.0)
Neutrophils: 57 %
Platelets: 314 x10E3/uL (ref 150–450)
RBC: 4.11 x10E6/uL (ref 3.77–5.28)
RDW: 12 % (ref 11.7–15.4)
WBC: 7.9 x10E3/uL (ref 3.4–10.8)

## 2025-01-13 LAB — CMP14+EGFR
ALT: 16 IU/L (ref 0–32)
AST: 26 IU/L (ref 0–40)
Albumin: 4.7 g/dL (ref 3.8–4.8)
Alkaline Phosphatase: 75 IU/L (ref 49–135)
BUN/Creatinine Ratio: 22 (ref 12–28)
BUN: 26 mg/dL (ref 8–27)
Bilirubin Total: 0.5 mg/dL (ref 0.0–1.2)
CO2: 19 mmol/L — ABNORMAL LOW (ref 20–29)
Calcium: 11.1 mg/dL — ABNORMAL HIGH (ref 8.7–10.3)
Chloride: 101 mmol/L (ref 96–106)
Creatinine, Ser: 1.18 mg/dL — ABNORMAL HIGH (ref 0.57–1.00)
Globulin, Total: 3.1 g/dL (ref 1.5–4.5)
Glucose: 103 mg/dL — ABNORMAL HIGH (ref 70–99)
Potassium: 4.9 mmol/L (ref 3.5–5.2)
Sodium: 139 mmol/L (ref 134–144)
Total Protein: 7.8 g/dL (ref 6.0–8.5)
eGFR: 47 mL/min/1.73 — ABNORMAL LOW

## 2025-01-13 LAB — LIPID PANEL W/O CHOL/HDL RATIO
Cholesterol, Total: 167 mg/dL (ref 100–199)
HDL: 20 mg/dL — ABNORMAL LOW
LDL Chol Calc (NIH): 85 mg/dL (ref 0–99)
Triglycerides: 380 mg/dL — ABNORMAL HIGH (ref 0–149)
VLDL Cholesterol Cal: 62 mg/dL — ABNORMAL HIGH (ref 5–40)

## 2025-01-13 LAB — HEMOGLOBIN A1C
Est. average glucose Bld gHb Est-mCnc: 151 mg/dL
Hgb A1c MFr Bld: 6.9 % — ABNORMAL HIGH (ref 4.8–5.6)

## 2025-01-15 ENCOUNTER — Ambulatory Visit: Admitting: Dermatology

## 2025-01-15 ENCOUNTER — Telehealth: Payer: Self-pay | Admitting: Internal Medicine

## 2025-01-15 NOTE — Telephone Encounter (Signed)
 Patient left VM wanting to ask if it is okay if she starts taking a magnesium supplement as it has many benefits and she thinks it would improve her health. Please advise if okay.

## 2025-01-16 ENCOUNTER — Ambulatory Visit: Payer: Self-pay

## 2025-01-16 DIAGNOSIS — E1165 Type 2 diabetes mellitus with hyperglycemia: Secondary | ICD-10-CM

## 2025-01-20 NOTE — Progress Notes (Signed)
 Patient notified

## 2025-01-21 ENCOUNTER — Other Ambulatory Visit

## 2025-01-21 ENCOUNTER — Ambulatory Visit

## 2025-02-18 ENCOUNTER — Ambulatory Visit: Admitting: Dermatology

## 2025-04-13 ENCOUNTER — Ambulatory Visit: Admitting: Internal Medicine
